# Patient Record
Sex: Male | Born: 1955
Health system: Southern US, Community
[De-identification: ages and names within clinical notes are randomized; demographics above are authoritative.]

## PROBLEM LIST (undated history)

## (undated) DIAGNOSIS — E7800 Pure hypercholesterolemia, unspecified: Secondary | ICD-10-CM

## (undated) DIAGNOSIS — I10 Essential (primary) hypertension: Secondary | ICD-10-CM

## (undated) DIAGNOSIS — Z86010 Personal history of colonic polyps: Secondary | ICD-10-CM

## (undated) DIAGNOSIS — B1920 Unspecified viral hepatitis C without hepatic coma: Secondary | ICD-10-CM

## (undated) DIAGNOSIS — K219 Gastro-esophageal reflux disease without esophagitis: Secondary | ICD-10-CM

## (undated) DIAGNOSIS — I2510 Atherosclerotic heart disease of native coronary artery without angina pectoris: Secondary | ICD-10-CM

## (undated) DIAGNOSIS — I499 Cardiac arrhythmia, unspecified: Secondary | ICD-10-CM

## (undated) DIAGNOSIS — H269 Unspecified cataract: Secondary | ICD-10-CM

## (undated) DIAGNOSIS — M1990 Unspecified osteoarthritis, unspecified site: Secondary | ICD-10-CM

## (undated) DIAGNOSIS — I219 Acute myocardial infarction, unspecified: Secondary | ICD-10-CM

## (undated) HISTORY — DX: Unspecified osteoarthritis, unspecified site: M19.90

## (undated) HISTORY — DX: Gastro-esophageal reflux disease without esophagitis: K21.9

## (undated) HISTORY — PX: CARDIAC CATHETERIZATION: SHX172

## (undated) HISTORY — DX: Unspecified cataract: H26.9

## (undated) HISTORY — PX: CORONARY ARTERY BYPASS GRAFT: SHX141

## (undated) HISTORY — PX: CARDIAC SURGERY: SHX584

## (undated) HISTORY — PX: COLONOSCOPY: SHX174

## (undated) HISTORY — DX: Personal history of colonic polyps: Z86.010

---

## 1998-10-08 HISTORY — PX: CORONARY ARTERY BYPASS GRAFT: SHX141

## 2009-04-18 ENCOUNTER — Emergency Department (HOSPITAL_COMMUNITY): Admission: EM | Admit: 2009-04-18 | Discharge: 2009-04-18 | Payer: Self-pay | Admitting: Family Medicine

## 2009-12-18 ENCOUNTER — Emergency Department (HOSPITAL_COMMUNITY): Admission: EM | Admit: 2009-12-18 | Discharge: 2009-12-18 | Payer: Self-pay | Admitting: Emergency Medicine

## 2011-01-01 LAB — POCT I-STAT, CHEM 8
BUN: 20 mg/dL (ref 6–23)
Calcium, Ion: 1.06 mmol/L — ABNORMAL LOW (ref 1.12–1.32)
Creatinine, Ser: 1.1 mg/dL (ref 0.4–1.5)
Glucose, Bld: 88 mg/dL (ref 70–99)
HCT: 36 % — ABNORMAL LOW (ref 39.0–52.0)
Hemoglobin: 12.2 g/dL — ABNORMAL LOW (ref 13.0–17.0)
Sodium: 141 mEq/L (ref 135–145)
TCO2: 26 mmol/L (ref 0–100)

## 2011-01-01 LAB — CBC: HCT: 35.8 % — ABNORMAL LOW (ref 39.0–52.0)

## 2011-01-01 LAB — POCT CARDIAC MARKERS
CKMB, poc: <1 ng/mL — ABNORMAL LOW (ref 1.0–8.0)
Troponin i, poc: <0.05 ng/mL (ref 0.00–0.09)
Troponin i, poc: <0.05 ng/mL (ref 0.00–0.09)

## 2011-01-14 LAB — BODY FLUID CULTURE: Culture: NO GROWTH

## 2011-01-14 LAB — GRAM STAIN

## 2011-01-14 LAB — SYNOVIAL CELL COUNT + DIFF, W/ CRYSTALS: Monocyte-Macrophage-Synovial Fluid: 17 % — ABNORMAL LOW (ref 50–90)

## 2011-01-14 LAB — URIC ACID: Uric Acid, Serum: 8 mg/dL — ABNORMAL HIGH (ref 4.0–7.8)

## 2013-03-12 ENCOUNTER — Other Ambulatory Visit (HOSPITAL_COMMUNITY): Payer: Self-pay | Admitting: Family Medicine

## 2013-03-12 DIAGNOSIS — N508 Other specified disorders of male genital organs: Secondary | ICD-10-CM

## 2013-03-12 DIAGNOSIS — N5089 Other specified disorders of the male genital organs: Secondary | ICD-10-CM

## 2013-03-13 ENCOUNTER — Ambulatory Visit (HOSPITAL_COMMUNITY)
Admission: RE | Admit: 2013-03-13 | Discharge: 2013-03-13 | Disposition: A | Discharge status: Home / Self Care | Payer: 59 | Source: Ambulatory Visit | Attending: Family Medicine | Admitting: Family Medicine

## 2013-03-13 DIAGNOSIS — N433 Hydrocele, unspecified: Secondary | ICD-10-CM | POA: Insufficient documentation

## 2013-03-13 DIAGNOSIS — N508 Other specified disorders of male genital organs: Secondary | ICD-10-CM | POA: Insufficient documentation

## 2013-03-13 DIAGNOSIS — N5089 Other specified disorders of the male genital organs: Secondary | ICD-10-CM

## 2013-10-29 ENCOUNTER — Encounter: Payer: Self-pay | Admitting: Cardiology

## 2013-12-09 ENCOUNTER — Other Ambulatory Visit: Payer: Self-pay | Admitting: Cardiology

## 2013-12-17 ENCOUNTER — Ambulatory Visit: Payer: 59 | Admitting: Cardiology

## 2014-04-19 ENCOUNTER — Other Ambulatory Visit: Payer: Self-pay | Admitting: Cardiology

## 2014-05-21 ENCOUNTER — Encounter: Payer: Self-pay | Admitting: Radiology

## 2014-05-21 ENCOUNTER — Other Ambulatory Visit: Payer: Self-pay | Admitting: Internal Medicine

## 2014-05-21 ENCOUNTER — Encounter (HOSPITAL_COMMUNITY): Payer: Self-pay | Admitting: Emergency Medicine

## 2014-05-21 ENCOUNTER — Emergency Department (HOSPITAL_COMMUNITY)
Admission: EM | Admit: 2014-05-21 | Discharge: 2014-05-21 | Disposition: A | Discharge status: Home / Self Care | Payer: 59 | Attending: Emergency Medicine | Admitting: Emergency Medicine

## 2014-05-21 ENCOUNTER — Encounter (INDEPENDENT_AMBULATORY_CARE_PROVIDER_SITE_OTHER): Payer: 59

## 2014-05-21 ENCOUNTER — Emergency Department (HOSPITAL_COMMUNITY): Payer: 59

## 2014-05-21 DIAGNOSIS — R001 Bradycardia, unspecified: Secondary | ICD-10-CM

## 2014-05-21 DIAGNOSIS — I493 Ventricular premature depolarization: Secondary | ICD-10-CM

## 2014-05-21 DIAGNOSIS — R42 Dizziness and giddiness: Secondary | ICD-10-CM | POA: Diagnosis present

## 2014-05-21 DIAGNOSIS — Z7982 Long term (current) use of aspirin: Secondary | ICD-10-CM | POA: Insufficient documentation

## 2014-05-21 DIAGNOSIS — R55 Syncope and collapse: Secondary | ICD-10-CM | POA: Insufficient documentation

## 2014-05-21 DIAGNOSIS — E785 Hyperlipidemia, unspecified: Secondary | ICD-10-CM | POA: Diagnosis present

## 2014-05-21 DIAGNOSIS — E7800 Pure hypercholesterolemia, unspecified: Secondary | ICD-10-CM | POA: Diagnosis not present

## 2014-05-21 DIAGNOSIS — I252 Old myocardial infarction: Secondary | ICD-10-CM | POA: Diagnosis not present

## 2014-05-21 DIAGNOSIS — Z79899 Other long term (current) drug therapy: Secondary | ICD-10-CM | POA: Diagnosis not present

## 2014-05-21 DIAGNOSIS — I10 Essential (primary) hypertension: Secondary | ICD-10-CM | POA: Diagnosis not present

## 2014-05-21 DIAGNOSIS — I4949 Other premature depolarization: Secondary | ICD-10-CM | POA: Diagnosis not present

## 2014-05-21 DIAGNOSIS — I498 Other specified cardiac arrhythmias: Secondary | ICD-10-CM | POA: Insufficient documentation

## 2014-05-21 DIAGNOSIS — N2889 Other specified disorders of kidney and ureter: Secondary | ICD-10-CM | POA: Diagnosis present

## 2014-05-21 DIAGNOSIS — I2510 Atherosclerotic heart disease of native coronary artery without angina pectoris: Secondary | ICD-10-CM | POA: Diagnosis present

## 2014-05-21 DIAGNOSIS — N182 Chronic kidney disease, stage 2 (mild): Secondary | ICD-10-CM | POA: Diagnosis present

## 2014-05-21 HISTORY — DX: Acute myocardial infarction, unspecified: I21.9

## 2014-05-21 HISTORY — DX: Essential (primary) hypertension: I10

## 2014-05-21 HISTORY — DX: Pure hypercholesterolemia, unspecified: E78.00

## 2014-05-21 LAB — CBC WITH DIFFERENTIAL/PLATELET
BASOS PCT: 0 % (ref 0–1)
Basophils Absolute: 0 10*3/uL (ref 0.0–0.1)
EOS ABS: 0.2 10*3/uL (ref 0.0–0.7)
EOS PCT: 3 % (ref 0–5)
HEMATOCRIT: 39.2 % (ref 39.0–52.0)
HEMOGLOBIN: 13.7 g/dL (ref 13.0–17.0)
LYMPHS PCT: 42 % (ref 12–46)
Lymphs Abs: 2.4 10*3/uL (ref 0.7–4.0)
MCH: 28.7 pg (ref 26.0–34.0)
MCHC: 34.9 g/dL (ref 30.0–36.0)
MCV: 82.2 fL (ref 78.0–100.0)
Monocytes Absolute: 0.8 10*3/uL (ref 0.1–1.0)
Monocytes Relative: 14 % — ABNORMAL HIGH (ref 3–12)
NEUTROS ABS: 2.3 10*3/uL (ref 1.7–7.7)
NEUTROS PCT: 41 % — AB (ref 43–77)
PLATELETS: 174 10*3/uL (ref 150–400)
RBC: 4.77 MIL/uL (ref 4.22–5.81)
RDW: 13.2 % (ref 11.5–15.5)
WBC: 5.6 10*3/uL (ref 4.0–10.5)

## 2014-05-21 LAB — COMPREHENSIVE METABOLIC PANEL
ALT: 32 U/L (ref 0–53)
ANION GAP: 11 (ref 5–15)
AST: 43 U/L — ABNORMAL HIGH (ref 0–37)
Albumin: 4.2 g/dL (ref 3.5–5.2)
Alkaline Phosphatase: 64 U/L (ref 39–117)
BILIRUBIN TOTAL: 0.5 mg/dL (ref 0.3–1.2)
BUN: 21 mg/dL (ref 6–23)
CHLORIDE: 100 meq/L (ref 96–112)
CO2: 26 mEq/L (ref 19–32)
CREATININE: 1.21 mg/dL (ref 0.50–1.35)
Calcium: 9.7 mg/dL (ref 8.4–10.5)
GFR, EST AFRICAN AMERICAN: 75 mL/min — AB (ref >90)
GFR, EST NON AFRICAN AMERICAN: 65 mL/min — AB (ref >90)
GLUCOSE: 100 mg/dL — AB (ref 70–99)
Potassium: 4.1 mEq/L (ref 3.7–5.3)
Sodium: 137 mEq/L (ref 137–147)
TOTAL PROTEIN: 8.3 g/dL (ref 6.0–8.3)

## 2014-05-21 LAB — RAPID URINE DRUG SCREEN, HOSP PERFORMED
AMPHETAMINES: NOT DETECTED
BARBITURATES: NOT DETECTED
Benzodiazepines: NOT DETECTED
COCAINE: NOT DETECTED
OPIATES: NOT DETECTED
Tetrahydrocannabinol: POSITIVE — AB

## 2014-05-21 LAB — I-STAT TROPONIN, ED: TROPONIN I, POC: 0.01 ng/mL (ref 0.00–0.08)

## 2014-05-21 NOTE — ED Notes (Signed)
Patient placed on defibrilator pads and monitor. Will continue to monitor rhythm and rate.

## 2014-05-21 NOTE — Progress Notes (Signed)
Patient ID: Nicholas [name], male   DOB: [birth year], 58 y.o.   MRN: [id number] lifewatch 30 day monitor applied. EOS 06-20-14

## 2014-05-21 NOTE — ED Provider Notes (Signed)
CSN: [id number]     Arrival date & time 05/21/14  0830 History   First MD Initiated Contact with Patient 05/21/14 [phone]     Chief Complaint  Patient presents with  . Dizziness     (Consider location/radiation/quality/duration/timing/severity/associated sxs/prior Treatment) The history is provided by the patient.  Nicholas [name] is a 58 y.o. male hx of MI, HTN, HL here with dizziness. He bends down often this morning as part of his job. He noticed that when he bends down, he feels light headed and dizzy and almost passed out. Had some left sided chest pressure for the last 2 days, no pain today. No shortness of breath. No vertigo symptoms. Smoked some marijuana today. Denies cocaine use. Had MI x 2 with CABG.    Past Medical History  Diagnosis Date  . Myocardial infarction   . Hypertension   . Hypercholesteremia    Past Surgical History  Procedure Laterality Date  . Cardiac surgery      triple bypass   No family history on file. History  Substance Use Topics  . Smoking status: Never Smoker   . Smokeless tobacco: Not on file  . Alcohol Use: Yes     Comment: social    Review of Systems  Cardiovascular: Positive for chest pain.  Neurological: Positive for dizziness.  All other systems reviewed and are negative.     Allergies  Review of patient's allergies indicates no known allergies.  Home Medications   Prior to Admission medications   Medication Sig Start Date End Date Taking? Authorizing Provider  alfuzosin (UROXATRAL) 10 MG 24 hr tablet Take 10 mg by mouth every evening.   Yes Historical Provider, MD  aspirin EC 81 MG tablet Take 81 mg by mouth daily with breakfast.   Yes Historical Provider, MD  enalapril (VASOTEC) 10 MG tablet Take 5 mg by mouth daily.   Yes Historical Provider, MD  hydrochlorothiazide (HYDRODIURIL) 25 MG tablet Take 12.5 mg by mouth daily.   Yes Historical Provider, MD  simvastatin (ZOCOR) 40 MG tablet Take 40 mg by mouth every evening.   Yes  Historical Provider, MD   BP 115/78  Pulse 36  Temp(Src) 97.5 F (36.4 C)  Resp 16  SpO2 99% Physical Exam  Nursing note and vitals reviewed. Constitutional: He is oriented to person, place, and time. He appears well-developed and well-nourished.  HENT:  Head: Normocephalic.  Mouth/Throat: Oropharynx is clear and moist.  Eyes: Conjunctivae and EOM are normal. Pupils are equal, round, and reactive to light.  Neck: Normal range of motion. Neck supple.  Cardiovascular: Regular rhythm and normal heart sounds.   Bradycardic   Pulmonary/Chest: Effort normal and breath sounds normal. No respiratory distress. He has no wheezes. He has no rales.  Abdominal: Soft. Bowel sounds are normal. He exhibits no distension. There is no tenderness. There is no rebound and no guarding.  Musculoskeletal: Normal range of motion. He exhibits no edema and no tenderness.  Neurological: He is alert and oriented to person, place, and time. No cranial nerve deficit. Coordination normal.  Skin: Skin is warm and dry.  Psychiatric: He has a normal mood and affect. His behavior is normal. Judgment and thought content normal.    ED Course  Procedures (including critical care time) Labs Review Labs Reviewed  CBC WITH DIFFERENTIAL - Abnormal; Notable for the following:    Neutrophils Relative % 41 (*)    Monocytes Relative 14 (*)    All other components within normal limits  COMPREHENSIVE METABOLIC PANEL - Abnormal; Notable for the following:    Glucose, Bld 100 (*)    AST 43 (*)    GFR calc non Af Amer 65 (*)    GFR calc Af Amer 75 (*)    All other components within normal limits  URINE RAPID DRUG SCREEN (HOSP PERFORMED) - Abnormal; Notable for the following:    Tetrahydrocannabinol POSITIVE (*)    All other components within normal limits  I-STAT TROPOININ, ED    Imaging Review Dg Chest 2 View  05/21/2014   CLINICAL DATA:  Dizziness.  EXAM: CHEST  2 VIEW  COMPARISON:  12/18/2009  FINDINGS: Heart size  and pulmonary vascularity are normal and the lungs are clear. No effusions. No osseous abnormality. CABG.  IMPRESSION: No active cardiopulmonary disease.   Electronically Signed   By: Rozetta Nunnery M.D.   On: 05/21/2014 09:55     EKG Interpretation   Date/Time:  Friday May 21 2014 08:51:54 EDT Ventricular Rate:  96 PR Interval:  168 QRS Duration: 121 QT Interval:  409 QTC Calculation: 517 R Axis:   48 Text Interpretation:  Sinus rhythm Paired ventricular premature complexes  Biatrial enlargement Nonspecific intraventricular conduction delay  Nonspecific T abnormalities, inferior leads Baseline wander in lead(s) V1  V3 PVCs new since previous  Confirmed by Ameya Vowell  MD, Sahily Biddle ([id number]) on  05/21/2014 8:54:38 AM      MDM   Final diagnoses:  None   Nicholas [name] is a 58 y.o. male here with dizziness. Noted to be bradycardic. I am concerned for ACS vs symptomatic bradycardic given hx of MI. Will likely need cardiology eval and admission. Not hypotensive so will hold off on meds for bradycardia.   10:42 AM Placed on pacer pads. Labs unremarkable. Trop neg x 1. UDS + marijuana. Consulted cardiology for eval for ACS vs symptomatic bradycardia.   2:58 PM Cardiology at bedside, will admit.    Wandra Arthurs, MD 05/21/14 [phone]

## 2014-05-21 NOTE — ED Notes (Signed)
Dr. Darl Householder notified that patient is waiting for cardiology consult.

## 2014-05-21 NOTE — ED Notes (Signed)
Patient waiting on cardiology to consult.

## 2014-05-21 NOTE — H&P (Signed)
Patient ID: Nicholas [name] MRN: [id number], DOB/AGE: 06-11-1956   Admit date: 05/21/2014   Primary Physician: Donnie Coffin, MD Primary Cardiologist: Dr Marlou Porch  HPI: Pleasant 58 y/o AA male, s/p CAG x 3 in [place] in 2003. He has been followed by Dr Marlou Porch since 2008. He sees him on a regular basis, last saw him about 6 months ago (although there are no records in Epic?). The pt says he had a stress test a year ago that was OK. He came to the ER today after he became dizzy when bending over. This was new for him. He also had some Lt lateral, localized chest pain. In the ER his HR is in the 50s and he has frequent PVCs, bigeminy, and couplets. He denies syncope. He tells me he has always had an "irregular HR".    Problem List: Past Medical History  Diagnosis Date  . Myocardial infarction   . Hypertension   . Hypercholesteremia     Past Surgical History  Procedure Laterality Date  . Cardiac surgery      triple bypass  . Coronary artery bypass graft      CABG x 3 Vessels     Allergies: No Known Allergies   Home Medications No current facility-administered medications for this encounter.   Current Outpatient Prescriptions  Medication Sig Dispense Refill  . alfuzosin (UROXATRAL) 10 MG 24 hr tablet Take 10 mg by mouth every evening.      Marland Kitchen aspirin EC 81 MG tablet Take 81 mg by mouth daily with breakfast.      . enalapril (VASOTEC) 10 MG tablet Take 5 mg by mouth daily.      . hydrochlorothiazide (HYDRODIURIL) 25 MG tablet Take 12.5 mg by mouth daily.      . simvastatin (ZOCOR) 40 MG tablet Take 40 mg by mouth every evening.         Family History  Problem Relation Age of Onset  . Diabetic kidney disease Mother   . Kidney disease Mother   . Throat cancer Mother   . Other Father     unknown     History   Social History  . Marital Status: Married    Spouse Name: N/A    Number of Children: N/A  . Years of Education: N/A   Occupational History  . Not on  file.   Social History Main Topics  . Smoking status: Never Smoker   . Smokeless tobacco: Never Used  . Alcohol Use: Yes     Comment: social  . Drug Use: Yes    Special: Marijuana  . Sexual Activity: Yes   Other Topics Concern  . Not on file   Social History Narrative  . No narrative on file     Review of Systems: General: negative for chills, fever, night sweats or weight changes.  Cardiovascular: negative for chest pain, dyspnea on exertion, edema, orthopnea, palpitations, paroxysmal nocturnal dyspnea or shortness of breath Dermatological: negative for rash Respiratory: negative for cough or wheezing Urologic: negative for hematuria Abdominal: negative for nausea, vomiting, diarrhea, bright red blood per rectum, melena, or hematemesis Neurologic: negative for visual changes, syncope, or dizziness All other systems reviewed and are otherwise negative except as noted above.  Physical Exam: Blood pressure 118/79, pulse 36, temperature 97.5 F (36.4 C), resp. rate 16, SpO2 100.00%.  General appearance: alert, cooperative and no distress Neck: no carotid bruit and no JVD Lungs: clear to auscultation bilaterally Heart: regular rate and rhythm,  S1, S2 normal, no murmur, click, rub or gallop Abdomen: soft, non-tender; bowel sounds normal; no masses,  no organomegaly Extremities: extremities normal, atraumatic, no cyanosis or edema Pulses: 2+ and symmetric Skin: Skin color, texture, turgor normal. No rashes or lesions Neurologic: Grossly normal    Labs:   Results for orders placed during the hospital encounter of 05/21/14 (from the past 24 hour(s))  CBC WITH DIFFERENTIAL     Status: Abnormal   Collection Time    05/21/14  8:49 AM      Result Value Ref Range   WBC 5.6  4.0 - 10.5 K/uL   RBC 4.77  4.22 - 5.81 MIL/uL   Hemoglobin 13.7  13.0 - 17.0 g/dL   HCT 39.2  39.0 - 52.0 %   MCV 82.2  78.0 - 100.0 fL   MCH 28.7  26.0 - 34.0 pg   MCHC 34.9  30.0 - 36.0 g/dL   RDW  13.2  11.5 - 15.5 %   Platelets 174  150 - 400 K/uL   Neutrophils Relative % 41 (*) 43 - 77 %   Neutro Abs 2.3  1.7 - 7.7 K/uL   Lymphocytes Relative 42  12 - 46 %   Lymphs Abs 2.4  0.7 - 4.0 K/uL   Monocytes Relative 14 (*) 3 - 12 %   Monocytes Absolute 0.8  0.1 - 1.0 K/uL   Eosinophils Relative 3  0 - 5 %   Eosinophils Absolute 0.2  0.0 - 0.7 K/uL   Basophils Relative 0  0 - 1 %   Basophils Absolute 0.0  0.0 - 0.1 K/uL  COMPREHENSIVE METABOLIC PANEL     Status: Abnormal   Collection Time    05/21/14  8:49 AM      Result Value Ref Range   Sodium 137  137 - 147 mEq/L   Potassium 4.1  3.7 - 5.3 mEq/L   Chloride 100  96 - 112 mEq/L   CO2 26  19 - 32 mEq/L   Glucose, Bld 100 (*) 70 - 99 mg/dL   BUN 21  6 - 23 mg/dL   Creatinine, Ser 1.21  0.50 - 1.35 mg/dL   Calcium 9.7  8.4 - 10.5 mg/dL   Total Protein 8.3  6.0 - 8.3 g/dL   Albumin 4.2  3.5 - 5.2 g/dL   AST 43 (*) 0 - 37 U/L   ALT 32  0 - 53 U/L   Alkaline Phosphatase 64  39 - 117 U/L   Total Bilirubin 0.5  0.3 - 1.2 mg/dL   GFR calc non Af Amer 65 (*) >90 mL/min   GFR calc Af Amer 75 (*) >90 mL/min   Anion gap 11  5 - 15  URINE RAPID DRUG SCREEN (HOSP PERFORMED)     Status: Abnormal   Collection Time    05/21/14  9:37 AM      Result Value Ref Range   Opiates NONE DETECTED  NONE DETECTED   Cocaine NONE DETECTED  NONE DETECTED   Benzodiazepines NONE DETECTED  NONE DETECTED   Amphetamines NONE DETECTED  NONE DETECTED   Tetrahydrocannabinol POSITIVE (*) NONE DETECTED   Barbiturates NONE DETECTED  NONE DETECTED  I-STAT TROPOININ, ED     Status: None   Collection Time    05/21/14 10:04 AM      Result Value Ref Range   Troponin i, poc 0.01  0.00 - 0.08 ng/mL   Comment 3  Radiology/Studies: Dg Chest 2 View  05/21/2014   CLINICAL DATA:  Dizziness.  EXAM: CHEST  2 VIEW  COMPARISON:  12/18/2009  FINDINGS: Heart size and pulmonary vascularity are normal and the lungs are clear. No effusions. No osseous abnormality.  CABG.  IMPRESSION: No active cardiopulmonary disease.   Electronically Signed   By: Rozetta Nunnery M.D.   On: 05/21/2014 09:55    EKG:NSR, SB, PVCs, Bigeminy, couplets  ASSESSMENT AND PLAN:  Active Problems:   Dizziness when bending over   Frequent PVCs   Sinus bradycardia   CAD- CABG x 3 in [place] '03   Chronic renal insufficiency, stage II (mild)   Dyslipidemia   PLAN: Troponin negative x 1. He feels fine now. He is not on a beta blocker. B/P was not orthostatic. ? Home with OP event. I'm not sure why there are no records in Epic from office visits or OP testing. MD to see.     Henri Medal, PA-C 05/21/2014, 2:42 PM

## 2014-05-21 NOTE — ED Notes (Addendum)
Pt from home c/o dizziness when he bends over. Pt reports hx of MI in 2003. Pt states that he has been SOB since MI, but denies CP. Pt also denies N/V/D/F. Pt is A&O and in NAD. Pt has manual HR of 36, is asymptomatic and reports that he "smoked a joint this morning." Pt also adds that when he is dehydrated, he feels dizzy

## 2014-05-21 NOTE — Discharge Instructions (Signed)
Bradycardia Bradycardia is a term for a heart rate (pulse) that, in adults, is slower than 60 beats per minute. A normal rate is 60 to 100 beats per minute. A heart rate below 60 beats per minute may be normal for some adults with healthy hearts. If the rate is too slow, the heart may have trouble pumping the volume of blood the body needs. If the heart rate gets too low, blood flow to the brain may be decreased and may make you feel lightheaded, dizzy, or faint. The heart has a natural pacemaker in the top of the heart called the SA node (sinoatrial or sinus node). This pacemaker sends out regular electrical signals to the muscle of the heart, telling the heart muscle when to beat (contract). The electrical signal travels from the upper parts of the heart (atria) through the AV node (atrioventricular node), to the lower chambers of the heart (ventricles). The ventricles squeeze, pumping the blood from your heart to your lungs and to the rest of your body. CAUSES   Problem with the heart's electrical system.  Problem with the heart's natural pacemaker.  Heart disease, damage, or infection.  Medications.  Problems with minerals and salts (electrolytes). SYMPTOMS   Fainting (syncope).  Fatigue and weakness.  Shortness of breath (dyspnea).  Chest pain (angina).  Drowsiness.  Confusion. DIAGNOSIS   An electrocardiogram (ECG) can help your caregiver determine the type of slow heart rate you have.  If the cause is not seen on an ECG, you may need to wear a heart monitor that records your heart rhythm for several hours or days.  Blood tests. TREATMENT   Electrolyte supplements.  Medications.  Withholding medication which is causing a slow heart rate.  Pacemaker placement. SEEK IMMEDIATE MEDICAL CARE IF:   You feel lightheaded or faint.  You develop an irregular heart rate.  You feel chest pain or have trouble breathing. MAKE SURE YOU:   Understand these  instructions.  Will watch your condition.  Will get help right away if you are not doing well or get worse. Document Released: 06/16/2002 Document Revised: 12/17/2011 Document Reviewed: 12/30/2013 [facility] Patient Information 2015 [place], [place]. This information is not intended to replace advice given to you by your health care provider. Make sure you discuss any questions you have with your health care provider.   [place] [id number] [place] [address] Phone: [phone] Fax: [phone]  May 21, 2014  Patient: Nicholas [name]  Date of Birth: [birth year]  Date of Visit: 05/21/2014    To Whom It May Concern:  Abb Gobert was seen and treated in our emergency department on 05/21/2014. [name]  may return to work on 05/24/14.  Sincerely,

## 2014-05-21 NOTE — ED Provider Notes (Signed)
Dr Debara Pickett evaluated the patient. Pt will be followed up as an outpatient.  Safe for discharge.  Dorie Rank, MD 05/21/14 [phone]

## 2014-05-21 NOTE — H&P (Signed)
Pt. Seen and examined. Agree with the NP/PA-C note as written. Pleasant 58 yo male with history of CABG x 3 in [place] in 2003. He has followed with Dr. Marlou Porch, but has not seen him since he moved to the [address] office. Today he noted some dizziness with change in position and fatigue at work. Apparently he used marijuana this morning. EKG on arrival shows bradycardia with HR in the upper 40's-60's with some PVC's.  He denies chest pain. Initial troponin is negative. He is not on any AVN blocking agents. It is not know what his baseline HR is.   His symptoms could be due to bradycardia or chronotropic incompetence. Unclear if this is related to ischemia, but he is not having chest pain and has a negative troponin. I would recommend a 2 week event monitor.  I have called the [facility] street office and arranged that to be placed today. I have scheduled a follow-up appointment with the next available provider Kerin Ransom) on 9/2 at 8 am.  He would likely need an outpatient stress test to rule-out ischemia - an exercise myoview would also help to evaluate for chronotropic incompetence.  Pixie Casino, MD, [facility] Attending Cardiologist [place]

## 2014-05-21 NOTE — ED Notes (Signed)
Dr. Darl Householder notified that patient is waiting on cardiology.

## 2014-05-21 NOTE — ED Notes (Signed)
Pt. Is not able to use the restroom at this but is aware that we need a urine specimen.

## 2014-05-21 NOTE — ED Notes (Signed)
Provided pt with Sprite - Per Dr Darl Householder pt can eat/drink

## 2014-05-24 ENCOUNTER — Other Ambulatory Visit: Payer: Self-pay | Admitting: Cardiology

## 2014-05-26 ENCOUNTER — Telehealth: Payer: Self-pay | Admitting: Cardiology

## 2014-05-26 NOTE — Telephone Encounter (Signed)
Walk In Pt Form " FMLA" Dropped Off gave to Pam 8.20.15/km

## 2014-05-27 ENCOUNTER — Telehealth: Payer: Self-pay | Admitting: Cardiology

## 2014-05-27 DIAGNOSIS — I498 Other specified cardiac arrhythmias: Secondary | ICD-10-CM

## 2014-05-27 DIAGNOSIS — I25810 Atherosclerosis of coronary artery bypass graft(s) without angina pectoris: Secondary | ICD-10-CM

## 2014-05-27 NOTE — Telephone Encounter (Signed)
F/u    Calling about previous message.

## 2014-05-27 NOTE — Telephone Encounter (Signed)
New problem   Pt was seen last week and pt not feeling well today and want to come in to see doctor today b/c of an almost fainting spell at work last night.

## 2014-05-27 NOTE — Telephone Encounter (Signed)
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  Pt is wearing an event monitor and tracing from this am demonstrated NSR with 1 PVC.  Will review with Dr Marlou Porch tomorrow.  Per wife - pt is to be seen sooner than his scheduled appt (06/10/2014) if there is a cancellation.

## 2014-05-28 NOTE — Telephone Encounter (Signed)
Left message for pt to call back - per Dr Marlou Porch he should have a stress myoview.

## 2014-05-31 NOTE — Telephone Encounter (Signed)
Left message with pt's wife to have him call back to discuss need for stress testing, instructions and to schedule.

## 2014-05-31 NOTE — Telephone Encounter (Signed)
Follow up ° ° ° ° ° ° ° ° ° °Pt returning nurses call °

## 2014-05-31 NOTE — Telephone Encounter (Signed)
Reviewed need for and instructions for myoview stress test as ordered by Dr Marlou Porch.  Pt states understanding.  Aware I will have the scheduler to call him with an appointment date and time.

## 2014-06-01 ENCOUNTER — Encounter: Payer: Self-pay | Admitting: *Deleted

## 2014-06-04 ENCOUNTER — Telehealth: Payer: Self-pay | Admitting: *Deleted

## 2014-06-04 NOTE — Telephone Encounter (Signed)
Noted! Thank you

## 2014-06-04 NOTE — Telephone Encounter (Signed)
Several message were left on 8/26,8/27 and 06/04/2014 for  Nicholas [name] to call and schedule his test.

## 2014-06-08 DIAGNOSIS — I499 Cardiac arrhythmia, unspecified: Secondary | ICD-10-CM

## 2014-06-08 HISTORY — DX: Cardiac arrhythmia, unspecified: I49.9

## 2014-06-09 ENCOUNTER — Telehealth: Payer: Self-pay | Admitting: Interventional Cardiology

## 2014-06-09 ENCOUNTER — Ambulatory Visit: Payer: 59 | Admitting: Cardiology

## 2014-06-09 NOTE — Telephone Encounter (Signed)
Dr.Skains pt routed to his nurse Jeannene Patella F.,RN

## 2014-06-09 NOTE — Telephone Encounter (Signed)
New Message  Pt wife called back to discuss FMLA forms. Please call

## 2014-06-10 ENCOUNTER — Encounter: Payer: Self-pay | Admitting: Physician Assistant

## 2014-06-10 ENCOUNTER — Ambulatory Visit: Payer: 59 | Admitting: Physician Assistant

## 2014-06-10 ENCOUNTER — Ambulatory Visit (INDEPENDENT_AMBULATORY_CARE_PROVIDER_SITE_OTHER): Payer: 59 | Admitting: Physician Assistant

## 2014-06-10 ENCOUNTER — Encounter (HOSPITAL_COMMUNITY): Payer: Self-pay | Admitting: General Practice

## 2014-06-10 ENCOUNTER — Observation Stay (HOSPITAL_COMMUNITY)
Admission: AD | Admit: 2014-06-10 | Discharge: 2014-06-12 | Disposition: A | Discharge status: Home / Self Care | Payer: 59 | Source: Ambulatory Visit | Attending: Cardiology | Admitting: Cardiology

## 2014-06-10 VITALS — BP 110/68 | HR 66 | Ht 71.0 in | Wt 177.0 lb

## 2014-06-10 DIAGNOSIS — R55 Syncope and collapse: Secondary | ICD-10-CM

## 2014-06-10 DIAGNOSIS — F1210 Cannabis abuse, uncomplicated: Secondary | ICD-10-CM | POA: Diagnosis not present

## 2014-06-10 DIAGNOSIS — N2889 Other specified disorders of kidney and ureter: Secondary | ICD-10-CM | POA: Diagnosis present

## 2014-06-10 DIAGNOSIS — E7800 Pure hypercholesterolemia, unspecified: Secondary | ICD-10-CM | POA: Diagnosis not present

## 2014-06-10 DIAGNOSIS — Z87891 Personal history of nicotine dependence: Secondary | ICD-10-CM | POA: Insufficient documentation

## 2014-06-10 DIAGNOSIS — E785 Hyperlipidemia, unspecified: Secondary | ICD-10-CM | POA: Diagnosis present

## 2014-06-10 DIAGNOSIS — I252 Old myocardial infarction: Secondary | ICD-10-CM | POA: Insufficient documentation

## 2014-06-10 DIAGNOSIS — N182 Chronic kidney disease, stage 2 (mild): Secondary | ICD-10-CM | POA: Diagnosis not present

## 2014-06-10 DIAGNOSIS — R0989 Other specified symptoms and signs involving the circulatory and respiratory systems: Secondary | ICD-10-CM

## 2014-06-10 DIAGNOSIS — R001 Bradycardia, unspecified: Secondary | ICD-10-CM | POA: Diagnosis present

## 2014-06-10 DIAGNOSIS — R42 Dizziness and giddiness: Secondary | ICD-10-CM | POA: Diagnosis present

## 2014-06-10 DIAGNOSIS — Z7982 Long term (current) use of aspirin: Secondary | ICD-10-CM | POA: Diagnosis not present

## 2014-06-10 DIAGNOSIS — I2510 Atherosclerotic heart disease of native coronary artery without angina pectoris: Secondary | ICD-10-CM | POA: Diagnosis not present

## 2014-06-10 DIAGNOSIS — Z79899 Other long term (current) drug therapy: Secondary | ICD-10-CM | POA: Insufficient documentation

## 2014-06-10 DIAGNOSIS — I129 Hypertensive chronic kidney disease with stage 1 through stage 4 chronic kidney disease, or unspecified chronic kidney disease: Secondary | ICD-10-CM | POA: Insufficient documentation

## 2014-06-10 DIAGNOSIS — I498 Other specified cardiac arrhythmias: Secondary | ICD-10-CM | POA: Diagnosis not present

## 2014-06-10 DIAGNOSIS — Z951 Presence of aortocoronary bypass graft: Secondary | ICD-10-CM | POA: Insufficient documentation

## 2014-06-10 DIAGNOSIS — R0609 Other forms of dyspnea: Secondary | ICD-10-CM

## 2014-06-10 DIAGNOSIS — I4949 Other premature depolarization: Secondary | ICD-10-CM | POA: Diagnosis not present

## 2014-06-10 DIAGNOSIS — I493 Ventricular premature depolarization: Secondary | ICD-10-CM | POA: Diagnosis present

## 2014-06-10 HISTORY — DX: Cardiac arrhythmia, unspecified: I49.9

## 2014-06-10 HISTORY — DX: Atherosclerotic heart disease of native coronary artery without angina pectoris: I25.10

## 2014-06-10 LAB — BASIC METABOLIC PANEL
Anion gap: 13 (ref 5–15)
BUN: 17 mg/dL (ref 6–23)
CALCIUM: 9.2 mg/dL (ref 8.4–10.5)
CO2: 23 mEq/L (ref 19–32)
CREATININE: 1.09 mg/dL (ref 0.50–1.35)
Chloride: 103 mEq/L (ref 96–112)
GFR, EST AFRICAN AMERICAN: 85 mL/min — AB (ref >90)
GFR, EST NON AFRICAN AMERICAN: 73 mL/min — AB (ref >90)
Glucose, Bld: 103 mg/dL — ABNORMAL HIGH (ref 70–99)
Potassium: 4.5 mEq/L (ref 3.7–5.3)
Sodium: 139 mEq/L (ref 137–147)

## 2014-06-10 LAB — CBC
HCT: 39.7 % (ref 39.0–52.0)
HEMOGLOBIN: 14 g/dL (ref 13.0–17.0)
MCH: 28.9 pg (ref 26.0–34.0)
MCHC: 35.3 g/dL (ref 30.0–36.0)
MCV: 81.9 fL (ref 78.0–100.0)
Platelets: 149 10*3/uL — ABNORMAL LOW (ref 150–400)
RBC: 4.85 MIL/uL (ref 4.22–5.81)
RDW: 13.1 % (ref 11.5–15.5)
WBC: 6.2 10*3/uL (ref 4.0–10.5)

## 2014-06-10 LAB — APTT: aPTT: 32 seconds (ref 24–37)

## 2014-06-10 LAB — PRO B NATRIURETIC PEPTIDE: PRO B NATRI PEPTIDE: 175.8 pg/mL — AB (ref 0–125)

## 2014-06-10 LAB — TROPONIN I

## 2014-06-10 LAB — MAGNESIUM: Magnesium: 2.2 mg/dL (ref 1.5–2.5)

## 2014-06-10 LAB — PROTIME-INR
INR: 1.03 (ref 0.00–1.49)
PROTHROMBIN TIME: 13.5 s (ref 11.6–15.2)

## 2014-06-10 LAB — TSH: TSH: 1.32 u[IU]/mL (ref 0.350–4.500)

## 2014-06-10 LAB — HEMOGLOBIN A1C
Hgb A1c MFr Bld: 6.4 % — ABNORMAL HIGH (ref <5.7)
Mean Plasma Glucose: 137 mg/dL — ABNORMAL HIGH (ref <117)

## 2014-06-10 MED ORDER — ASPIRIN EC 81 MG PO TBEC: 81.0000 mg | DELAYED_RELEASE_TABLET | Freq: Every day | ORAL | Status: DC

## 2014-06-10 MED ORDER — SODIUM CHLORIDE 0.9 % IV SOLN: 250.0000 mL | INTRAVENOUS | Status: DC | PRN

## 2014-06-10 MED ORDER — ALFUZOSIN HCL ER 10 MG PO TB24
10.0000 mg | ORAL_TABLET | Freq: Every evening | ORAL | Status: DC
  Administered 2014-06-10 – 2014-06-11 (×2): 10 mg via ORAL
  Filled 2014-06-10 (×3): qty 1

## 2014-06-10 MED ORDER — HEPARIN SODIUM (PORCINE) 5000 UNIT/ML IJ SOLN
5000.0000 [IU] | Freq: Three times a day (TID) | INTRAMUSCULAR | Status: DC
  Administered 2014-06-10 – 2014-06-12 (×5): 5000 [IU] via SUBCUTANEOUS
  Filled 2014-06-10 (×8): qty 1

## 2014-06-10 MED ORDER — SODIUM CHLORIDE 0.9 % IJ SOLN: 3.0000 mL | INTRAMUSCULAR | Status: DC | PRN

## 2014-06-10 MED ORDER — ONDANSETRON HCL 4 MG/2ML IJ SOLN: 4.0000 mg | Freq: Four times a day (QID) | INTRAMUSCULAR | Status: DC | PRN

## 2014-06-10 MED ORDER — ASPIRIN 81 MG PO CHEW: 324.0000 mg | CHEWABLE_TABLET | ORAL | Status: AC

## 2014-06-10 MED ORDER — HYDROCHLOROTHIAZIDE 25 MG PO TABS
12.5000 mg | ORAL_TABLET | Freq: Every day | ORAL | Status: DC
  Filled 2014-06-10: qty 0.5

## 2014-06-10 MED ORDER — NITROGLYCERIN 0.4 MG SL SUBL: 0.4000 mg | SUBLINGUAL_TABLET | SUBLINGUAL | Status: DC | PRN

## 2014-06-10 MED ORDER — ACETAMINOPHEN 325 MG PO TABS: 650.0000 mg | ORAL_TABLET | ORAL | Status: DC | PRN

## 2014-06-10 MED ORDER — ENALAPRIL MALEATE 5 MG PO TABS
5.0000 mg | ORAL_TABLET | Freq: Every day | ORAL | Status: DC
  Filled 2014-06-10: qty 1

## 2014-06-10 MED ORDER — SODIUM CHLORIDE 0.9 % IJ SOLN
3.0000 mL | Freq: Two times a day (BID) | INTRAMUSCULAR | Status: DC
  Administered 2014-06-11 (×2): 3 mL via INTRAVENOUS

## 2014-06-10 MED ORDER — ASPIRIN 300 MG RE SUPP: 300.0000 mg | RECTAL | Status: AC

## 2014-06-10 MED ORDER — ASPIRIN EC 81 MG PO TBEC
81.0000 mg | DELAYED_RELEASE_TABLET | Freq: Every day | ORAL | Status: DC
  Administered 2014-06-11 – 2014-06-12 (×2): 81 mg via ORAL
  Filled 2014-06-10 (×2): qty 1

## 2014-06-10 MED ORDER — SIMVASTATIN 40 MG PO TABS
40.0000 mg | ORAL_TABLET | Freq: Every evening | ORAL | Status: DC
  Administered 2014-06-10 – 2014-06-11 (×2): 40 mg via ORAL
  Filled 2014-06-10 (×3): qty 1

## 2014-06-10 NOTE — Progress Notes (Signed)
Cardiology Office Note    Date:  06/10/2014   ID:  Nicholas [name], DOB Jan 27, 1956, MRN [id number]  PCP:  Nicholas Coffin, MD  Cardiologist:  Dr. Marlou [name]    History of Present Illness: Nicholas [name] is a 58 y.o. male with a past medical history of HTN, HLD, CAD s/p CABG in 2003 who presents to the clinic for evaluation of pre-syncopal episodes with recent ED visit and review of a 2-week event monitor.   He is followed by Dr. Marlou [name] and was last seen in 06/2013 and there was documentation the he complained of some dizziness when he stood up. He had been doing well since that time but did have intermittent dizziness. He then presented to the [facility] ED on 05/21/14 with worsening dizziness and pre-syncope. Telemetry in the ED revealed sinus brady with frequent ectopy with PVCS and bigeminy. Orthostatics and all other lab work, including troponin, returned normal. He was not on any AVN blocking agents. He was seen by cardiology who thought that his symptoms could be related to bradycardia or chronotropic incompetence and recommended discharge with a 2 week event monitor and an outpatient stress test to rule-out ischemia. An exercise myoview was preferred to help to evaluate for chronotropic incompetence.  Today he complains of continued episodes of pre-syncope occuring daily, especially when he leans forward and when he gets emotionally worked up. Since his discharge from the ED he called in the office to see if he could be seen sooner due to continued episodes of pre-sycnope. He had to leave work last Thursday and has had to sit out of work for an hour or so on a couple occasions due to these symptoms. He also notes DOE lately. He cannot mow the lawn anymore due to being "tired out" and dyspnic. His last stress test was in 2013, which returned non-ischemic. He has had no chest pain. His chest pain prior to his CABG was indigestion like and he has not had anything reminiscent of this. Review of his 2 week event  monitor reveals sinus brady with frequent PVCs, bigeminy, couplets and several episodes of NSVT. He has not scheduled his nuclear stress test yet.   He denies LE edema, orthopnea or PND.  Studies:  - Echo (04/2009): EF 55-60% w/ basal to mid inferoseptal wall akinesis/ dyskinesis/ anyerism with otherwise normal contraction. Mild MR, trace AR. G1DD with elevated LA pressure. (EF improved from 40-45% at the time of CABG in 2003)  - Nuclear (10/31/11): Abnormal perfusion study with fixed old inferior basal infarction. EF 46%. No sig ischemia.     Recent Labs/Images: 05/21/2014: ALT 32; Creatinine 1.21; Hemoglobin 13.7; Potassium 4.1  Dg Chest 2 View  05/21/2014   CLINICAL DATA:  Dizziness.  EXAM: CHEST  2 VIEW  COMPARISON:  12/18/2009  FINDINGS: Heart size and pulmonary vascularity are normal and the lungs are clear. No effusions. No osseous abnormality. CABG.  IMPRESSION: No active cardiopulmonary disease.   Electronically Signed   By: Rozetta Nunnery M.D.   On: 05/21/2014 09:55     Wt Readings from Last 3 Encounters:  06/10/14 177 lb (80.287 kg)     Past Medical History  Diagnosis Date  . Myocardial infarction   . Hypertension   . Hypercholesteremia     Current Outpatient Prescriptions  Medication Sig Dispense Refill  . alfuzosin (UROXATRAL) 10 MG 24 hr tablet Take 10 mg by mouth every evening.      Marland Kitchen aspirin EC 81 MG tablet Take 81  mg by mouth daily with breakfast.      . enalapril (VASOTEC) 10 MG tablet Take 5 mg by mouth daily.      . hydrochlorothiazide (HYDRODIURIL) 25 MG tablet Take 12.5 mg by mouth daily.      . simvastatin (ZOCOR) 40 MG tablet Take 40 mg by mouth every evening.       No current facility-administered medications for this visit.     Allergies:   Review of patient's allergies indicates no known allergies.   Social History:  The patient  reports that he quit smoking about 12 years ago. He has never used smokeless tobacco. He reports that he drinks alcohol. He  reports that he uses illicit drugs (Marijuana).   Family History:  The patient's family history includes Cancer in his mother; Diabetes in his mother; Diabetic kidney disease in his mother; Hypertension in his father and mother; Kidney disease in his mother; Other in his father; Throat cancer in his mother. There is no history of Heart attack.   ROS:  Please see the history of present illness.  All other systems reviewed and negative.   PHYSICAL EXAM: VS:  BP 110/68  Pulse 66  Ht 5\' 11"  (1.803 m)  Wt 177 lb (80.287 kg)  BMI 24.70 kg/m2 Well nourished, well developed, in no acute distress HEENT: normal Neck: no JVD Cardiac:  normal S1, S2; RRR; no murmur Lungs:  clear to auscultation bilaterally, no wheezing, rhonchi or rales Abd: soft, nontender, no hepatomegaly Ext: no edema Skin: warm and dry Neuro:  CNs 2-12 intact, no focal abnormalities noted  EKG:  Sinus brady with PVC  ASSESSMENT AND PLAN:  Nicholas [name] is a 58 y.o. male with a past medical history of HTN, HLD, CAD s/p CABG in 2003 who presents to the clinic for evaluation of pre-syncopal episodes with recent ED visit and review of a 2-week event monitor.    Pre-syncope- still with continued episodes of pre-syncope and dizziness.  Review of his 2 week event monitor reveals sinus brady with frequent PVCs, bigeminy, couplets and several episodes of NSVT. -- Episodes could be due to bradycardia or chronotropic incompetence. Will admit to telemetry with exercise myoview in the AM and possible EP eval.   CAD- s/p CABG in 2003. No chest pain, but increased DOE and fatiugability. Will admit for observation and exercise nuclear stress test in the AM.  -- Continue ASA, statin. No BB due to bradycardia and hx of hypotension   HLD- continue statin  HTN- continue HCTZ and enalapril   Disposition: pending nuclear study   Signed, Gareth Morgan, MHS 06/10/2014 10:13 AM    [facility] [place], [place], [place]  [address] Phone: [phone]; Fax: [phone]

## 2014-06-10 NOTE — H&P (Signed)
Nicholas [name] 06/10/2014

## 2014-06-10 NOTE — H&P (Signed)
Cardiology Office Note    Date:  06/10/2014   ID:  Nicholas [name], DOB 1956/07/05, MRN [id number]  PCP:  Donnie Coffin, MD  Cardiologist:  Dr. Marlou Porch    History of Present Illness: Nicholas [name] is a 58 y.o. male with a past medical history of HTN, HLD, CAD s/p CABG in 2003 who presents to the clinic for evaluation of pre-syncopal episodes with recent ED visit and review of a 2-week event monitor.   He is followed by Dr. Marlou Porch and was last seen in 06/2013 and there was documentation the he complained of some dizziness when he stood up. He had been doing well since that time but did have intermittent dizziness. He then presented to the [facility] ED on 05/21/14 with worsening dizziness and pre-syncope. Telemetry in the ED revealed sinus brady with frequent ectopy with PVCS and bigeminy. Orthostatics and all other lab work, including troponin, returned normal. He was not on any AVN blocking agents. He was seen by cardiology who thought that his symptoms could be related to bradycardia or chronotropic incompetence and recommended discharge with a 2 week event monitor and an outpatient stress test to rule-out ischemia. An exercise myoview was preferred to help to evaluate for chronotropic incompetence.  Today he complains of continued episodes of pre-syncope occuring daily, especially when he leans forward and when he gets emotionally worked up. Since his discharge from the ED he called in the office to see if he could be seen sooner due to continued episodes of pre-sycnope. He had to leave work last Thursday and has had to sit out of work for an hour or so on a couple occasions due to these symptoms. He also notes DOE lately. He cannot mow the lawn anymore due to being "tired out" and dyspnic. His last stress test was in 2013, which returned non-ischemic. He has had no chest pain. His chest pain prior to his CABG was indigestion like and he has not had anything reminiscent of this. Review of his 2 week event  monitor reveals sinus brady with frequent PVCs, bigeminy, couplets and several episodes of NSVT. He has not scheduled his nuclear stress test yet.   He denies LE edema, orthopnea or PND.  Studies:  - Echo (04/2009): EF 55-60% w/ basal to mid inferoseptal wall akinesis/ dyskinesis/ anyerism with otherwise normal contraction. Mild MR, trace AR. G1DD with elevated LA pressure. (EF improved from 40-45% at the time of CABG in 2003)  - Nuclear (10/31/11): Abnormal perfusion study with fixed old inferior basal infarction. EF 46%. No sig ischemia.     Recent Labs/Images: 05/21/2014: ALT 32; Creatinine 1.21; Hemoglobin 13.7; Potassium 4.1  Dg Chest 2 View  05/21/2014   CLINICAL DATA:  Dizziness.  EXAM: CHEST  2 VIEW  COMPARISON:  12/18/2009  FINDINGS: Heart size and pulmonary vascularity are normal and the lungs are clear. No effusions. No osseous abnormality. CABG.  IMPRESSION: No active cardiopulmonary disease.   Electronically Signed   By: Rozetta Nunnery M.D.   On: 05/21/2014 09:55     Wt Readings from Last 3 Encounters:  06/10/14 177 lb (80.287 kg)     Past Medical History  Diagnosis Date  . Myocardial infarction   . Hypertension   . Hypercholesteremia     Current Outpatient Prescriptions  Medication Sig Dispense Refill  . alfuzosin (UROXATRAL) 10 MG 24 hr tablet Take 10 mg by mouth every evening.      Marland Kitchen aspirin EC 81 MG tablet Take 81  mg by mouth daily with breakfast.      . enalapril (VASOTEC) 10 MG tablet Take 5 mg by mouth daily.      . hydrochlorothiazide (HYDRODIURIL) 25 MG tablet Take 12.5 mg by mouth daily.      . simvastatin (ZOCOR) 40 MG tablet Take 40 mg by mouth every evening.       No current facility-administered medications for this visit.     Allergies:   Review of patient's allergies indicates no known allergies.   Social History:  The patient  reports that he quit smoking about 12 years ago. He has never used smokeless tobacco. He reports that he drinks alcohol. He  reports that he uses illicit drugs (Marijuana).   Family History:  The patient's family history includes Cancer in his mother; Diabetes in his mother; Diabetic kidney disease in his mother; Hypertension in his father and mother; Kidney disease in his mother; Other in his father; Throat cancer in his mother. There is no history of Heart attack.   ROS:  Please see the history of present illness.  All other systems reviewed and negative.   PHYSICAL EXAM: VS:  BP 110/68  Pulse 66  Ht 5\' 11"  (1.803 m)  Wt 177 lb (80.287 kg)  BMI 24.70 kg/m2 Well nourished, well developed, in no acute distress HEENT: normal Neck: no JVD Cardiac:  normal S1, S2; RRR; no murmur Lungs:  clear to auscultation bilaterally, no wheezing, rhonchi or rales Abd: soft, nontender, no hepatomegaly Ext: no edema Skin: warm and dry Neuro:  CNs 2-12 intact, no focal abnormalities noted  EKG:  Sinus brady with PVC  ASSESSMENT AND PLAN:  Nicholas [name] is a 58 y.o. male with a past medical history of HTN, HLD, CAD s/p CABG in 2003 who presents to the clinic for evaluation of pre-syncopal episodes with recent ED visit and review of a 2-week event monitor.    Pre-syncope- still with continued episodes of pre-syncope and dizziness.  Review of his 2 week event monitor reveals sinus brady with frequent PVCs, bigeminy, couplets and several episodes of NSVT. -- Episodes could be due to bradycardia or chronotropic incompetence. Will admit to telemetry with exercise myoview in the AM and possible EP eval.   CAD- s/p CABG in 2003. No chest pain, but increased DOE and fatiugability. Will admit for observation and exercise nuclear stress test in the AM.  -- Continue ASA, statin. No BB due to bradycardia and hx of hypotension   HLD- continue statin  HTN- continue HCTZ and enalapril   Disposition: pending nuclear study   Signed, Gareth Morgan, MHS 06/10/2014 10:13 AM    [facility] [place], [place], [place]  [address] Phone: [phone]; Fax: [phone]

## 2014-06-10 NOTE — Patient Instructions (Signed)
PT ADMITTED TO [place] [facility]

## 2014-06-11 ENCOUNTER — Observation Stay (HOSPITAL_COMMUNITY): Payer: 59

## 2014-06-11 DIAGNOSIS — R55 Syncope and collapse: Secondary | ICD-10-CM

## 2014-06-11 DIAGNOSIS — I059 Rheumatic mitral valve disease, unspecified: Secondary | ICD-10-CM

## 2014-06-11 DIAGNOSIS — I2510 Atherosclerotic heart disease of native coronary artery without angina pectoris: Secondary | ICD-10-CM

## 2014-06-11 LAB — CBC
HCT: 36.2 % — ABNORMAL LOW (ref 39.0–52.0)
Hemoglobin: 12.5 g/dL — ABNORMAL LOW (ref 13.0–17.0)
MCH: 28.9 pg (ref 26.0–34.0)
MCHC: 34.5 g/dL (ref 30.0–36.0)
MCV: 83.6 fL (ref 78.0–100.0)
PLATELETS: 152 10*3/uL (ref 150–400)
RBC: 4.33 MIL/uL (ref 4.22–5.81)
RDW: 13.1 % (ref 11.5–15.5)
WBC: 5.7 10*3/uL (ref 4.0–10.5)

## 2014-06-11 LAB — COMPREHENSIVE METABOLIC PANEL
ALT: 26 U/L (ref 0–53)
AST: 28 U/L (ref 0–37)
Albumin: 3.1 g/dL — ABNORMAL LOW (ref 3.5–5.2)
Alkaline Phosphatase: 45 U/L (ref 39–117)
Anion gap: 7 (ref 5–15)
BILIRUBIN TOTAL: 0.3 mg/dL (ref 0.3–1.2)
BUN: 21 mg/dL (ref 6–23)
CHLORIDE: 105 meq/L (ref 96–112)
CO2: 28 meq/L (ref 19–32)
CREATININE: 1.38 mg/dL — AB (ref 0.50–1.35)
Calcium: 8.9 mg/dL (ref 8.4–10.5)
GFR, EST AFRICAN AMERICAN: 64 mL/min — AB (ref >90)
GFR, EST NON AFRICAN AMERICAN: 55 mL/min — AB (ref >90)
GLUCOSE: 96 mg/dL (ref 70–99)
Potassium: 5.2 mEq/L (ref 3.7–5.3)
Sodium: 140 mEq/L (ref 137–147)
Total Protein: 6.4 g/dL (ref 6.0–8.3)

## 2014-06-11 LAB — PROTIME-INR
INR: 1.05 (ref 0.00–1.49)
Prothrombin Time: 13.7 seconds (ref 11.6–15.2)

## 2014-06-11 LAB — LIPID PANEL
Cholesterol: 116 mg/dL (ref 0–200)
HDL: 57 mg/dL (ref >39)
LDL Cholesterol: 47 mg/dL (ref 0–99)
Total CHOL/HDL Ratio: 2 RATIO
Triglycerides: 60 mg/dL (ref <150)
VLDL: 12 mg/dL (ref 0–40)

## 2014-06-11 LAB — TROPONIN I: Troponin I: <0.3 ng/mL (ref <0.30)

## 2014-06-11 MED ORDER — TECHNETIUM TC 99M SESTAMIBI GENERIC - CARDIOLITE
30.0000 | Freq: Once | INTRAVENOUS | Status: AC | PRN
  Administered 2014-06-11: 30 via INTRAVENOUS

## 2014-06-11 MED ORDER — TECHNETIUM TC 99M SESTAMIBI GENERIC - CARDIOLITE
10.0000 | Freq: Once | INTRAVENOUS | Status: AC | PRN
  Administered 2014-06-11: 10 via INTRAVENOUS

## 2014-06-11 NOTE — Progress Notes (Signed)
  Echocardiogram 2D Echocardiogram has been performed.  Mauricio Po 06/11/2014, 5:15 PM

## 2014-06-11 NOTE — Progress Notes (Signed)
    Subjective:  Denies CP or dyspnea   Objective:  Filed Vitals:   06/11/14 1016 06/11/14 1018 06/11/14 1019 06/11/14 1022  BP: 163/92 121/64 142/78   Pulse: 129 160 122 88  Temp:      TempSrc:      Resp:      Height:      Weight:      SpO2:        Intake/Output from previous day: No intake or output data in the 24 hours ending 06/11/14 1208  Physical Exam: Physical exam: Well-developed well-nourished in no acute distress.  Skin is warm and dry.  HEENT is normal.  Neck is supple.  Chest is clear to auscultation with normal expansion.  Cardiovascular exam is regular rate and rhythm.  Abdominal exam nontender or distended. No masses palpated. Extremities show no edema. neuro grossly intact    Lab Results: Basic Metabolic Panel:  Recent Labs  06/10/14 1300 06/11/14 0100  NA 139 140  K 4.5 5.2  CL 103 105  CO2 23 28  GLUCOSE 103* 96  BUN 17 21  CREATININE 1.09 1.38*  CALCIUM 9.2 8.9  MG 2.2  --    CBC:  Recent Labs  06/10/14 1300 06/11/14 0100  WBC 6.2 5.7  HGB 14.0 12.5*  HCT 39.7 36.2*  MCV 81.9 83.6  PLT 149* 152   Cardiac Enzymes:  Recent Labs  06/10/14 1228 06/10/14 1925 06/11/14 0100  TROPONINI <0.30 <0.30 <0.30     Assessment/Plan:  1 dizziness-patient symptoms sound to be orthostatic mediated. He only has symptoms when he stands suddenly or goes from the bending position to the standing position. Apparently his blood pressure has been running low at home. Discontinue enalapril and HCTZ. Check orthostatics. Check echocardiogram. Increase fluid intake. 2 dyspnea on exertion-await results of nuclear study. 3 coronary artery disease-continue aspirin and statin. 4 ectopy-patient noted to have frequent PVCs on telemetry. He was occasionally bradycardic. We can consider adding low-dose metoprolol if his heart rate will tolerate based on followup monitor results. If patient feels better we can discharge tomorrow and followup with Dr.  Marlou Porch.  Nicholas [name] 06/11/2014, 12:08 PM

## 2014-06-11 NOTE — Progress Notes (Signed)
Patient Name: Nicholas [name] Date of Encounter: 06/11/2014     Active Problems:   Pre-syncope    SUBJECTIVE  Denies any CP or SOB. States he has been having frequent presyncopal episodes of unclear etiology  CURRENT MEDS . alfuzosin  10 mg Oral QPM  . aspirin  324 mg Oral NOW   Or  . aspirin  300 mg Rectal NOW  . aspirin EC  81 mg Oral Daily  . enalapril  5 mg Oral Daily  . heparin  5,000 Units Subcutaneous 3 times per day  . hydrochlorothiazide  12.5 mg Oral Daily  . simvastatin  40 mg Oral QPM  . sodium chloride  3 mL Intravenous Q12H    OBJECTIVE  Filed Vitals:   06/10/14 1228 06/10/14 2002 06/11/14 0516  BP: 124/67 119/62 104/55  Pulse: 35 58 68  Temp: 98.2 F (36.8 C) 98.9 F (37.2 C) 98.4 F (36.9 C)  TempSrc: Oral Oral Oral  Resp: 16 17 18   Height: 5\' 11"  (1.803 m)    Weight: 177 lb (80.287 kg)  167 lb 8.8 oz (76 kg)  SpO2: 100% 99% 100%   No intake or output data in the 24 hours ending 06/11/14 1001 Filed Weights   06/10/14 1228 06/11/14 0516  Weight: 177 lb (80.287 kg) 167 lb 8.8 oz (76 kg)    PHYSICAL EXAM  General: Pleasant, NAD. Neuro: Alert and oriented X 3. Moves all extremities spontaneously. Psych: Normal affect. HEENT:  Normal  Neck: Supple without bruits or JVD. Lungs:  Resp regular and unlabored, CTA. Heart: RRR no s3, s4, or murmurs. Abdomen: Soft, non-tender, non-distended, BS + x 4.  Extremities: No clubbing, cyanosis or edema. DP/PT/Radials 2+ and equal bilaterally.   Accessory Clinical Findings  CBC  Recent Labs  06/10/14 1300 06/11/14 0100  WBC 6.2 5.7  HGB 14.0 12.5*  HCT 39.7 36.2*  MCV 81.9 83.6  PLT 149* 329   Basic Metabolic Panel  Recent Labs  06/10/14 1300 06/11/14 0100  NA 139 140  K 4.5 5.2  CL 103 105  CO2 23 28  GLUCOSE 103* 96  BUN 17 21  CREATININE 1.09 1.38*  CALCIUM 9.2 8.9  MG 2.2  --    Liver Function Tests  Recent Labs  06/11/14 0100  AST 28  ALT 26  ALKPHOS 45  BILITOT 0.3    PROT 6.4  ALBUMIN 3.1*   Cardiac Enzymes  Recent Labs  06/10/14 1228 06/10/14 1925 06/11/14 0100  TROPONINI <0.30 <0.30 <0.30   Hemoglobin A1C  Recent Labs  06/10/14 1300  HGBA1C 6.4*   Fasting Lipid Panel  Recent Labs  06/11/14 0100  CHOL 116  HDL 57  LDLCALC 47  TRIG 60  CHOLHDL 2.0   Thyroid Function Tests  Recent Labs  06/10/14 1300  TSH 1.320    TELE NSR with HR 60s, frequent PVC and bigeminy. No significant ventricular ectopy    ECG  NSR with PVCs, TWI in inferolateral leads  Echo Echo (04/2009): EF 55-60% w/ basal to mid inferoseptal wall akinesis/ dyskinesis/ anyerism with otherwise normal contraction. Mild MR, trace AR. G1DD with elevated LA pressure. (EF improved from 40-45% at the time of CABG in 2003)   Radiology/Studies  Dg Chest 2 View  05/21/2014   CLINICAL DATA:  Dizziness.  EXAM: CHEST  2 VIEW  COMPARISON:  12/18/2009  FINDINGS: Heart size and pulmonary vascularity are normal and the lungs are clear. No effusions. No osseous abnormality. CABG.  IMPRESSION: No active cardiopulmonary disease.   Electronically Signed   By: Rozetta Nunnery M.D.   On: 05/21/2014 09:55    ASSESSMENT AND PLAN  1. Presyncope/dizziness  - pending treadmill nuc result, was able to exercise for 10:30am into 4th stage of treadmill stress. HR slow to come up.   2. CAD- s/p CABG in 2003 3. HLD- continue statin  4. HTN- continue HCTZ and enalapril   Ruben Im Pager: [phone] See previous progress notes Kirk Ruths

## 2014-06-11 NOTE — Progress Notes (Signed)
Treadmill stress test completed without complication. HR slow to come up, had to go into 4th stage of test. Total exercise time 10:86min. No CP or presyncope. Pending result by [facility].  Hilbert Corrigan PA Pager: [phone]

## 2014-06-11 NOTE — Progress Notes (Signed)
UR Completed.  Henchy Mccauley Jane [phone] 06/11/2014  

## 2014-06-12 DIAGNOSIS — R55 Syncope and collapse: Secondary | ICD-10-CM | POA: Diagnosis not present

## 2014-06-12 MED ORDER — NITROGLYCERIN 0.4 MG SL SUBL: 0.4000 mg | SUBLINGUAL_TABLET | SUBLINGUAL | Status: DC | PRN

## 2014-06-12 NOTE — Progress Notes (Signed)
Nursing note Paper prescription given to patient will discharge home as ordered. Elyas Villamor, [name] RN

## 2014-06-12 NOTE — Discharge Summary (Signed)
Discharge Summary   Patient ID: Nicholas [name] MRN: [id number], DOB/AGE: 10-24-1955 58 y.o. Admit date: 06/10/2014 D/C date:     06/12/2014  Primary Cardiologist: Dr. Marlou Porch  Principal Problem:   Pre-syncope Active Problems:   CAD- CABG x 3 in [place] '03   Dizziness when bending over   Frequent PVCs   Sinus bradycardia   Chronic renal insufficiency, stage II (mild)   Dyslipidemia   Admission Dates: 06/10/14- 06/12/14 Discharge Diagnosis: pre-syncope s/p low risk nuclear stress test and ECHO w/ normal LV function  HPI: Nicholas [name] is a 58 y.o. male with a history of HTN, HLD, CAD s/p CABG in 2003 who was sent from the clinic to [facility] for admission on 06/10/14 for evaluation of recurrent pre-syncope.   He is followed by Dr. Marlou Porch and was last seen in 06/2013 and there was documentation the he complained of some dizziness when he stood up. He had been doing well since that time but did have intermittent dizziness. He then presented to the [facility] ED on 05/21/14 with worsening dizziness and pre-syncope. Telemetry in the ED revealed sinus brady with frequent ectopy with PVCS and bigeminy. Orthostatics and all other lab work, including troponin, returned normal. He was not on any AVN blocking agents. He was seen by cardiology who thought that his symptoms could be related to bradycardia or chronotropic incompetence and recommended discharge with a 2 week event monitor and an outpatient stress test to rule-out ischemia. An exercise myoview was preferred to help to evaluate for chronotropic incompetence.  In the office he complained of continued episodes of pre-syncope occuring daily, especially when he leans forward and when he gets emotionally worked up. Since his discharge from the ED he called in the office to see if he could be seen sooner due to continued episodes of pre-sycnope. He had to leave work last Thursday and has had to sit out of work for an hour or so on a couple occasions due to these  symptoms. He also notes DOE lately. He cannot mow the lawn anymore due to being "tired out" and dyspnic. His last stress test was in 2013, which returned non-ischemic. He has had no chest pain. His chest pain prior to his CABG was indigestion like and he has not had anything reminiscent of this. Review of his 2 week event monitor reveals sinus brady with frequent PVCs, bigeminy, couplets and several episodes of NSVT. He had not scheduled his nuclear stress test yet.    Hospital Course: With his ongoing symptoms it was elected to admit him from the office for further diagnostic studies.  Dizziness-patient symptoms sound to be orthostatic mediated. He only has symptoms when he stands suddenly or goes from the bending position to the standing position. Apparently his blood pressure has been running low at home. Enalapril and HCTZ DCed. Symptoms improved this AM. Encouraged to increase fluid intake.   CAD- s/p CABG in 2003. -- No chest pain, but increased DOE and fatiugability. -- Nuclear study shows infarct but no ischemia; echo shows preserved LV function; medical therapy.  -- Continue ASA, statin. No BB due to bradycardia and hx of hypotension   Ectopy- patient noted to have frequent PVCs on telemetry. He was occasionally bradycardic. We can consider adding low-dose metoprolol as outpatient if his heart rate will tolerate based on followup monitor results.   HTN-Patient instructed to follow blood pressure at home. Lower dose medications can be added as an outpatient if his blood  pressure increases.  The patient has had an uncomplicated hospital course and is recovering well. He has been seen by Dr. Stanford Breed today and deemed ready for discharge home. All follow-up appointments have been scheduled. Discharge medications are listed below.  Enalapril and HCTZ DCed.    Discharge Vitals: Blood pressure 120/89, pulse 71, temperature 98.6 F (37 C), temperature source Oral, resp. rate 18, height 5\' 11"   (1.803 m), weight 167 lb 8.8 oz (76 kg), SpO2 100.00%.  Labs: Lab Results  Component Value Date   WBC 5.7 06/11/2014   HGB 12.5* 06/11/2014   HCT 36.2* 06/11/2014   MCV 83.6 06/11/2014   PLT 152 06/11/2014     Recent Labs Lab 06/11/14 0100  NA 140  K 5.2  CL 105  CO2 28  BUN 21  CREATININE 1.38*  CALCIUM 8.9  PROT 6.4  BILITOT 0.3  ALKPHOS 45  ALT 26  AST 28  GLUCOSE 96    Recent Labs  06/10/14 1228 06/10/14 1925 06/11/14 0100  TROPONINI <0.30 <0.30 <0.30   Lab Results  Component Value Date   CHOL 116 06/11/2014   HDL 57 06/11/2014   LDLCALC 47 06/11/2014   TRIG 60 06/11/2014     Diagnostic Studies/Procedures   Dg Chest 2 View  05/21/2014   CLINICAL DATA:  Dizziness.  EXAM: CHEST  2 VIEW  COMPARISON:  12/18/2009  FINDINGS: Heart size and pulmonary vascularity are normal and the lungs are clear. No effusions. No osseous abnormality. CABG.  IMPRESSION: No active cardiopulmonary disease.   Electronically Signed   By: Rozetta Nunnery M.D.   On: 05/21/2014 09:55    Nm Myocar Multi W/spect W/wall Motion / Ef  06/11/2014   CLINICAL DATA:  Syncope. Prior CABG. Hyperlipidemia. coronary artery disease. Sinus bradycardia.  EXAM: MYOCARDIAL IMAGING WITH SPECT (REST AND EXERCISE)  GATED LEFT VENTRICULAR WALL MOTION STUDY  LEFT VENTRICULAR EJECTION FRACTION  TECHNIQUE: Standard myocardial SPECT imaging was performed after resting intravenous injection of 10 mCi Tc-72m sestamibi. Subsequently, exercise tolerance test was performed by the patient under the supervision of the Cardiology staff. At peak-stress, 30 mCi Tc-81m sestamibi was injected intravenously and standard myocardial SPECT imaging was performed. Quantitative gated imaging was also performed to evaluate left ventricular wall motion, and estimate left ventricular ejection fraction.  COMPARISON:  None.  FINDINGS: Perfusion: Large moderately severe perfusion defect observed at in the inferior wall extending from the mid heart to the base.  Some but not all of this might be attributable to diaphragmatic attenuation. Apical thinning.  Wall Motion: Mild lateral and inferobasilar hypokinesis and poor wall thickening.  Left Ventricular Ejection Fraction: 10% %  End diastolic volume 258  ml  End systolic volume 84 ml  IMPRESSION: 1. Inferior wall scar, large and moderately severe. No inducible ischemia.  2. Mild lateral and inferobasilar hypokinesis and poor wall thickening.  3. Left ventricular ejection fraction 46%%  4. High-risk stress test findings*. (Based on and mild to moderately dilated left ventricle along with fixed inferior wall defect)  *2012 Appropriate Use Criteria for Coronary Revascularization Focused Update: J Am Coll Cardiol. 5277;82(4):235-361. http://content.airportbarriers.com.aspx?articleid=1201161   Electronically Signed   By: Sherryl Barters M.D.   On: 06/11/2014 12:48    2D ECHO: 06/11/2014 LV EF: 50% - 55% Study Conclusions - Left ventricle: The cavity size was normal. Wall thickness was normal. Systolic function was normal. The estimated ejection fraction was in the range of 50% to 55%. There is akinesis of the basalinferior myocardium. -  Mitral valve: There was mild regurgitation. - Left atrium: The atrium was mildly dilated. Impressions: - Frequent PVC&'s noted.     Discharge Medications     Medication List    STOP taking these medications       enalapril 10 MG tablet  Commonly known as:  VASOTEC     hydrochlorothiazide 25 MG tablet  Commonly known as:  HYDRODIURIL      TAKE these medications       alfuzosin 10 MG 24 hr tablet  Commonly known as:  UROXATRAL  Take 10 mg by mouth every evening.     aspirin EC 81 MG tablet  Take 81 mg by mouth daily with breakfast.     nitroGLYCERIN 0.4 MG SL tablet  Commonly known as:  NITROSTAT  Place 1 tablet (0.4 mg total) under the tongue every 5 (five) minutes x 3 doses as needed for chest pain.     simvastatin 40 MG tablet  Commonly known as:   ZOCOR  Take 40 mg by mouth every evening.        Disposition   The patient will be discharged in stable condition to home.  Follow-up Information   Follow up with [facility] On 07/06/2014. Lurena Joiner Kilroy 10:00am at [facility] office)    Specialty:  Cardiology   Contact information:   [address], [place] [address] [place] [address] [phone]        Duration of Discharge Encounter: Greater than 30 minutes including physician and PA time.  Mable Fill R PA-C 06/12/2014, 3:24 PM

## 2014-06-12 NOTE — Progress Notes (Signed)
PA on call paged through answering service Awaiting call back. Will monitor patient. Nicholas [name], [name] RN

## 2014-06-12 NOTE — Progress Notes (Signed)
    Subjective:  Denies CP or dyspnea   Objective:  Filed Vitals:   06/11/14 1019 06/11/14 1022 06/11/14 2131 06/12/14 0510  BP: 142/78  113/68 120/89  Pulse: 122 88 73 71  Temp:   98.3 F (36.8 C) 98.6 F (37 C)  TempSrc:   Oral Oral  Resp:   18 18  Height:      Weight:      SpO2:   99% 100%    Intake/Output from previous day:  Intake/Output Summary (Last 24 hours) at 06/12/14 0732 Last data filed at 06/11/14 2133  Gross per 24 hour  Intake    600 ml  Output      0 ml  Net    600 ml    Physical Exam: Physical exam: Well-developed well-nourished in no acute distress.  Skin is warm and dry.  HEENT is normal.  Neck is supple.  Chest is clear to auscultation with normal expansion.  Cardiovascular exam is regular rate and rhythm.  Abdominal exam nontender or distended. No masses palpated. Extremities show no edema. neuro grossly intact    Lab Results: Basic Metabolic Panel:  Recent Labs  06/10/14 1300 06/11/14 0100  NA 139 140  K 4.5 5.2  CL 103 105  CO2 23 28  GLUCOSE 103* 96  BUN 17 21  CREATININE 1.09 1.38*  CALCIUM 9.2 8.9  MG 2.2  --    CBC:  Recent Labs  06/10/14 1300 06/11/14 0100  WBC 6.2 5.7  HGB 14.0 12.5*  HCT 39.7 36.2*  MCV 81.9 83.6  PLT 149* 152   Cardiac Enzymes:  Recent Labs  06/10/14 1228 06/10/14 1925 06/11/14 0100  TROPONINI <0.30 <0.30 <0.30     Assessment/Plan:  1 dizziness-patient symptoms sound to be orthostatic mediated. He only has symptoms when he stands suddenly or goes from the bending position to the standing position. Apparently his blood pressure has been running low at home. Enalapril and HCTZ DCed. Symptoms improved this AM. Encouraged to increase fluid intake. 2 dyspnea on exertion-Nuclear study shows infarct but no ischemia; echo shows preserved LV function; medical therapy. 3 coronary artery disease-continue aspirin and statin. 4 ectopy-patient noted to have frequent PVCs on telemetry. He was  occasionally bradycardic. We can consider adding low-dose metoprolol as outpatient if his heart rate will tolerate based on followup monitor results. 5 HTN-Patient instructed to follow blood pressure at home. Lower dose medications can be added as an outpatient if his blood pressure increases. Discharge home today. Followup Dr. Marlou Porch 2 weeks. > 30 min PA and physician time  D2  Kirk Ruths 06/12/2014, 7:32 AM

## 2014-06-12 NOTE — Progress Notes (Signed)
Nursing note Patient give discharge instructions, medication list and AVS, all questions were answered. Pt prescriptions were sent to personal pharmacy. Personal pharmacy closed today, PA oncall paged awaiting call back to obtain new prescription. Will monitor patient. Nicholas [name], [name] RN

## 2014-06-13 NOTE — Discharge Summary (Signed)
See progress notes Doyle Tegethoff  

## 2014-06-18 ENCOUNTER — Telehealth: Payer: Self-pay | Admitting: Cardiology

## 2014-06-18 NOTE — Telephone Encounter (Signed)
Wife aware her FMLA ready For Pick up 9.11.15/km

## 2014-06-18 NOTE — Telephone Encounter (Signed)
FMLA forms completed and taken to [facility] in [place] to contact pt and scan into system.

## 2014-06-21 ENCOUNTER — Telehealth: Payer: Self-pay | Admitting: Cardiology

## 2014-06-21 NOTE — Telephone Encounter (Signed)
Pt's weight is 179. Yesterday it was 175. Please call back.

## 2014-06-21 NOTE — Telephone Encounter (Signed)
Spoke with pt's wife she states pt's weight was 175 lbs yesterday today is 179 lbs. Wife said that she can see some swelling in pt's hands and both ankles. Pt Hydrodiuril 12.5 mg and Vasotec were held since 06/10/14. Pt is aware that this message will be send to MD for recommendations.

## 2014-06-21 NOTE — Telephone Encounter (Signed)
°  Pt's wife called in concerned. She stated that all heart medication was stopped. And now he is retaining fluid. Please call and advise.

## 2014-06-21 NOTE — Telephone Encounter (Signed)
Dr. Meda Coffee DOD aware of pt's weight gain, MD recommends for pt to restart taking the HCTZ 25 mg once daily. Pt's wife aware she states that pt has been taking only 12.5 mg for over a year. Wife  thinks that pt needs to go back to 12.5 mg instead, because his BP is on the low side  112/70 this AM. Pt will start taking the HCTZ 12.5 mg once daily. Pt will call back if needed.

## 2014-06-21 NOTE — Telephone Encounter (Signed)
Pt's wife calling regarding pt gained weight. Wife states pt takes Hydrodiuril 12.5 mg daily ans Vasotec 5 mg daily was held after D/C from the hospital de to low BP and heart rate; for 2 weeks until he is seen in this office. Pt has an appointment with Scott weaver on 9/30 /15.   Pt has not taken these medications for one week. Pt's BP is 112/70 HR 58 beats/minute. Wife states that pt notice he is retaining fluids when he tried to get his  wedding band of his finger this AM; no other symptoms. Wife will tried to get pt's weight and call the office back.

## 2014-06-23 ENCOUNTER — Other Ambulatory Visit: Payer: Self-pay | Admitting: Cardiology

## 2014-07-06 ENCOUNTER — Encounter: Payer: 59 | Admitting: Cardiology

## 2014-07-07 ENCOUNTER — Encounter: Payer: 59 | Admitting: Physician Assistant

## 2014-07-20 ENCOUNTER — Encounter: Payer: Self-pay | Admitting: Cardiology

## 2014-07-27 ENCOUNTER — Encounter: Payer: Self-pay | Admitting: Internal Medicine

## 2014-08-10 ENCOUNTER — Encounter: Payer: Self-pay | Admitting: Cardiology

## 2014-08-10 ENCOUNTER — Ambulatory Visit (INDEPENDENT_AMBULATORY_CARE_PROVIDER_SITE_OTHER): Payer: 59 | Admitting: Cardiology

## 2014-08-10 VITALS — BP 100/82 | HR 84 | Ht 71.0 in | Wt 176.4 lb

## 2014-08-10 DIAGNOSIS — R55 Syncope and collapse: Secondary | ICD-10-CM

## 2014-08-10 DIAGNOSIS — I2510 Atherosclerotic heart disease of native coronary artery without angina pectoris: Secondary | ICD-10-CM

## 2014-08-10 DIAGNOSIS — I2583 Coronary atherosclerosis due to lipid rich plaque: Principal | ICD-10-CM

## 2014-08-10 DIAGNOSIS — I493 Ventricular premature depolarization: Secondary | ICD-10-CM

## 2014-08-10 NOTE — Patient Instructions (Addendum)
Please take Hydrochlorothiazide as needed for swelling. continue all other medications as listed.  Follow up in 4 months with Dr Marlou Porch.

## 2014-08-10 NOTE — Progress Notes (Signed)
[place]. [address], [address], [place]  [address] Phone: [phone] Fax:  [phone]  Date:  08/10/2014   ID:  Nicholas [name], DOB 17-Mar-1956, MRN [id number]  PCP:  Donnie Coffin, MD   History of Present Illness: Nicholas [name] is a 58 y.o. male with coronary artery disease status post bypass surgery in 2003, hypertension, hyperlipidemia, presyncope with recent hospitalization on 06/12/14, low risk exercise treadmill test. His presyncope occurred daily especially when he leaned forward or get emotionally worked up. Cannot move the lawn because of being tired out.he was noted to have frequent ectopy/PVCs on telemetry and occasional bradycardia. Consideration of low-dose metoprolol as outpatient if heart rate can tolerate.  Nuclear stress test on 06/11/14 demonstrated inferior scar, large with no inducible ischemia with ejection fraction of 46%.  Echocardiogram on 06/11/14 showed EF of 50-55%.there was akinesis of the basal inferior myocardium.  Recent edema was noted, hydrochlorothiazide was recently started again at 12.5 mg. His blood pressures however remained quite low in the 90s at times, low 100s. I decided to use HCTZ only on as-needed basis because of this. He is no longer on enalapril, metoprolol.   Wt Readings from Last 3 Encounters:  08/10/14 176 lb 6.4 oz (80.015 kg)  06/11/14 167 lb 8.8 oz (76 kg)  06/10/14 177 lb (80.287 kg)     Past Medical History  Diagnosis Date  . Myocardial infarction   . Hypertension   . Hypercholesteremia   . Coronary artery disease   . Dysrhythmia 06/2014    sinus brady with PVC'S    Past Surgical History  Procedure Laterality Date  . Cardiac surgery      triple bypass  . Coronary artery bypass graft      CABG x 3 Vessels    Current Outpatient Prescriptions  Medication Sig Dispense Refill  . alfuzosin (UROXATRAL) 10 MG 24 hr tablet Take 10 mg by mouth every evening.    Marland Kitchen aspirin EC 81 MG tablet Take 81 mg by mouth daily with  breakfast.    . etodolac (LODINE) 400 MG tablet Take 400 mg by mouth as needed.  0  . hydrochlorothiazide (HYDRODIURIL) 25 MG tablet Take 25 mg by mouth as needed. For swelling  0  . nitroGLYCERIN (NITROSTAT) 0.4 MG SL tablet Place 1 tablet (0.4 mg total) under the tongue every 5 (five) minutes x 3 doses as needed for chest pain. 25 tablet 12  . simvastatin (ZOCOR) 40 MG tablet Take 40 mg by mouth every evening.     No current facility-administered medications for this visit.    Allergies:   No Known Allergies  Social History:  The patient  reports that he quit smoking about 12 years ago. He has never used smokeless tobacco. He reports that he drinks alcohol. He reports that he uses illicit drugs (Marijuana).   Family History  Problem Relation Age of Onset  . Diabetic kidney disease Mother   . Kidney disease Mother   . Throat cancer Mother   . Other Father     unknown  . Cancer Mother   . Diabetes Mother   . Hypertension Mother   . Hypertension Father   . Heart attack Neg Hx     ROS:  Please see the history of present illness.   No further syncope/presyncope, no dizziness, no orthopnea. Edema has improved. No CP.   All other systems reviewed and negative.   PHYSICAL EXAM: VS:  BP 100/82 mmHg  Pulse 84  Ht 5\' 11"  (1.803 m)  Wt 176 lb 6.4 oz (80.015 kg)  BMI 24.61 kg/m2 Well nourished, well developed, in no acute distress HEENT: normal, [place]/AT, EOMI Neck: no JVD, normal carotid upstroke, no bruit Cardiac:  normal S1, S2; RRR; no murmur Lungs:  clear to auscultation bilaterally, no wheezing, rhonchi or rales Abd: soft, nontender, no hepatomegaly, no bruits Ext: no edema, 2+ distal pulses Skin: warm and dry GU: deferred Neuro: no focal abnormalities noted, AAO x 3  EKG:  None today     ASSESSMENT AND PLAN:  1. Coronary artery disease status post bypass-doing well. Stable, no angina. Inferior scar noted on nuclear stress test. 2. Presyncope-could be precipitated by  relative hypotension. Both his enalapril and HCTZ were held during hospitalization. His HCTZ was restarted at 12.5 mg after noteworthy hand edema was noted. I decided to use the HCTZ on a when necessary basis for edema since his blood pressure remains fairly low. 3. Bradycardia-could've been relative bradycardia at times secondary to PVCs. I will avoid metoprolol. He has tried this in the past and has not tolerated it. 4. PVCs-no PVCs noted today.  PVCs noted on telemetry in the hospital. 5. 4 month follow up.  Signed, Candee Furbish, MD [facility]  08/10/2014 4:39 PM

## 2014-08-26 ENCOUNTER — Ambulatory Visit (AMBULATORY_SURGERY_CENTER): Payer: Self-pay | Admitting: *Deleted

## 2014-08-26 VITALS — Ht 71.0 in | Wt 175.4 lb

## 2014-08-26 DIAGNOSIS — Z1211 Encounter for screening for malignant neoplasm of colon: Secondary | ICD-10-CM

## 2014-08-26 NOTE — Progress Notes (Signed)
Denies allergies to eggs or soy products. Denies complications with sedation or anesthesia. Denies O2 use. Denies use of diet or weight loss medications.  Emmi instructions given for colonoscopy.  

## 2014-09-15 ENCOUNTER — Encounter: Payer: Self-pay | Admitting: Internal Medicine

## 2014-09-15 ENCOUNTER — Ambulatory Visit (AMBULATORY_SURGERY_CENTER): Payer: 59 | Admitting: Internal Medicine

## 2014-09-15 VITALS — BP 130/73 | HR 47 | Temp 97.7°F | Resp 18 | Ht 71.0 in | Wt 175.0 lb

## 2014-09-15 DIAGNOSIS — D124 Benign neoplasm of descending colon: Secondary | ICD-10-CM

## 2014-09-15 DIAGNOSIS — Z1211 Encounter for screening for malignant neoplasm of colon: Secondary | ICD-10-CM

## 2014-09-15 MED ORDER — SODIUM CHLORIDE 0.9 % IV SOLN: 500.0000 mL | INTRAVENOUS | Status: DC

## 2014-09-15 NOTE — Patient Instructions (Addendum)
I found and removed 3 small polyps that look benign.  I will let you know pathology results and when to have another routine colonoscopy by mail.  I appreciate the opportunity to care for you. Gatha Mayer, MD, [facility]  Discharge instructions given. Handout on polyps. Resume previous medications. YOU HAD AN ENDOSCOPIC PROCEDURE TODAY AT [facility]: Refer to the procedure report that was given to you for any specific questions about what was found during the examination.  If the procedure report does not answer your questions, please call your gastroenterologist to clarify.  If you requested that your care partner not be given the details of your procedure findings, then the procedure report has been included in a sealed envelope for you to review at your convenience later.  YOU SHOULD EXPECT: Some feelings of bloating in the abdomen. Passage of more gas than usual.  Walking can help get rid of the air that was put into your GI tract during the procedure and reduce the bloating. If you had a lower endoscopy (such as a colonoscopy or flexible sigmoidoscopy) you may notice spotting of blood in your stool or on the toilet paper. If you underwent a bowel prep for your procedure, then you may not have a normal bowel movement for a few days.  DIET: Your first meal following the procedure should be a light meal and then it is ok to progress to your normal diet.  A half-sandwich or bowl of soup is an example of a good first meal.  Heavy or fried foods are harder to digest and may make you feel nauseous or bloated.  Likewise meals heavy in dairy and vegetables can cause extra gas to form and this can also increase the bloating.  Drink plenty of fluids but you should avoid alcoholic beverages for 24 hours.  ACTIVITY: Your care partner should take you home directly after the procedure.  You should plan to take it easy, moving slowly for the rest of the day.  You can resume normal  activity the day after the procedure however you should NOT DRIVE or use heavy machinery for 24 hours (because of the sedation medicines used during the test).    SYMPTOMS TO REPORT IMMEDIATELY: A gastroenterologist can be reached at any hour.  During normal business hours, 8:30 AM to 5:00 PM Monday through Friday, call [phone].  After hours and on weekends, please call the GI answering service at [phone] who will take a message and have the physician on call contact you.   Following lower endoscopy (colonoscopy or flexible sigmoidoscopy):  Excessive amounts of blood in the stool  Significant tenderness or worsening of abdominal pains  Swelling of the abdomen that is new, acute  Fever of 100F or higher  FOLLOW UP: If any biopsies were taken you will be contacted by phone or by letter within the next 1-3 weeks.  Call your gastroenterologist if you have not heard about the biopsies in 3 weeks.  Our staff will call the home number listed on your records the next business day following your procedure to check on you and address any questions or concerns that you may have at that time regarding the information given to you following your procedure. This is a courtesy call and so if there is no answer at the home number and we have not heard from you through the emergency physician on call, we will assume that you have returned to your regular daily activities  without incident.  SIGNATURES/CONFIDENTIALITY: You and/or your care partner have signed paperwork which will be entered into your electronic medical record.  These signatures attest to the fact that that the information above on your After Visit Summary has been reviewed and is understood.  Full responsibility of the confidentiality of this discharge information lies with you and/or your care-partner.

## 2014-09-15 NOTE — Op Note (Signed)
[place]  [address]. [place], [address]   COLONOSCOPY PROCEDURE REPORT  PATIENT: Nicholas [name], Nicholas [name]  MR#: [id number] BIRTHDATE: 1956/07/24 , 58  yrs. old GENDER: male ENDOSCOPIST: Gatha Mayer, MD, [facility] PROCEDURE DATE:  09/15/2014 PROCEDURE:   Colonoscopy with snare polypectomy First Screening Colonoscopy - Avg.  risk and is 50 yrs.  old or older Yes.  Prior Negative Screening - Now for repeat screening. N/A  History of Adenoma - Now for follow-up colonoscopy & has been > or = to 3 yrs.  N/A  Polyps Removed Today? Yes. ASA CLASS:   Class III INDICATIONS:first colonoscopy and average risk for colorectal cancer. MEDICATIONS: Propofol 500 mg IV and Monitored anesthesia care  DESCRIPTION OF PROCEDURE:   After the risks benefits and alternatives of the procedure were thoroughly explained, informed consent was obtained.  The digital rectal exam revealed no abnormalities of the rectum, revealed no prostatic nodules, and revealed the prostate was not enlarged.   The LB [id number] endoscope was introduced through the anus and advanced to the cecum, which was identified by both the appendix and ileocecal valve. No adverse events experienced.   The quality of the prep was excellent, using MiraLax  The instrument was then slowly withdrawn as the colon was fully examined.   COLON FINDINGS: Three sessile polyps ranging from 3 to 54mm in size were found in the descending colon.  Polypectomies were performed with a cold snare.  The resection was complete, the polyp tissue was completely retrieved and sent to histology.   The examination was otherwise normal.  Retroflexed views revealed no abnormalities. The time to cecum=4 minutes 13 seconds.  Withdrawal time=14 minutes 17 seconds.  The scope was withdrawn and the procedure completed. COMPLICATIONS: There were no immediate complications.  ENDOSCOPIC IMPRESSION: 1.   Three sessile polyps ranging from 3 to 38mm in  size were found in the descending colon; polypectomies were performed with a cold snare 2.   The examination was otherwise normal  RECOMMENDATIONS: Timing of repeat colonoscopy will be determined by pathology findings.  eSigned:  Gatha Mayer, MD, [facility] 09/15/2014 11:41 AM   cc: The Patient and Donnie Coffin, MD

## 2014-09-15 NOTE — Progress Notes (Signed)
A/ox3, pleased with MAC, report to RN 

## 2014-09-15 NOTE — Progress Notes (Signed)
Called to room to assist during endoscopic procedure.  Patient ID and intended procedure confirmed with present staff. Received instructions for my participation in the procedure from the performing physician.  

## 2014-09-16 ENCOUNTER — Telehealth: Payer: Self-pay | Admitting: *Deleted

## 2014-09-16 NOTE — Telephone Encounter (Signed)
  Follow up Call-  Call back number 09/15/2014  Post procedure Call Back phone  # [phone]  Permission to leave phone message Yes     Patient questions:  Do you have a fever, pain , or abdominal swelling? No. Pain Score  0 *  Have you tolerated food without any problems? Yes.    Have you been able to return to your normal activities? Yes.    Do you have any questions about your discharge instructions: Diet   No. Medications  No. Follow up visit  No.  Do you have questions or concerns about your Care? No.  Actions: * If pain score is 4 or above: No action needed, pain <4.

## 2014-09-21 ENCOUNTER — Encounter: Payer: Self-pay | Admitting: Internal Medicine

## 2014-09-21 DIAGNOSIS — Z86010 Personal history of colonic polyps: Secondary | ICD-10-CM

## 2014-09-21 DIAGNOSIS — Z860101 Personal history of adenomatous and serrated colon polyps: Secondary | ICD-10-CM

## 2014-09-21 HISTORY — DX: Personal history of colonic polyps: Z86.010

## 2014-09-21 HISTORY — DX: Personal history of adenomatous and serrated colon polyps: Z86.0101

## 2014-09-21 NOTE — Progress Notes (Signed)
Quick Note:  3 small adenomas - repeat colon 2018/9 ______

## 2014-10-07 ENCOUNTER — Other Ambulatory Visit: Payer: Self-pay | Admitting: Cardiology

## 2014-10-11 ENCOUNTER — Other Ambulatory Visit: Payer: Self-pay | Admitting: Cardiology

## 2014-12-09 ENCOUNTER — Encounter: Payer: Self-pay | Admitting: Cardiology

## 2014-12-09 ENCOUNTER — Ambulatory Visit (INDEPENDENT_AMBULATORY_CARE_PROVIDER_SITE_OTHER): Payer: 59 | Admitting: Cardiology

## 2014-12-09 VITALS — BP 112/80 | HR 60 | Ht 71.0 in | Wt 174.0 lb

## 2014-12-09 DIAGNOSIS — I2583 Coronary atherosclerosis due to lipid rich plaque: Principal | ICD-10-CM

## 2014-12-09 DIAGNOSIS — I2510 Atherosclerotic heart disease of native coronary artery without angina pectoris: Secondary | ICD-10-CM

## 2014-12-09 NOTE — Progress Notes (Signed)
[place]. [address], [address] [place], [place]  [address] Phone: [phone] Fax:  [phone]  Date:  12/09/2014   ID:  Nicholas [name], DOB 08-08-1956, MRN [id number]  PCP:  Donnie Coffin, MD   History of Present Illness: Nicholas [name] is a 59 y.o. male with coronary artery disease status post bypass surgery in 2003, hypertension, hyperlipidemia, presyncope with recent hospitalization on 06/12/14, low risk exercise treadmill test.  Last hospitalization was due to presyncope, his blood pressure was quite low. His presyncope occurred daily especially when he leaned forward or get emotionally worked up. Could not mow the lawn because of being tired out. He was noted to have frequent ectopy/PVCs on telemetry and occasional bradycardia.  Nuclear stress test on 06/11/14 demonstrated inferior scar, large with no inducible ischemia with ejection fraction of 46%.  Echocardiogram on 06/11/14 showed EF of 50-55%.there was akinesis of the basal inferior myocardium.  Edema was noted in hands previously, hydrochlorothiazide was recently started again at 12.5 mg. His blood pressures however remained quite low in the 90s at times, low 100s. I decided to use HCTZ only on as-needed basis because of this however he now takes daily and is doing well. He is no longer on enalapril, metoprolol.   Wt Readings from Last 3 Encounters:  12/09/14 174 lb (78.926 kg)  09/15/14 175 lb (79.379 kg)  08/26/14 175 lb 6.4 oz (79.561 kg)     Past Medical History  Diagnosis Date  . Myocardial infarction   . Hypertension   . Hypercholesteremia   . Coronary artery disease   . Dysrhythmia 06/2014    sinus brady with PVC'S  . Hx of adenomatous colonic polyps 09/21/2014    Past Surgical History  Procedure Laterality Date  . Cardiac surgery      triple bypass  . Coronary artery bypass graft      CABG x 3 Vessels    Current Outpatient Prescriptions  Medication Sig Dispense Refill  . alfuzosin (UROXATRAL) 10 MG  24 hr tablet Take 10 mg by mouth every evening.    Marland Kitchen aspirin EC 81 MG tablet Take 81 mg by mouth daily with breakfast.    . hydrochlorothiazide (HYDRODIURIL) 25 MG tablet TAKE 1/2 TABLET BY MOUTH ONCE A DAY 45 tablet 3  . nitroGLYCERIN (NITROSTAT) 0.4 MG SL tablet Place 1 tablet (0.4 mg total) under the tongue every 5 (five) minutes x 3 doses as needed for chest pain. 25 tablet 12  . simvastatin (ZOCOR) 40 MG tablet Take 40 mg by mouth every evening.     No current facility-administered medications for this visit.    Allergies:   No Known Allergies  Social History:  The patient  reports that he quit smoking about 12 years ago. He has never used smokeless tobacco. He reports that he drinks alcohol. He reports that he uses illicit drugs (Marijuana).   Family History  Problem Relation Age of Onset  . Diabetic kidney disease Mother   . Kidney disease Mother   . Throat cancer Mother   . Cancer Mother   . Diabetes Mother   . Hypertension Mother   . Other Father     unknown  . Hypertension Father   . Heart attack Neg Hx   . Colon cancer Neg Hx   . Esophageal cancer Neg Hx   . Stomach cancer Neg Hx   . Rectal cancer Neg Hx     ROS:  Please see the history of  present illness.   No further syncope/presyncope, no dizziness, no orthopnea. Edema has improved. No CP.   All other systems reviewed and negative.   PHYSICAL EXAM: VS:  BP 112/80 mmHg  Pulse 60  Ht 5\' 11"  (1.803 m)  Wt 174 lb (78.926 kg)  BMI 24.28 kg/m2 Well nourished, well developed, in no acute distress HEENT: normal, [place]/AT, EOMI Neck: no JVD, normal carotid upstroke, no bruit Cardiac:  normal S1, S2; RRR; no murmur Lungs:  clear to auscultation bilaterally, no wheezing, rhonchi or rales Abd: soft, nontender, no hepatomegaly, no bruits Ext: no edema, 2+ distal pulses Skin: warm and dry GU: deferred Neuro: no focal abnormalities noted, AAO x 3  EKG:  None today     ASSESSMENT AND PLAN:  1. Coronary artery disease  status post bypass-doing well. Stable, no angina. Inferior scar noted on nuclear stress test. Reassuring. 2. Presyncope-no further issues, could have been precipitated by relative hypotension. Both his enalapril and HCTZ were held during hospitalization. His HCTZ was restarted at 12.5 mg after noteworthy hand edema was noted. Doing well. 3. Bradycardia-could've been relative bradycardia at times secondary to PVCs. I will avoid metoprolol. He has tried this in the past and has not tolerated it. On exam, no PVC's. 4. PVCs-no PVCs noted today.  PVCs noted on telemetry in the hospital. Doing well. 5. 12 month follow up.  Signed, Candee Furbish, MD [facility]  12/09/2014 4:04 PM

## 2014-12-09 NOTE — Patient Instructions (Signed)
The current medical regimen is effective;  continue present plan and medications.  Follow up in 1 year with Dr. Skains.  You will receive a letter in the mail 2 months before you are due.  Please call us when you receive this letter to schedule your follow up appointment.  Thank you for choosing [facility]!!     

## 2015-04-04 ENCOUNTER — Other Ambulatory Visit: Payer: 59

## 2015-04-04 DIAGNOSIS — B182 Chronic viral hepatitis C: Secondary | ICD-10-CM

## 2015-04-04 LAB — CBC WITH DIFFERENTIAL/PLATELET
Basophils Absolute: 0.1 10*3/uL (ref 0.0–0.1)
Basophils Relative: 1 % (ref 0–1)
EOS ABS: 0.1 10*3/uL (ref 0.0–0.7)
EOS PCT: 2 % (ref 0–5)
HCT: 38.1 % — ABNORMAL LOW (ref 39.0–52.0)
HEMOGLOBIN: 13 g/dL (ref 13.0–17.0)
LYMPHS ABS: 3 10*3/uL (ref 0.7–4.0)
LYMPHS PCT: 49 % — AB (ref 12–46)
MCH: 28 pg (ref 26.0–34.0)
MCHC: 34.1 g/dL (ref 30.0–36.0)
MCV: 82.1 fL (ref 78.0–100.0)
MPV: 11 fL (ref 8.6–12.4)
Monocytes Absolute: 0.4 10*3/uL (ref 0.1–1.0)
Monocytes Relative: 7 % (ref 3–12)
Neutro Abs: 2.5 10*3/uL (ref 1.7–7.7)
Neutrophils Relative %: 41 % — ABNORMAL LOW (ref 43–77)
Platelets: 193 10*3/uL (ref 150–400)
RBC: 4.64 MIL/uL (ref 4.22–5.81)
RDW: 13.5 % (ref 11.5–15.5)
WBC: 6.1 10*3/uL (ref 4.0–10.5)

## 2015-04-04 LAB — IRON: Iron: 90 ug/dL (ref 42–165)

## 2015-04-05 LAB — HEPATITIS A ANTIBODY, TOTAL: HEP A TOTAL AB: NONREACTIVE

## 2015-04-05 LAB — PROTIME-INR
INR: 1.11 (ref <1.50)
Prothrombin Time: 14.3 seconds (ref 11.6–15.2)

## 2015-04-05 LAB — HEPATITIS B CORE ANTIBODY, TOTAL: Hep B Core Total Ab: REACTIVE — AB

## 2015-04-05 LAB — HEPATITIS B SURFACE ANTIGEN: HEP B S AG: NEGATIVE

## 2015-04-05 LAB — HIV ANTIBODY (ROUTINE TESTING W REFLEX): HIV 1&2 Ab, 4th Generation: NONREACTIVE

## 2015-04-05 LAB — ANA: ANA: NEGATIVE

## 2015-04-05 LAB — HEPATITIS B SURFACE ANTIBODY,QUALITATIVE: Hep B S Ab: NEGATIVE

## 2015-04-13 ENCOUNTER — Emergency Department (HOSPITAL_COMMUNITY)
Admission: EM | Admit: 2015-04-13 | Discharge: 2015-04-13 | Disposition: A | Discharge status: Home / Self Care | Payer: 59 | Attending: Emergency Medicine | Admitting: Emergency Medicine

## 2015-04-13 ENCOUNTER — Encounter (HOSPITAL_COMMUNITY): Payer: Self-pay | Admitting: Emergency Medicine

## 2015-04-13 ENCOUNTER — Encounter: Payer: Self-pay | Admitting: Internal Medicine

## 2015-04-13 ENCOUNTER — Ambulatory Visit (INDEPENDENT_AMBULATORY_CARE_PROVIDER_SITE_OTHER): Payer: 59 | Admitting: Internal Medicine

## 2015-04-13 VITALS — BP 136/90 | HR 62 | Temp 98.8°F | Ht 71.0 in | Wt 170.0 lb

## 2015-04-13 DIAGNOSIS — B182 Chronic viral hepatitis C: Secondary | ICD-10-CM | POA: Diagnosis not present

## 2015-04-13 DIAGNOSIS — Z7982 Long term (current) use of aspirin: Secondary | ICD-10-CM | POA: Insufficient documentation

## 2015-04-13 DIAGNOSIS — K029 Dental caries, unspecified: Secondary | ICD-10-CM | POA: Insufficient documentation

## 2015-04-13 DIAGNOSIS — Z86018 Personal history of other benign neoplasm: Secondary | ICD-10-CM | POA: Diagnosis not present

## 2015-04-13 DIAGNOSIS — K002 Abnormalities of size and form of teeth: Secondary | ICD-10-CM | POA: Diagnosis not present

## 2015-04-13 DIAGNOSIS — I10 Essential (primary) hypertension: Secondary | ICD-10-CM | POA: Diagnosis not present

## 2015-04-13 DIAGNOSIS — Z8619 Personal history of other infectious and parasitic diseases: Secondary | ICD-10-CM | POA: Insufficient documentation

## 2015-04-13 DIAGNOSIS — K0381 Cracked tooth: Secondary | ICD-10-CM | POA: Diagnosis not present

## 2015-04-13 DIAGNOSIS — Z87891 Personal history of nicotine dependence: Secondary | ICD-10-CM | POA: Insufficient documentation

## 2015-04-13 DIAGNOSIS — I252 Old myocardial infarction: Secondary | ICD-10-CM | POA: Insufficient documentation

## 2015-04-13 DIAGNOSIS — K088 Other specified disorders of teeth and supporting structures: Secondary | ICD-10-CM | POA: Insufficient documentation

## 2015-04-13 DIAGNOSIS — I2510 Atherosclerotic heart disease of native coronary artery without angina pectoris: Secondary | ICD-10-CM | POA: Diagnosis not present

## 2015-04-13 DIAGNOSIS — K0889 Other specified disorders of teeth and supporting structures: Secondary | ICD-10-CM

## 2015-04-13 DIAGNOSIS — E780 Pure hypercholesterolemia: Secondary | ICD-10-CM | POA: Diagnosis not present

## 2015-04-13 HISTORY — DX: Unspecified viral hepatitis C without hepatic coma: B19.20

## 2015-04-13 MED ORDER — OXYCODONE HCL 5 MG PO TABS: 5.0000 mg | ORAL_TABLET | ORAL | Status: DC | PRN

## 2015-04-13 MED ORDER — IBUPROFEN 600 MG PO TABS: 600.0000 mg | ORAL_TABLET | Freq: Four times a day (QID) | ORAL | Status: DC | PRN

## 2015-04-13 MED ORDER — AMOXICILLIN 500 MG PO CAPS: 500.0000 mg | ORAL_CAPSULE | Freq: Three times a day (TID) | ORAL | Status: DC

## 2015-04-13 MED ORDER — OXYCODONE HCL 5 MG PO TABS
5.0000 mg | ORAL_TABLET | Freq: Once | ORAL | Status: AC
  Administered 2015-04-13: 5 mg via ORAL
  Filled 2015-04-13: qty 1

## 2015-04-13 MED ORDER — AMOXICILLIN 500 MG PO CAPS
500.0000 mg | ORAL_CAPSULE | Freq: Once | ORAL | Status: AC
  Administered 2015-04-13: 500 mg via ORAL
  Filled 2015-04-13: qty 1

## 2015-04-13 MED ORDER — LEDIPASVIR-SOFOSBUVIR 90-400 MG PO TABS: 1.0000 | ORAL_TABLET | Freq: Every day | ORAL | Status: DC

## 2015-04-13 NOTE — Patient Instructions (Signed)
Date 04/13/2015  Dear Mr. Cansler, As discussed in the [facility], your hepatitis C therapy will include the following medications:          Harvoni 90mg /400mg  tablet:           Take 1 tablet by mouth once daily   Please note that ALL MEDICATIONS WILL START ON THE SAME DATE for a total of 12 weeks. ---------------------------------------------------------------- Your HCV Treatment Start Date: TBA   Your HCV genotype:  1a    Liver Fibrosis: TBD    ---------------------------------------------------------------- YOUR PHARMACY CONTACT:   [facility] [place] Phone: [phone] Hours: Monday to Friday 7:30 am to 6:00 pm   Please always contact your pharmacy at least 3-4 business days before you run out of medications to ensure your next month's medication is ready or 1 week prior to running out if you receive it by mail.  Remember, each prescription is for 28 days. ---------------------------------------------------------------- GENERAL NOTES REGARDING YOUR HEPATITIS C MEDICATION:  SOFOSBUVIR/LEDIPASVIR (HARVONI): - Harvoni tablet is taken daily with OR without food. - The tablets are orange. - The tablets should be stored at room temperature.  - Acid reducing agents such as H2 blockers (ie. Pepcid (famotidine), Zantac (ranitidine), Tagamet (cimetidine), Axid (nizatidine) and proton pump inhibitors (ie. Prilosec (omeprazole), Protonix (pantoprazole), Nexium (esomeprazole), or Aciphex (rabeprazole)) can decrease effectiveness of Harvoni. Do not take until you have discussed with a health care provider.    -Antacids that contain magnesium and/or aluminum hydroxide (ie. Milk of Magensia, Rolaids, Gaviscon, Maalox, Mylanta, an dArthritis Pain Formula)can reduce absorption of Harvoni, so take them at least 4 hours before or after Harvoni.  -Calcium carbonate (calcium supplements or antacids such as Tums, Caltrate,  Os-Cal)needs to be taken at least 4 hours hours before or after Harvoni.  -St. John's wort or any products that contain St. John's wort like some herbal supplements  Please inform the office prior to starting any of these medications.  - The common side effects with Harvoni:      1. Fatigue      2. Headache      3. Nausea      4. Diarrhea      5. Insomnia   Support Path is a suite of resources designed to help patients start with HARVONI and move toward treatment completion [place] helps patients access therapy and get off to an efficient start  Benefits investigation and prior authorization support Co-pay and other financial assistance A specialty pharmacy finder CO-PAY COUPON The [place] co-pay coupon may help eligible patients lower their out-of-pocket costs. With a co-pay coupon, most eligible patients may pay no more than $5 per co-pay (restrictions apply) www.harvoni.com call 825-441-2027 Not valid for patients enrolled in government healthcare prescription drug programs, such as Medicare Part D and Medicaid. Patients in the coverage gap known as the "donut hole" also are not eligible The HARVONI co-pay coupon program will cover the out-of-pocket costs for HARVONI prescriptions up to a maximum of 25% of the catalog price of a 12-week regimen of HARVONI  Please note that this only lists the most common side effects and is NOT a comprehensive list of the potential side effects of these medications. For more information, please review the drug information sheets that come with your medication package from the pharmacy.  ---------------------------------------------------------------- GENERAL HELPFUL HINTS ON HCV THERAPY: 1. No alcohol. 2. Protect against sun-sensitivity/sunburns (wear sunglasses, hat, long sleeves, pants  and sunscreen). 3. Stay well-hydrated/well-moisturized. 4. Notify the [facility] of any changes in your other over-the-counter/herbal or  prescription medications. 5. If you miss a dose of your medication, take the missed dose as soon as you remember. Return to your regular time/dose schedule the next day.  6.  Do not stop taking your medications without first talking with your healthcare provider. 7.  You may take Tylenol (acetaminophen), as long as the dose is less than 2000 mg (OR no more than 4 tablets of the Tylenol Extra Strengths '500mg'$  tablet) in 24 hours. 8.  You will need to obtain routine labs and/or office visits at [facility] at weeks 4 and 12 as well as 12 and 24 weeks after completion of treatment.   Scharlene Gloss, [place] for [place] [address], [place]  [address] [phone]

## 2015-04-13 NOTE — ED Provider Notes (Signed)
CSN: [id number]     Arrival date & time 04/13/15  2211 History  This chart was scribed for non-physician practitioner Antonietta Breach, PA-C working with Pamella Pert, MD by Meriel Pica, ED Scribe. This patient was seen in room WTR7/WTR7 and the patient's care was started at 10:45 PM.   Chief Complaint  Patient presents with  . Dental Pain   Patient is a 59 y.o. male presenting with tooth pain. The history is provided by the patient. No language interpreter was used.  Dental Pain Location:  Lower Severity:  Moderate Onset quality:  Gradual Duration:  3 days Timing:  Intermittent Progression:  Worsening Context: abscess   Relieved by:  Nothing Ineffective treatments:  NSAIDs Associated symptoms: no drooling and no fever    HPI Comments: Nicholas [name] is a 59 y.o. male, who presents to the Emergency Department complaining of a gradually worsening, intermittent, moderate area of pain and swelling in his lower oral region onset 3 days ago. Pt reports the painful area has been limiting how much he can open his mouth. He has been taking ibuprofen with mild to no relief. Pt has called to make an appt with his dentist but reports he needs to settle outstanding bills before he can get an appt. He denies having any drainage from the area or fevers.   Past Medical History  Diagnosis Date  . Myocardial infarction   . Hypertension   . Hypercholesteremia   . Coronary artery disease   . Dysrhythmia 06/2014    sinus brady with PVC'S  . Hx of adenomatous colonic polyps 09/21/2014  . Hepatitis C    Past Surgical History  Procedure Laterality Date  . Cardiac surgery      triple bypass  . Coronary artery bypass graft      CABG x 3 Vessels   Family History  Problem Relation Age of Onset  . Diabetic kidney disease Mother   . Kidney disease Mother   . Throat cancer Mother   . Cancer Mother   . Diabetes Mother   . Hypertension Mother   . Other Father     unknown  . Hypertension Father    . Heart attack Neg Hx   . Colon cancer Neg Hx   . Esophageal cancer Neg Hx   . Stomach cancer Neg Hx   . Rectal cancer Neg Hx    History  Substance Use Topics  . Smoking status: Former Smoker    Quit date: 06/10/2002  . Smokeless tobacco: Never Used  . Alcohol Use: Yes     Comment: social    Review of Systems  Constitutional: Negative for fever.  HENT: Positive for dental problem. Negative for drooling.   All other systems reviewed and are negative.   Allergies  Review of patient's allergies indicates no known allergies.  Home Medications   Prior to Admission medications   Medication Sig Start Date End Date Taking? Authorizing Provider  alfuzosin (UROXATRAL) 10 MG 24 hr tablet Take 10 mg by mouth every evening.    Historical Provider, MD  amoxicillin (AMOXIL) 500 MG capsule Take 1 capsule (500 mg total) by mouth 3 (three) times daily. 04/13/15   Antonietta Breach, PA-C  aspirin EC 81 MG tablet Take 81 mg by mouth daily with breakfast.    Historical Provider, MD  hydrochlorothiazide (HYDRODIURIL) 25 MG tablet TAKE 1/2 TABLET BY MOUTH ONCE A DAY 10/12/14   Jerline Pain, MD  ibuprofen (ADVIL,MOTRIN) 600 MG tablet Take 1 tablet (  600 mg total) by mouth every 6 (six) hours as needed. 04/13/15   Antonietta Breach, PA-C  Ledipasvir-Sofosbuvir (HARVONI) 90-400 MG TABS Take 1 tablet by mouth daily. 04/13/15   Thayer Headings, MD  nitroGLYCERIN (NITROSTAT) 0.4 MG SL tablet Place 1 tablet (0.4 mg total) under the tongue every 5 (five) minutes x 3 doses as needed for chest pain. 06/12/14   Eileen Stanford, PA-C  oxyCODONE (ROXICODONE) 5 MG immediate release tablet Take 1 tablet (5 mg total) by mouth every 4 (four) hours as needed for severe pain. 04/13/15   Antonietta Breach, PA-C  simvastatin (ZOCOR) 40 MG tablet Take 40 mg by mouth every evening.    Historical Provider, MD   BP 110/85 mmHg  Pulse 73  Temp(Src) 98.8 F (37.1 C) (Oral)  Resp 18  Ht 5\' 11"  (1.803 m)  Wt 170 lb (77.111 kg)  BMI 23.72 kg/m2   SpO2 98%  Physical Exam  Constitutional: He is oriented to person, place, and time. He appears well-developed and well-nourished. No distress.  Nontoxic/nonseptic appearing  HENT:  Head: Normocephalic and atraumatic.  Mouth/Throat: Uvula is midline and oropharynx is clear and moist. No oral lesions. Abnormal dentition. Dental caries present. No uvula swelling.    Multiple dental caries and absent dentition. Cracked right lower canine with gingival swelling and erythema noted around the central and lateral right lower incisors as well as the right lower canine. Swelling extends inferiorly. No gingival fluctuance.  Eyes: Conjunctivae and EOM are normal. No scleral icterus.  Neck: Normal range of motion.  No nuchal rigidity or meningismus. No stridor.  Cardiovascular: Normal rate, regular rhythm and intact distal pulses.   Pulmonary/Chest: Effort normal. No respiratory distress.  Respirations even and unlabored  Musculoskeletal: Normal range of motion.  Neurological: He is alert and oriented to person, place, and time. He exhibits normal muscle tone. Coordination normal.  Skin: Skin is warm and dry. No rash noted. He is not diaphoretic. No erythema. No pallor.  Psychiatric: He has a normal mood and affect. His behavior is normal.  Nursing note and vitals reviewed.   ED Course  Procedures  DIAGNOSTIC STUDIES: Oxygen Saturation is 98% on RA, normal by my interpretation.    COORDINATION OF CARE: 10:49 PM Discussed treatment plan which includes to order and prescribe amoxicillin and order pain medication with pt. Pt acknowledges and agrees to plan.   Labs Review Labs Reviewed - No data to display  Imaging Review No results found.   EKG Interpretation None      MDM   Final diagnoses:  Dentalgia    Patient with toothache. Suspect early abscess; no gingival fluctuance. Exam unconcerning for Ludwig's angina or spread of infection. Will treat with Amoxicillin and pain medicine.  Urged patient to follow-up with dentist. Return precautions and resource guide provided. Patient discharged in good condition with no unaddressed concerns.  I personally performed the services described in this documentation, which was scribed in my presence. The recorded information has been reviewed and is accurate.   Filed Vitals:   04/13/15 2238  BP: 110/85  Pulse: 73  Temp: 98.8 F (37.1 C)  TempSrc: Oral  Resp: 18  Height: 5\' 11"  (1.803 m)  Weight: 170 lb (77.111 kg)  SpO2: 98%      Antonietta Breach, PA-C 04/13/15 [place], MD 04/13/15 [phone]

## 2015-04-13 NOTE — Progress Notes (Signed)
+Nicholas [name] is a 59 y.o. male who presents for initial evaluation and management of a positive Hepatitis C antibody test.  Patient tested positive this year during life insurance exam. Hepatitis C risk factors present are: none. Patient denies history of blood transfusion, intranasal drug use, IV drug abuse, multiple sexual partners, renal dialysis, sexual contact with person with liver disease, tattoos. Patient has had other studies performed. Results: hepatitis C RNA by PCR, result: positive. Patient has not had prior treatment for Hepatitis C. Patient does not have a past history of liver disease. Patient does not have a family history of liver disease.   HPI: Tested during life insurance exam.  No daily drinking.  No drug use.   Patient does not have documented immunity to Hepatitis A. Patient does have documented immunity to Hepatitis B.     Review of Systems A comprehensive review of systems was negative.   Past Medical History  Diagnosis Date  . Myocardial infarction   . Hypertension   . Hypercholesteremia   . Coronary artery disease   . Dysrhythmia 06/2014    sinus brady with PVC'S  . Hx of adenomatous colonic polyps 09/21/2014    Prior to Admission medications   Medication Sig Start Date End Date Taking? Authorizing Provider  alfuzosin (UROXATRAL) 10 MG 24 hr tablet Take 10 mg by mouth every evening.   Yes Historical Provider, MD  aspirin EC 81 MG tablet Take 81 mg by mouth daily with breakfast.   Yes Historical Provider, MD  hydrochlorothiazide (HYDRODIURIL) 25 MG tablet TAKE 1/2 TABLET BY MOUTH ONCE A DAY 10/12/14  Yes Jerline Pain, MD  nitroGLYCERIN (NITROSTAT) 0.4 MG SL tablet Place 1 tablet (0.4 mg total) under the tongue every 5 (five) minutes x 3 doses as needed for chest pain. 06/12/14  Yes Eileen Stanford, PA-C  simvastatin (ZOCOR) 40 MG tablet Take 40 mg by mouth every evening.   Yes Historical Provider, MD  Ledipasvir-Sofosbuvir (HARVONI) 90-400 MG TABS Take 1  tablet by mouth daily. 04/13/15   Thayer Headings, MD    No Known Allergies  History  Substance Use Topics  . Smoking status: Former Smoker    Quit date: 06/10/2002  . Smokeless tobacco: Never Used  . Alcohol Use: Yes     Comment: social    Family History  Problem Relation Age of Onset  . Diabetic kidney disease Mother   . Kidney disease Mother   . Throat cancer Mother   . Cancer Mother   . Diabetes Mother   . Hypertension Mother   . Other Father     unknown  . Hypertension Father   . Heart attack Neg Hx   . Colon cancer Neg Hx   . Esophageal cancer Neg Hx   . Stomach cancer Neg Hx   . Rectal cancer Neg Hx       Objective:   Filed Vitals:   04/13/15 1522  BP: 136/90  Pulse: 62  Temp: 98.8 F (37.1 C)   in no apparent distress and alert HEENT: anicteric Cor RRR and No murmurs clear Bowel sounds are normal, liver is not enlarged, spleen is not enlarged peripheral pulses normal, no pedal edema, no clubbing or cyanosis negative for - jaundice, spider hemangioma, telangiectasia, palmar erythema, ecchymosis and atrophy  Laboratory Genotype: No results found for: HCVGENOTYPE HCV viral load: No results found for: HCVQUANT Lab Results  Component Value Date   WBC 6.1 04/04/2015   HGB 13.0 04/04/2015  HCT 38.1* 04/04/2015   MCV 82.1 04/04/2015   PLT 193 04/04/2015    Lab Results  Component Value Date   CREATININE 1.38* 06/11/2014   BUN 21 06/11/2014   NA 140 06/11/2014   K 5.2 06/11/2014   CL 105 06/11/2014   CO2 28 06/11/2014    Lab Results  Component Value Date   ALT 26 06/11/2014   AST 28 06/11/2014   ALKPHOS 45 06/11/2014   BILITOT 0.3 06/11/2014   INR 1.11 04/04/2015      Assessment: Chronic Hepatitis C genotype 1a  Plan: 1) Patient counseled extensively on limiting acetaminophen to no more than 2 grams daily, avoidance of alcohol. 2) Transmission discussed with patient including sexual transmission, sharing razors and toothbrush.   3) Will  need referral to gastroenterology if concern for cirrhosis 4) Will need referral for substance abuse counseling: No. 5) Will prescribe Harvoni for 12 weeks once work up complete 6) Hepatitis A vaccine Yes.  will do next visit 7) Hepatitis B vaccine No. 8) Pneumovax vaccine if concern for cirrhosis 9) will follow up after elastography 10) will dose reduce simvistatin to 1/2 current dose but continue due to CAD and history of CABG.

## 2015-04-13 NOTE — ED Notes (Signed)
Pt c/o r side dental pain x1 day.  Rates pain 10/10.

## 2015-04-13 NOTE — Discharge Instructions (Signed)
Dental Abscess A dental abscess is a collection of infected fluid (pus) from a bacterial infection in the inner part of the tooth (pulp). It usually occurs at the end of the tooth's root.  CAUSES   Severe tooth decay.  Trauma to the tooth that allows bacteria to enter into the pulp, such as a broken or chipped tooth. SYMPTOMS   Severe pain in and around the infected tooth.  Swelling and redness around the abscessed tooth or in the mouth or face.  Tenderness.  Pus drainage.  Bad breath.  Bitter taste in the mouth.  Difficulty swallowing.  Difficulty opening the mouth.  Nausea.  Vomiting.  Chills.  Swollen neck glands. DIAGNOSIS   A medical and dental history will be taken.  An examination will be performed by tapping on the abscessed tooth.  X-rays may be taken of the tooth to identify the abscess. TREATMENT The goal of treatment is to eliminate the infection. You may be prescribed antibiotic medicine to stop the infection from spreading. A root canal may be performed to save the tooth. If the tooth cannot be saved, it may be pulled (extracted) and the abscess may be drained.  HOME CARE INSTRUCTIONS  Only take over-the-counter or prescription medicines for pain, fever, or discomfort as directed by your caregiver.  Rinse your mouth (gargle) often with salt water ( tsp salt in 8 oz [250 ml] of warm water) to relieve pain or swelling.  Do not drive after taking pain medicine (narcotics).  Do not apply heat to the outside of your face.  Return to your dentist for further treatment as directed. SEEK MEDICAL CARE IF:  Your pain is not helped by medicine.  Your pain is getting worse instead of better. SEEK IMMEDIATE MEDICAL CARE IF:  You have a fever.  You have a fever and your symptoms suddenly get worse.  You have chills or a very bad headache.  You have problems breathing or swallowing.  You have trouble opening your mouth.  You have swelling in the  neck or around the eye. Document Released: 09/24/2005 Document Revised: 06/18/2012 Document Reviewed: 01/02/2011 [facility] Patient Information 2015 [place], [place]. This information is not intended to replace advice given to you by your health care provider. Make sure you discuss any questions you have with your health care provider.   Emergency Department Resource Guide 1) Find a Doctor and Pay Out of Pocket Although you won't have to find out who is covered by your insurance plan, it is a good idea to ask around and get recommendations. You will then need to call the office and see if the doctor you have chosen will accept you as a new patient and what types of options they offer for patients who are self-pay. Some doctors offer discounts or will set up payment plans for their patients who do not have insurance, but you will need to ask so you aren't surprised when you get to your appointment.  2) Contact Your Local Health Department Not all health departments have doctors that can see patients for sick visits, but many do, so it is worth a call to see if yours does. If you don't know where your local health department is, you can check in your phone book. The CDC also has a tool to help you locate your state's health department, and many state websites also have listings of all of their local health departments.  3) Find a [facility] If your illness is not likely to  be very severe or complicated, you may want to try a walk in clinic. These are popping up all over the country in pharmacies, drugstores, and shopping centers. They're usually staffed by nurse practitioners or physician assistants that have been trained to treat common illnesses and complaints. They're usually fairly quick and inexpensive. However, if you have serious medical issues or chronic medical problems, these are probably not your best option.  No Primary Care Doctor: - Call Health Connect at  [phone] - they can help you  locate a primary care doctor that  accepts your insurance, provides certain services, etc. - Physician Referral Service- [phone]  Chronic Pain Problems: Organization         Address  Phone   Notes  [facility]  [phone] Patients need to be referred by their primary care doctor.   Medication Assistance: Organization         Address  Phone   Notes  [facility] Medication [facility] [address] [place], [place] [address] [phone] --Must be a resident of [facility] -- Must have NO insurance coverage whatsoever (no Medicaid/ Medicare, etc.) -- The pt. MUST have a primary care doctor that directs their care regularly and follows them in the community   [facility]  [phone]   [employer]  [phone]    Agencies that provide inexpensive medical care: Organization         Address  Phone   Notes  [place]  [phone]   [facility]    [phone]   [facility] [place], [place] [address] [phone]   [place] [address] [place] [phone]   Planned Parenthood    [phone]   [facility]    [phone]   [place] and [place] [address], [place] Phone:  [phone], Fax:  [phone] Hours of Operation:  9 am - 6 pm, M-F.  Also accepts Medicaid/Medicare and self-pay.  [facility] [place] for [place] [address], [address], [place] Phone: [phone], Fax: [phone]. Hours of Operation:  8:30 am - 5:30 pm, M-F.  Also accepts Medicaid and self-pay.  [facility] [place] [address] [place] Phone: [phone]   [place], [place], [place] [phone], Ext. [phone] Mondays & Thursdays: 7-9 AM.  First 15 patients are seen on a first come, first serve basis.    [place]  Providers:  Organization         Address  Phone   Notes  [facility] [address], [place] [phone] Also accepts self-pay patients.  [facility] [address], [place]  [phone]   [place], [address], [place] [phone]   [facility] [address], [place] [phone]   Lucianne Lei [address], [place]   [phone] Only accepts [place] Access [place] patients after they have their name applied to their card.   Self-Pay (no insurance) in [facility]:  Organization         Address  Phone   Notes  Sickle Cell Patients, [facility] [place] [phone]   [facility] [place] [phone]   [facility] [place]  [place], [address], [place] [phone]   [facility]/Dr. Osei-Bonsu  [address] [place] or [address], [place] [phone] Phone number for both [place] and [place] locations is the same.  [facility] [address], [place] [phone]   [facility] [address] [place] or [address] [phone]   [facility] [address] [place] [phone], phone; [phone], fax Sees patients 1st and 3rd Saturday of every month.  Must not qualify for public or private insurance (i.e. Medicaid, Medicare, [place] Health Choice, Veterans' Benefits)  Household income should be no more than 200% of the poverty level The clinic cannot treat you if you are pregnant or think you are pregnant  Sexually transmitted diseases are not treated at the clinic.    Dental Care: Organization         Address  Phone  Notes  [facility] [place] [phone] Accepts children up to age 19 who are enrolled in [place] or [place]; pregnant women with a Medicaid card; and children who have applied for Medicaid or [place] Health Choice, but were declined, whose parents can pay a reduced fee at time of service.  [facility] of [facility]  [address], [place] [phone] Accepts children up to age 73 who are enrolled in [place] or [place]; pregnant women with a Medicaid card; and children who have applied for Medicaid or [place] Health Choice, but were declined, whose parents can pay a reduced fee at time of service.  [place] [facility] PRO[facility]  [place] [phone] Patients are seen by appointment only. Walk-ins are not accepted. [place] will see patients 19 years of age and older. Monday - Tuesday (8am-5pm) Most Wednesdays (8:30-5pm) $30 per visit, cash only  [facility] Adult Dental Access PROGRAM  [address], [facility] [phone] Patients are seen by appointment only. Walk-ins are not accepted. [place] will see patients 71 years of age and older. One Wednesday Evening (Monthly: Volunteer Based).  $30 per visit, cash only  [place]  [phone] for adults; Children under age 3, call Graduate Pediatric Dentistry at [phone]. Children aged 9-14, please call [phone] to request a pediatric application.  Dental services are provided in all areas of dental care including fillings, crowns and bridges, complete and partial dentures, implants, gum treatment, root canals, and extractions. Preventive care is also provided. Treatment is provided to both adults and children. Patients are selected via a lottery and there is often a waiting list.   [facility] [address] [place]  [phone] [email or web address]   [facility]  [address] [place], [place] [phone], Ext. [phone] Second and Fourth Thursday of each month, opens at 6:30 AM; Clinic ends at 9 AM.  Patients are seen on a first-come first-served basis, and a limited number are seen during each clinic.   [facility]  [address] Hillard Danker [place], [place] [phone]   Eligibility Requirements You must have lived in [place], [place], or [place] counties for at least the last three months.  You cannot be eligible for state or federal sponsored [employer], including [employer], [place], or [employer].   You generally cannot be eligible for healthcare insurance through your employer.    How to apply: Eligibility screenings are held every Tuesday and Wednesday afternoon from 1:00 pm until 4:00 pm. You do not need an appointment for the interview!  [facility] [address] [place], [place]   [place]  [place]  [place]  [phone]

## 2015-04-25 ENCOUNTER — Encounter (HOSPITAL_COMMUNITY): Payer: Self-pay | Admitting: Pharmacy Technician

## 2015-04-26 ENCOUNTER — Other Ambulatory Visit: Payer: Self-pay | Admitting: Cardiology

## 2015-05-04 ENCOUNTER — Ambulatory Visit (HOSPITAL_COMMUNITY)
Admission: RE | Admit: 2015-05-04 | Discharge: 2015-05-04 | Disposition: A | Discharge status: Home / Self Care | Payer: 59 | Source: Ambulatory Visit | Attending: Internal Medicine | Admitting: Internal Medicine

## 2015-05-04 DIAGNOSIS — B182 Chronic viral hepatitis C: Secondary | ICD-10-CM | POA: Diagnosis present

## 2015-05-09 ENCOUNTER — Other Ambulatory Visit: Payer: 59

## 2015-05-09 ENCOUNTER — Other Ambulatory Visit: Payer: Self-pay | Admitting: Internal Medicine

## 2015-05-09 DIAGNOSIS — B1920 Unspecified viral hepatitis C without hepatic coma: Secondary | ICD-10-CM

## 2015-05-09 DIAGNOSIS — Z5181 Encounter for therapeutic drug level monitoring: Secondary | ICD-10-CM

## 2015-05-09 LAB — CBC WITH DIFFERENTIAL/PLATELET
Basophils Absolute: 0 10*3/uL (ref 0.0–0.1)
Basophils Relative: 0 % (ref 0–1)
EOS ABS: 0.1 10*3/uL (ref 0.0–0.7)
EOS PCT: 2 % (ref 0–5)
HCT: 40.5 % (ref 39.0–52.0)
HEMOGLOBIN: 13.4 g/dL (ref 13.0–17.0)
LYMPHS ABS: 3 10*3/uL (ref 0.7–4.0)
Lymphocytes Relative: 45 % (ref 12–46)
MCH: 27.6 pg (ref 26.0–34.0)
MCHC: 33.1 g/dL (ref 30.0–36.0)
MCV: 83.3 fL (ref 78.0–100.0)
MONO ABS: 0.7 10*3/uL (ref 0.1–1.0)
MPV: 10.8 fL (ref 8.6–12.4)
Monocytes Relative: 10 % (ref 3–12)
NEUTROS PCT: 43 % (ref 43–77)
Neutro Abs: 2.9 10*3/uL (ref 1.7–7.7)
PLATELETS: 194 10*3/uL (ref 150–400)
RBC: 4.86 MIL/uL (ref 4.22–5.81)
RDW: 13.5 % (ref 11.5–15.5)
WBC: 6.7 10*3/uL (ref 4.0–10.5)

## 2015-05-09 LAB — COMPREHENSIVE METABOLIC PANEL
ALK PHOS: 51 U/L (ref 40–115)
ALT: 9 U/L (ref 9–46)
AST: 19 U/L (ref 10–35)
Albumin: 4.1 g/dL (ref 3.6–5.1)
BILIRUBIN TOTAL: 0.3 mg/dL (ref 0.2–1.2)
BUN: 19 mg/dL (ref 7–25)
CALCIUM: 9.5 mg/dL (ref 8.6–10.3)
CO2: 26 mmol/L (ref 20–31)
Chloride: 104 mmol/L (ref 98–110)
Creat: 1.25 mg/dL (ref 0.70–1.33)
Glucose, Bld: 88 mg/dL (ref 65–99)
POTASSIUM: 4.3 mmol/L (ref 3.5–5.3)
Sodium: 137 mmol/L (ref 135–146)
Total Protein: 7.4 g/dL (ref 6.1–8.1)

## 2015-05-11 LAB — HEPATITIS C RNA QUANTITATIVE: HCV Quantitative: NOT DETECTED IU/mL (ref <15)

## 2015-05-12 ENCOUNTER — Ambulatory Visit (INDEPENDENT_AMBULATORY_CARE_PROVIDER_SITE_OTHER): Payer: 59 | Admitting: Internal Medicine

## 2015-05-12 ENCOUNTER — Encounter: Payer: Self-pay | Admitting: Internal Medicine

## 2015-05-12 VITALS — BP 136/86 | HR 55 | Temp 98.1°F | Wt 170.0 lb

## 2015-05-12 DIAGNOSIS — Z23 Encounter for immunization: Secondary | ICD-10-CM

## 2015-05-12 DIAGNOSIS — B182 Chronic viral hepatitis C: Secondary | ICD-10-CM | POA: Diagnosis not present

## 2015-05-13 ENCOUNTER — Other Ambulatory Visit: Payer: Self-pay | Admitting: Licensed Clinical Social Worker

## 2015-05-13 DIAGNOSIS — Z5181 Encounter for therapeutic drug level monitoring: Secondary | ICD-10-CM

## 2015-05-13 LAB — CK: Total CK: 225 U/L (ref 7–232)

## 2015-05-13 NOTE — Progress Notes (Signed)
   Subjective:    Patient ID: Nicholas [name], male    DOB: [birth year], 59 y.o.   MRN: [id number]  HPI Here for follow up of HCV.  Genotype 1a, F0/1 on elastography, early viral load now undetectable.  No muscle aches, mild headaches, no fatigue. Feels well and pleased with results.     Review of Systems  Constitutional: Negative for fatigue.  Gastrointestinal: Negative for nausea and diarrhea.  Skin: Negative for rash.  Neurological: Positive for headaches. Negative for dizziness and light-headedness.       Objective:   Physical Exam  Constitutional: He appears well-developed and well-nourished. No distress.  HENT:  Mouth/Throat: No oropharyngeal exudate.  Eyes: No scleral icterus.  Cardiovascular: Normal rate, regular rhythm and normal heart sounds.   No murmur heard. Pulmonary/Chest: Effort normal and breath sounds normal. No respiratory distress. He has no wheezes.  Lymphadenopathy:    He has no cervical adenopathy.  Skin: No rash noted.          Assessment & Plan:

## 2015-05-13 NOTE — Assessment & Plan Note (Signed)
No issues on statin.  will check CK.

## 2015-05-13 NOTE — Assessment & Plan Note (Signed)
Doing well with Harovni.  Continue 12 weeks. Labs discussed with him.

## 2015-06-09 ENCOUNTER — Emergency Department (INDEPENDENT_AMBULATORY_CARE_PROVIDER_SITE_OTHER)
Admission: EM | Admit: 2015-06-09 | Discharge: 2015-06-09 | Disposition: A | Discharge status: Home / Self Care | Payer: 59 | Source: Home / Self Care | Attending: Emergency Medicine | Admitting: Emergency Medicine

## 2015-06-09 ENCOUNTER — Encounter (HOSPITAL_COMMUNITY): Payer: Self-pay | Admitting: Emergency Medicine

## 2015-06-09 DIAGNOSIS — K0889 Other specified disorders of teeth and supporting structures: Secondary | ICD-10-CM

## 2015-06-09 DIAGNOSIS — K089 Disorder of teeth and supporting structures, unspecified: Secondary | ICD-10-CM

## 2015-06-09 DIAGNOSIS — K088 Other specified disorders of teeth and supporting structures: Secondary | ICD-10-CM | POA: Diagnosis not present

## 2015-06-09 MED ORDER — HYDROCODONE-ACETAMINOPHEN 5-325 MG PO TABS: 1.0000 | ORAL_TABLET | ORAL | Status: DC | PRN

## 2015-06-09 MED ORDER — AMOXICILLIN 500 MG PO CAPS: 1000.0000 mg | ORAL_CAPSULE | Freq: Two times a day (BID) | ORAL | Status: DC

## 2015-06-09 NOTE — ED Notes (Signed)
Pt has been suffering from right lower dental pain and swelling since yesterday.  Pt needs abx before he can make an appointment with a dentist.

## 2015-06-09 NOTE — ED Provider Notes (Signed)
CSN: [id number]     Arrival date & time 06/09/15  86 History   First MD Initiated Contact with Patient 06/09/15 1821     Chief Complaint  Patient presents with  . Dental Pain   (Consider location/radiation/quality/duration/timing/severity/associated sxs/prior Treatment) HPI Comments: 59 year old male complaining of toothache for 2 days. He said he has had no previous problem with this tooth however when confronted with the visit he had to the emergency department last months for this an tooth ache he then confessed that he did go to emergency department but did not call a dentist as he had stated he would. He has very poor dentition. Poor dental repair, gingival erythema with some swelling around the lower right anterior teeth, canine and bicuspid. These teeth are fractured, do needed and cavernous. No abscess formation is seen. No trismus. Full ROM of jaw.No bleeding or purulence.   Past Medical History  Diagnosis Date  . Myocardial infarction   . Hypertension   . Hypercholesteremia   . Coronary artery disease   . Dysrhythmia 06/2014    sinus brady with PVC'S  . Hx of adenomatous colonic polyps 09/21/2014  . Hepatitis C    Past Surgical History  Procedure Laterality Date  . Cardiac surgery      triple bypass  . Coronary artery bypass graft      CABG x 3 Vessels   Family History  Problem Relation Age of Onset  . Diabetic kidney disease Mother   . Kidney disease Mother   . Throat cancer Mother   . Cancer Mother   . Diabetes Mother   . Hypertension Mother   . Other Father     unknown  . Hypertension Father   . Heart attack Neg Hx   . Colon cancer Neg Hx   . Esophageal cancer Neg Hx   . Stomach cancer Neg Hx   . Rectal cancer Neg Hx    Social History  Substance Use Topics  . Smoking status: Former Smoker    Quit date: 06/10/2002  . Smokeless tobacco: Never Used  . Alcohol Use: No    Review of Systems  Constitutional: Negative.   HENT: Positive for dental problem.  Negative for drooling.   Respiratory: Negative.   All other systems reviewed and are negative.   Allergies  Review of patient's allergies indicates no known allergies.  Home Medications   Prior to Admission medications   Medication Sig Start Date End Date Taking? Authorizing Provider  alfuzosin (UROXATRAL) 10 MG 24 hr tablet Take 10 mg by mouth every evening.   Yes Historical Provider, MD  aspirin EC 81 MG tablet Take 81 mg by mouth daily with breakfast.   Yes Historical Provider, MD  hydrochlorothiazide (HYDRODIURIL) 25 MG tablet TAKE 1/2 TABLET BY MOUTH ONCE A DAY 10/12/14  Yes Jerline Pain, MD  ibuprofen (ADVIL,MOTRIN) 600 MG tablet Take 1 tablet (600 mg total) by mouth every 6 (six) hours as needed. 04/13/15  Yes Kelly Humes, PA-C  Ledipasvir-Sofosbuvir (HARVONI) 90-400 MG TABS Take 1 tablet by mouth daily. 04/13/15  Yes Thayer Headings, MD  simvastatin (ZOCOR) 40 MG tablet Take 40 mg by mouth every evening.   Yes Historical Provider, MD  amoxicillin (AMOXIL) 500 MG capsule Take 2 capsules (1,000 mg total) by mouth 2 (two) times daily. 06/09/15   Janne Napoleon, NP  HYDROcodone-acetaminophen (NORCO/VICODIN) 5-325 MG per tablet Take 1 tablet by mouth every 4 (four) hours as needed. 06/09/15   Janne Napoleon, NP  nitroGLYCERIN (NITROSTAT) 0.4 MG SL tablet Place 1 tablet (0.4 mg total) under the tongue every 5 (five) minutes x 3 doses as needed for chest pain. 06/12/14   Eileen Stanford, PA-C   Meds Ordered and Administered this Visit  Medications - No data to display  BP 117/84 mmHg  Pulse 60  Temp(Src) 98.6 F (37 C) (Oral)  Resp 24  SpO2 96% No data found.   Physical Exam  Constitutional: He is oriented to person, place, and time. He appears well-developed and well-nourished. No distress.  HENT:  Mouth/Throat: Oropharynx is clear and moist.  Poor dentition. The right lower incisor canine and bicuspid tooth are fractured and cavernous. There is mild associated gingival erythema and swelling.  No fluctuance or signs of abscess.  Neck: Normal range of motion. Neck supple.  Cardiovascular: Normal rate.   Pulmonary/Chest: Effort normal.  Lymphadenopathy:    He has no cervical adenopathy.  Neurological: He is alert and oriented to person, place, and time.  Skin: Skin is warm and dry.  Psychiatric: He has a normal mood and affect.  Nursing note and vitals reviewed.   ED Course  Procedures (including critical care time)  Labs Review Labs Reviewed - No data to display  Imaging Review No results found.   Visual Acuity Review  Right Eye Distance:   Left Eye Distance:   Bilateral Distance:    Right Eye Near:   Left Eye Near:    Bilateral Near:         MDM   1. Toothache   2. Poor dentition    norco 5 mg #15 Amoxicillin x 10 d Must see dentist ASAP    Janne Napoleon, NP 06/09/15 1836

## 2015-06-09 NOTE — Discharge Instructions (Signed)
Dental Pain °A tooth ache may be caused by cavities (tooth decay). Cavities expose the nerve of the tooth to air and hot or cold temperatures. It may come from an infection or abscess (also called a boil or furuncle) around your tooth. It is also often caused by dental caries (tooth decay). This causes the pain you are having. °DIAGNOSIS  °Your caregiver can diagnose this problem by exam. °TREATMENT  °· If caused by an infection, it may be treated with medications which kill germs (antibiotics) and pain medications as prescribed by your caregiver. Take medications as directed. °· Only take over-the-counter or prescription medicines for pain, discomfort, or fever as directed by your caregiver. °· Whether the tooth ache today is caused by infection or dental disease, you should see your dentist as soon as possible for further care. °SEEK MEDICAL CARE IF: °The exam and treatment you received today has been provided on an emergency basis only. This is not a substitute for complete medical or dental care. If your problem worsens or new problems (symptoms) appear, and you are unable to meet with your dentist, call or return to this location. °SEEK IMMEDIATE MEDICAL CARE IF:  °· You have a fever. °· You develop redness and swelling of your face, jaw, or neck. °· You are unable to open your mouth. °· You have severe pain uncontrolled by pain medicine. °MAKE SURE YOU:  °· Understand these instructions. °· Will watch your condition. °· Will get help right away if you are not doing well or get worse. °Document Released: 09/24/2005 Document Revised: 12/17/2011 Document Reviewed: 05/12/2008 °ExitCare® Patient Information ©2015 [employer]. This information is not intended to replace advice given to you by your health care provider. Make sure you discuss any questions you have with your health care provider. ° °Dental Care and Dentist Visits °Dental care supports good overall health. Regular dental visits can also help you  avoid dental pain, bleeding, infection, and other more serious health problems in the future. It is important to keep the mouth healthy because diseases in the teeth, gums, and other oral tissues can spread to other areas of the body. Some problems, such as diabetes, heart disease, and pre-term labor have been associated with poor oral health.  °See your dentist every 6 months. If you experience emergency problems such as a toothache or broken tooth, go to the dentist right away. If you see your dentist regularly, you may catch problems early. It is easier to be treated for problems in the early stages.  °WHAT TO EXPECT AT A DENTIST VISIT  °Your dentist will look for many common oral health problems and recommend proper treatment. At your regular dental visit, you can expect: °· Gentle cleaning of the teeth and gums. This includes scraping and polishing. This helps to remove the sticky substance around the teeth and gums (plaque). Plaque forms in the mouth shortly after eating. Over time, plaque hardens on the teeth as tartar. If tartar is not removed regularly, it can cause problems. Cleaning also helps remove stains. °· Periodic X-rays. These pictures of the teeth and supporting bone will help your dentist assess the health of your teeth. °· Periodic fluoride treatments. Fluoride is a natural mineral shown to help strengthen teeth. Fluoride treatment involves applying a fluoride gel or varnish to the teeth. It is most commonly done in children. °· Examination of the mouth, tongue, jaws, teeth, and gums to look for any oral health problems, such as: °¨ Cavities (dental caries). This is   decay on the tooth caused by plaque, sugar, and acid in the mouth. It is best to catch a cavity when it is small. °¨ Inflammation of the gums caused by plaque buildup (gingivitis). °¨ Problems with the mouth or malformed or misaligned teeth. °¨ Oral cancer or other diseases of the soft tissues or jaws.  °KEEP YOUR TEETH AND GUMS  HEALTHY °For healthy teeth and gums, follow these general guidelines as well as your dentist's specific advice: °· Have your teeth professionally cleaned at the dentist every 6 months. °· Brush twice daily with a fluoride toothpaste. °· Floss your teeth daily.  °· Ask your dentist if you need fluoride supplements, treatments, or fluoride toothpaste. °· Eat a healthy diet. Reduce foods and drinks with added sugar. °· Avoid smoking. °TREATMENT FOR ORAL HEALTH PROBLEMS °If you have oral health problems, treatment varies depending on the conditions present in your teeth and gums. °· Your caregiver will most likely recommend good oral hygiene at each visit. °· For cavities, gingivitis, or other oral health disease, your caregiver will perform a procedure to treat the problem. This is typically done at a separate appointment. Sometimes your caregiver will refer you to another dental specialist for specific tooth problems or for surgery. °SEEK IMMEDIATE DENTAL CARE IF: °· You have pain, bleeding, or soreness in the gum, tooth, jaw, or mouth area. °· A permanent tooth becomes loose or separated from the gum socket. °· You experience a blow or injury to the mouth or jaw area. °Document Released: 06/06/2011 Document Revised: 12/17/2011 Document Reviewed: 06/06/2011 °ExitCare® Patient Information ©2015 [employer]. This information is not intended to replace advice given to you by your health care provider. Make sure you discuss any questions you have with your health care provider. ° °

## 2015-07-12 ENCOUNTER — Other Ambulatory Visit: Payer: 59

## 2015-07-12 DIAGNOSIS — B182 Chronic viral hepatitis C: Secondary | ICD-10-CM

## 2015-07-14 LAB — HEPATITIS C RNA QUANTITATIVE: HCV Quantitative: NOT DETECTED IU/mL (ref <15)

## 2015-07-21 ENCOUNTER — Encounter: Payer: Self-pay | Admitting: Internal Medicine

## 2015-07-21 ENCOUNTER — Ambulatory Visit (INDEPENDENT_AMBULATORY_CARE_PROVIDER_SITE_OTHER): Payer: 59 | Admitting: Internal Medicine

## 2015-07-21 VITALS — BP 117/75 | HR 56 | Temp 98.1°F | Ht 71.5 in | Wt 166.0 lb

## 2015-07-21 DIAGNOSIS — Z23 Encounter for immunization: Secondary | ICD-10-CM | POA: Diagnosis not present

## 2015-07-21 DIAGNOSIS — B182 Chronic viral hepatitis C: Secondary | ICD-10-CM

## 2015-07-21 NOTE — Progress Notes (Signed)
   Subjective:    Patient ID: Nicholas [name], male    DOB: May 27, 1956, 59 y.o.   MRN: [id number]  HPI Here for follow up of HCV.  Genotype 1a, F0/1 on elastography, early viral load undetectable and again at end of treatment.  Did have fatigue with Harvoni and better now off of it.    Review of Systems  Constitutional: Negative for fatigue.  Gastrointestinal: Negative for nausea and diarrhea.  Skin: Negative for rash.  Neurological: Negative for dizziness, light-headedness and headaches.       Objective:   Physical Exam  Constitutional: He appears well-developed and well-nourished. No distress.  HENT:  Mouth/Throat: No oropharyngeal exudate.  Eyes: No scleral icterus.  Cardiovascular: Normal rate, regular rhythm and normal heart sounds.   No murmur heard. Pulmonary/Chest: Effort normal and breath sounds normal. No respiratory distress. He has no wheezes.  Lymphadenopathy:    He has no cervical adenopathy.  Skin: No rash noted.          Assessment & Plan:

## 2015-07-21 NOTE — Assessment & Plan Note (Signed)
Did great with treatment  Will recheck in 4-5 months for SVR.

## 2015-11-01 ENCOUNTER — Other Ambulatory Visit: Payer: Self-pay | Admitting: Cardiology

## 2015-11-01 MED FILL — HYDROCHLOROTHIAZIDE 25 MG T: 25 | 30 days supply | Qty: 45 | Fill #0

## 2015-11-15 MED FILL — ALFUZOSIN HCL ER 10 MG TAB: 10 | 30 days supply | Qty: 30 | Fill #0

## 2015-12-05 MED FILL — SIMVASTATIN 40 MG TABLET: 40 | 90 days supply | Qty: 90 | Fill #2

## 2015-12-14 MED FILL — ALFUZOSIN HCL ER 10 MG TAB: 10 | 90 days supply | Qty: 90 | Fill #1

## 2015-12-19 ENCOUNTER — Encounter: Payer: Self-pay | Admitting: Internal Medicine

## 2015-12-19 ENCOUNTER — Ambulatory Visit (INDEPENDENT_AMBULATORY_CARE_PROVIDER_SITE_OTHER): Payer: 59 | Admitting: Internal Medicine

## 2015-12-19 VITALS — BP 121/80 | HR 62 | Temp 98.0°F | Ht 71.0 in | Wt 175.0 lb

## 2015-12-19 DIAGNOSIS — B182 Chronic viral hepatitis C: Secondary | ICD-10-CM | POA: Diagnosis not present

## 2015-12-19 DIAGNOSIS — K7400 Hepatic fibrosis, unspecified: Secondary | ICD-10-CM

## 2015-12-19 NOTE — Assessment & Plan Note (Signed)
Minimal fibrosis, no follow up indicated.

## 2015-12-19 NOTE — Progress Notes (Signed)
   Subjective:    Patient ID: Nicholas [name], male    DOB: 09-10-1956, 60 y.o.   MRN: [id number]  HPI Here for follow up of HCV.   Genotype 1a, F0/1 on elastography, early viral load undetectable and again at end of treatment.  Did have fatigue with Harvoni.  Here for SVR12 (over 4 months after finishing)   Review of Systems  Constitutional: Negative for fatigue.  Gastrointestinal: Negative for nausea and diarrhea.  Skin: Negative for rash.  Neurological: Negative for dizziness, light-headedness and headaches.       Objective:   Physical Exam  Constitutional: He appears well-developed and well-nourished. No distress.  HENT:  Mouth/Throat: No oropharyngeal exudate.  Eyes: No scleral icterus.  Lymphadenopathy:    He has no cervical adenopathy.  Skin: No rash noted.          Assessment & Plan:

## 2015-12-19 NOTE — Assessment & Plan Note (Signed)
SVR12 today and if negative, is considered cured.  Follow up PRN.  Still needs hepatitis A #2 but not available at this time.  Will defer if possible to his PCP to do the second one.

## 2015-12-20 DIAGNOSIS — B182 Chronic viral hepatitis C: Secondary | ICD-10-CM | POA: Diagnosis not present

## 2015-12-21 LAB — HEPATITIS C RNA QUANTITATIVE: HCV QUANT: NOT DETECTED [IU]/mL (ref <15)

## 2015-12-22 ENCOUNTER — Telehealth: Payer: Self-pay | Admitting: *Deleted

## 2015-12-22 NOTE — Telephone Encounter (Signed)
-----   Message from Thayer Headings, MD sent at 12/21/2015 11:53 AM EDT ----- Please let him know his final HCV viral load has remained undetectable and he is now considered cured.  No follow up needed. thanks

## 2015-12-22 NOTE — Telephone Encounter (Signed)
Left the patient a message.  Per Dr. Linus Salmons pt HEP C VL is undetectable and he is cured, no follow-up necessary.

## 2016-02-07 ENCOUNTER — Other Ambulatory Visit: Payer: Self-pay

## 2016-02-07 ENCOUNTER — Other Ambulatory Visit: Payer: Self-pay | Admitting: Cardiology

## 2016-02-07 MED ORDER — HYDROCHLOROTHIAZIDE 25 MG PO TABS: ORAL_TABLET | ORAL | Status: DC

## 2016-02-07 MED FILL — HYDROCHLOROTHIAZIDE 25 MG T: 25 | 30 days supply | Qty: 15 | Fill #0

## 2016-03-02 DIAGNOSIS — I10 Essential (primary) hypertension: Secondary | ICD-10-CM | POA: Diagnosis not present

## 2016-03-02 DIAGNOSIS — E7800 Pure hypercholesterolemia, unspecified: Secondary | ICD-10-CM | POA: Diagnosis not present

## 2016-03-02 DIAGNOSIS — N400 Enlarged prostate without lower urinary tract symptoms: Secondary | ICD-10-CM | POA: Diagnosis not present

## 2016-03-02 DIAGNOSIS — M25569 Pain in unspecified knee: Secondary | ICD-10-CM | POA: Diagnosis not present

## 2016-03-02 DIAGNOSIS — Z125 Encounter for screening for malignant neoplasm of prostate: Secondary | ICD-10-CM | POA: Diagnosis not present

## 2016-03-12 ENCOUNTER — Other Ambulatory Visit: Payer: Self-pay | Admitting: Cardiology

## 2016-03-13 MED FILL — SIMVASTATIN 40 MG TABLET: 40 | 30 days supply | Qty: 30 | Fill #0

## 2016-03-13 MED FILL — HYDROCHLOROTHIAZIDE 25 MG T: 25 | 30 days supply | Qty: 15 | Fill #0

## 2016-03-14 MED FILL — ALFUZOSIN HCL ER 10 MG TAB: 10 | 90 days supply | Qty: 90 | Fill #2

## 2016-04-09 ENCOUNTER — Other Ambulatory Visit: Payer: Self-pay | Admitting: Cardiology

## 2016-04-09 MED FILL — HYDROCHLOROTHIAZIDE 25 MG T: 25 | 30 days supply | Qty: 15 | Fill #0

## 2016-04-09 MED FILL — SIMVASTATIN 40 MG TABLET: 40 | 30 days supply | Qty: 30 | Fill #0

## 2016-04-18 ENCOUNTER — Encounter: Payer: Self-pay | Admitting: Cardiology

## 2016-05-02 ENCOUNTER — Encounter: Payer: Self-pay | Admitting: *Deleted

## 2016-05-14 ENCOUNTER — Other Ambulatory Visit: Payer: Self-pay | Admitting: *Deleted

## 2016-05-14 ENCOUNTER — Other Ambulatory Visit: Payer: Self-pay | Admitting: Cardiology

## 2016-05-14 MED ORDER — SIMVASTATIN 40 MG PO TABS: 40.0000 mg | ORAL_TABLET | Freq: Every evening | ORAL | 0 refills | Status: DC

## 2016-05-14 MED ORDER — HYDROCHLOROTHIAZIDE 25 MG PO TABS: 12.5000 mg | ORAL_TABLET | Freq: Every day | ORAL | 0 refills | Status: DC

## 2016-05-14 MED FILL — SIMVASTATIN 40 MG TABLET: 40 | 15 days supply | Qty: 15 | Fill #0

## 2016-05-14 MED FILL — HYDROCHLOROTHIAZIDE 25 MG T: 25 | 30 days supply | Qty: 15 | Fill #0

## 2016-05-16 ENCOUNTER — Ambulatory Visit: Payer: 59 | Admitting: Cardiology

## 2016-05-21 NOTE — Progress Notes (Signed)
[facility], [place]  [address] Phone: [phone] Fax:  [phone]  Date:  05/22/2016   ID:  Nicholas [name], DOB [birth year], MRN [id number]  PCP:  Donnie Coffin, MD   History of Present Illness: Nicholas [name] is a 60 y.o. male with coronary artery disease status post bypass surgery in 2003, hypertension, hyperlipidemia, presyncope with recent hospitalization on 06/12/14, low risk exercise treadmill test.  Last hospitalization was due to presyncope, his blood pressure was quite low. His presyncope occurred daily especially when he leaned forward or get emotionally worked up. Could not mow the lawn because of being tired out. He was noted to have frequent ectopy/PVCs on telemetry and occasional bradycardia.  Nuclear stress test on 06/11/14 demonstrated inferior scar, large with no inducible ischemia with ejection fraction of 46%.  Echocardiogram on 06/11/14 showed EF of 50-55%.there was akinesis of the basal inferior myocardium.  Edema was noted in hands previously, hydrochlorothiazide was recently started again at 12.5 mg. His blood pressures however remained quite low in the 90s at times, low 100s. I decided to use HCTZ only on as-needed basis because of this however he now takes daily and is doing well. He is no longer on enalapril, metoprolol.   Wt Readings from Last 3 Encounters:  05/22/16 169 lb (76.7 kg)  12/19/15 175 lb (79.4 kg)  07/21/15 166 lb (75.3 kg)     Past Medical History:  Diagnosis Date  . Coronary artery disease   . Dysrhythmia 06/2014   sinus brady with PVC'S  . Hepatitis C   . Hx of adenomatous colonic polyps 09/21/2014  . Hypercholesteremia   . Hypertension   . Myocardial infarction [facility])     Past Surgical History:  Procedure Laterality Date  . CARDIAC SURGERY     triple bypass  . CORONARY ARTERY BYPASS GRAFT     CABG x 3 Vessels    Current Outpatient Prescriptions  Medication Sig Dispense Refill  . alfuzosin  (UROXATRAL) 10 MG 24 hr tablet Take 10 mg by mouth every evening.    Marland Kitchen aspirin EC 81 MG tablet Take 81 mg by mouth daily with breakfast.    . hydrochlorothiazide (HYDRODIURIL) 25 MG tablet Take 0.5 tablets (12.5 mg total) by mouth daily. 45 tablet 3  . ibuprofen (ADVIL,MOTRIN) 600 MG tablet Take 1 tablet (600 mg total) by mouth every 6 (six) hours as needed. 30 tablet 0  . nitroGLYCERIN (NITROSTAT) 0.4 MG SL tablet Place 1 tablet (0.4 mg total) under the tongue every 5 (five) minutes x 3 doses as needed for chest pain. 25 tablet 12  . simvastatin (ZOCOR) 40 MG tablet Take 1 tablet (40 mg total) by mouth every evening. 90 tablet 3   No current facility-administered medications for this visit.     Allergies:   No Known Allergies  Social History:  The patient  reports that he quit smoking about 13 years ago. He has never used smokeless tobacco. He reports that he drinks alcohol. He reports that he uses drugs, including Marijuana.   Family History  Problem Relation Age of Onset  . Diabetic kidney disease Mother   . Kidney disease Mother   . Throat cancer Mother   . Cancer Mother   . Diabetes Mother   . Hypertension Mother   . Other Father     unknown  . Hypertension Father   . Heart attack Neg Hx   . Colon cancer Neg Hx   .  Esophageal cancer Neg Hx   . Stomach cancer Neg Hx   . Rectal cancer Neg Hx     ROS:  Please see the history of present illness.   No further syncope/presyncope, no dizziness, no orthopnea. Edema has improved. No CP.   All other systems reviewed and negative.   PHYSICAL EXAM: VS:  BP 116/74   Pulse 62   Ht 5\' 11"  (1.803 m)   Wt 169 lb (76.7 kg)   BMI 23.57 kg/m  Well nourished, well developed, in no acute distress  HEENT: normal, [place]/AT, EOMI Neck: no JVD, normal carotid upstroke, no bruit Cardiac:  normal S1, S2; RRR; no murmur  Lungs:  clear to auscultation bilaterally, no wheezing, rhonchi or rales  Abd: soft, nontender, no hepatomegaly, no bruits    Ext: no edema, 2+ distal pulses, Left knee scar-prior jar of tea broke Skin: warm and dry  GU: deferred Neuro: no focal abnormalities noted, AAO x 3  EKG:  EKG was ordered today-05/22/16-sinus bradycardia rate 58, T wave inversion inferior leads as well as lateral leads personally reviewed-no change.   ASSESSMENT AND PLAN:  1. Coronary artery disease status post bypass-doing well. Stable, no angina. Inferior scar noted on nuclear stress test. Reassuring. 2. Presyncope-no further issues, could have been precipitated by relative hypotension. Both his enalapril and HCTZ were held during hospitalization. His HCTZ was restarted at 12.5 mg after noteworthy hand edema was noted. Doing well. 3. Bradycardia-could've been relative bradycardia at times secondary to PVCs. I will avoid metoprolol. He has tried this in the past and has not tolerated it. On exam, no PVC's. 4. PVCs-no PVCs noted today.  PVCs noted on telemetry in the hospital. Doing well. 5. HCV - cured, viral load undetectable. If left knee is bothering him again, consult Dr. Alroy Dust. 6. 12 month follow up.  Signed, Candee Furbish, MD [facility]  05/22/2016 8:24 AM

## 2016-05-22 ENCOUNTER — Encounter (INDEPENDENT_AMBULATORY_CARE_PROVIDER_SITE_OTHER): Payer: Self-pay

## 2016-05-22 ENCOUNTER — Ambulatory Visit (INDEPENDENT_AMBULATORY_CARE_PROVIDER_SITE_OTHER): Payer: 59 | Admitting: Cardiology

## 2016-05-22 ENCOUNTER — Encounter: Payer: Self-pay | Admitting: Cardiology

## 2016-05-22 VITALS — BP 116/74 | HR 62 | Ht 71.0 in | Wt 169.0 lb

## 2016-05-22 DIAGNOSIS — E785 Hyperlipidemia, unspecified: Secondary | ICD-10-CM

## 2016-05-22 DIAGNOSIS — B182 Chronic viral hepatitis C: Secondary | ICD-10-CM

## 2016-05-22 DIAGNOSIS — I2510 Atherosclerotic heart disease of native coronary artery without angina pectoris: Secondary | ICD-10-CM | POA: Diagnosis not present

## 2016-05-22 DIAGNOSIS — I2583 Coronary atherosclerosis due to lipid rich plaque: Secondary | ICD-10-CM

## 2016-05-22 MED ORDER — SIMVASTATIN 40 MG PO TABS: 40.0000 mg | ORAL_TABLET | Freq: Every evening | ORAL | 3 refills | Status: DC

## 2016-05-22 MED ORDER — NITROGLYCERIN 0.4 MG SL SUBL: 0.4000 mg | SUBLINGUAL_TABLET | SUBLINGUAL | 12 refills | Status: DC | PRN

## 2016-05-22 MED ORDER — HYDROCHLOROTHIAZIDE 25 MG PO TABS: 12.5000 mg | ORAL_TABLET | Freq: Every day | ORAL | 3 refills | Status: DC

## 2016-05-22 MED FILL — NITROGLYCERIN 0.4 MG TAB SL: 0.4 | 5 days supply | Qty: 25 | Fill #0

## 2016-05-22 NOTE — Patient Instructions (Signed)
Medication Instructions:  Your physician recommends that you continue on your current medications as directed. Please refer to the Current Medication list given to you today.   Labwork: NONE  Testing/Procedures: NONE  Follow-Up: Your physician wants you to follow-up in: [place] will receive a reminder letter in the mail two months in advance. If you don't receive a letter, please call our office to schedule the follow-up appointment.   Any Other Special Instructions Will Be Listed Below (If Applicable).     If you need a refill on your cardiac medications before your next appointment, please call your pharmacy.

## 2016-06-04 MED FILL — SIMVASTATIN 40 MG TABLET: 40 | 90 days supply | Qty: 90 | Fill #0

## 2016-06-04 MED FILL — ALFUZOSIN HCL ER 10 MG TAB: 10 | 90 days supply | Qty: 90 | Fill #3

## 2016-06-04 MED FILL — HYDROCHLOROTHIAZIDE 25 MG T: 25 | 90 days supply | Qty: 45 | Fill #0

## 2016-09-11 MED FILL — SIMVASTATIN 40 MG TABLET: 40 | 90 days supply | Qty: 90 | Fill #1

## 2016-09-11 MED FILL — ALFUZOSIN HCL ER 10 MG TAB: 10 | 90 days supply | Qty: 90 | Fill #4

## 2016-09-19 MED FILL — HYDROCHLOROTHIAZIDE 25 MG T: 25 | 90 days supply | Qty: 45 | Fill #1

## 2016-09-21 ENCOUNTER — Encounter: Payer: Self-pay | Admitting: Cardiology

## 2016-09-21 DIAGNOSIS — Z23 Encounter for immunization: Secondary | ICD-10-CM | POA: Diagnosis not present

## 2016-09-21 DIAGNOSIS — I2510 Atherosclerotic heart disease of native coronary artery without angina pectoris: Secondary | ICD-10-CM | POA: Diagnosis not present

## 2016-09-21 DIAGNOSIS — N400 Enlarged prostate without lower urinary tract symptoms: Secondary | ICD-10-CM | POA: Diagnosis not present

## 2016-09-21 DIAGNOSIS — E7800 Pure hypercholesterolemia, unspecified: Secondary | ICD-10-CM | POA: Diagnosis not present

## 2016-09-21 DIAGNOSIS — I10 Essential (primary) hypertension: Secondary | ICD-10-CM | POA: Diagnosis not present

## 2016-11-01 DIAGNOSIS — H524 Presbyopia: Secondary | ICD-10-CM | POA: Diagnosis not present

## 2016-11-01 DIAGNOSIS — H5203 Hypermetropia, bilateral: Secondary | ICD-10-CM | POA: Diagnosis not present

## 2016-12-17 MED FILL — SIMVASTATIN 40 MG TABLET: 40 | 90 days supply | Qty: 90 | Fill #2

## 2016-12-19 MED FILL — ALFUZOSIN HCL ER 10 MG TAB: 10 | 90 days supply | Qty: 90 | Fill #0

## 2016-12-27 MED FILL — HYDROCHLOROTHIAZIDE 25 MG T: 25 | 90 days supply | Qty: 45 | Fill #2

## 2017-03-13 ENCOUNTER — Other Ambulatory Visit: Payer: Self-pay | Admitting: Family Medicine

## 2017-03-13 ENCOUNTER — Ambulatory Visit
Admission: RE | Admit: 2017-03-13 | Discharge: 2017-03-13 | Disposition: A | Discharge status: Home / Self Care | Payer: 59 | Source: Ambulatory Visit | Attending: Family Medicine | Admitting: Family Medicine

## 2017-03-13 DIAGNOSIS — M79672 Pain in left foot: Secondary | ICD-10-CM

## 2017-03-13 DIAGNOSIS — R0789 Other chest pain: Secondary | ICD-10-CM | POA: Diagnosis not present

## 2017-03-13 DIAGNOSIS — K219 Gastro-esophageal reflux disease without esophagitis: Secondary | ICD-10-CM | POA: Diagnosis not present

## 2017-03-13 MED FILL — PANTOPRAZOLE SOD DR 40 MG T: 40 | 30 days supply | Qty: 30 | Fill #0

## 2017-03-13 MED FILL — DICLOFENAC SOD 75 MG TAB EC: 75 | 30 days supply | Qty: 60 | Fill #0

## 2017-03-19 MED FILL — SIMVASTATIN 40 MG TABLET: 40 | 90 days supply | Qty: 90 | Fill #3

## 2017-03-19 MED FILL — ALFUZOSIN HCL ER 10 MG TAB: 10 | 90 days supply | Qty: 90 | Fill #1

## 2017-03-25 MED FILL — HYDROCHLOROTHIAZIDE 25 MG T: 25 | 90 days supply | Qty: 45 | Fill #3

## 2017-04-15 MED FILL — PANTOPRAZOLE SOD DR 40 MG T: 40 | 30 days supply | Qty: 30 | Fill #1

## 2017-04-15 MED FILL — DICLOFENAC SOD 75 MG TAB EC: 75 | 30 days supply | Qty: 60 | Fill #0

## 2017-05-17 MED FILL — DICLOFENAC SOD 75 MG TAB EC: 75 | 30 days supply | Qty: 60 | Fill #1

## 2017-05-17 MED FILL — PANTOPRAZOLE SOD DR 40 MG T: 40 | 90 days supply | Qty: 90 | Fill #2

## 2017-06-17 MED FILL — DICLOFENAC SOD 75 MG TAB EC: 75 | 15 days supply | Qty: 30 | Fill #0

## 2017-06-17 MED FILL — ALFUZOSIN HCL ER 10 MG TAB: 10 | 30 days supply | Qty: 30 | Fill #2

## 2017-07-01 ENCOUNTER — Other Ambulatory Visit: Payer: Self-pay | Admitting: Cardiology

## 2017-07-01 DIAGNOSIS — M2550 Pain in unspecified joint: Secondary | ICD-10-CM | POA: Diagnosis not present

## 2017-07-01 DIAGNOSIS — N400 Enlarged prostate without lower urinary tract symptoms: Secondary | ICD-10-CM | POA: Diagnosis not present

## 2017-07-01 DIAGNOSIS — I2510 Atherosclerotic heart disease of native coronary artery without angina pectoris: Secondary | ICD-10-CM | POA: Diagnosis not present

## 2017-07-01 DIAGNOSIS — Z23 Encounter for immunization: Secondary | ICD-10-CM | POA: Diagnosis not present

## 2017-07-01 DIAGNOSIS — I10 Essential (primary) hypertension: Secondary | ICD-10-CM | POA: Diagnosis not present

## 2017-07-01 DIAGNOSIS — Z125 Encounter for screening for malignant neoplasm of prostate: Secondary | ICD-10-CM | POA: Diagnosis not present

## 2017-07-01 DIAGNOSIS — K219 Gastro-esophageal reflux disease without esophagitis: Secondary | ICD-10-CM | POA: Diagnosis not present

## 2017-07-01 DIAGNOSIS — E7800 Pure hypercholesterolemia, unspecified: Secondary | ICD-10-CM | POA: Diagnosis not present

## 2017-07-01 MED FILL — DICLOFENAC SOD 75 MG TAB EC: 75 | 30 days supply | Qty: 60 | Fill #0

## 2017-07-01 MED FILL — SIMVASTATIN 40 MG TABLET: 40 | 90 days supply | Qty: 90 | Fill #0

## 2017-07-01 MED FILL — HYDROCHLOROTHIAZIDE 25 MG T: 25 | 90 days supply | Qty: 45 | Fill #0

## 2017-07-15 MED FILL — ALFUZOSIN HCL ER 10 MG TAB: 10 | 90 days supply | Qty: 90 | Fill #3

## 2017-08-05 MED FILL — DICLOFENAC SOD 75 MG TAB EC: 75 | 30 days supply | Qty: 60 | Fill #1

## 2017-09-04 MED FILL — DICLOFENAC SODIUM 75 MG TAB: 75 | 30 days supply | Qty: 60 | Fill #2

## 2017-09-23 MED FILL — SIMVASTATIN 40 MG TABLET: 40 | 90 days supply | Qty: 90 | Fill #1

## 2017-09-23 MED FILL — HYDROCHLOROTHIAZIDE 25 MG T: 25 | 90 days supply | Qty: 45 | Fill #1

## 2017-10-10 MED FILL — DICLOFENAC SODIUM 75 MG TAB: 75 | 30 days supply | Qty: 60 | Fill #3

## 2017-10-10 MED FILL — ALFUZOSIN HCL ER 10 MG TAB: 10 | 60 days supply | Qty: 60 | Fill #4

## 2017-10-23 ENCOUNTER — Encounter: Payer: Self-pay | Admitting: Internal Medicine

## 2017-11-15 DIAGNOSIS — H524 Presbyopia: Secondary | ICD-10-CM | POA: Diagnosis not present

## 2017-11-15 DIAGNOSIS — H5203 Hypermetropia, bilateral: Secondary | ICD-10-CM | POA: Diagnosis not present

## 2017-11-15 DIAGNOSIS — H2513 Age-related nuclear cataract, bilateral: Secondary | ICD-10-CM | POA: Diagnosis not present

## 2017-11-19 DIAGNOSIS — N400 Enlarged prostate without lower urinary tract symptoms: Secondary | ICD-10-CM | POA: Diagnosis not present

## 2017-11-19 DIAGNOSIS — I10 Essential (primary) hypertension: Secondary | ICD-10-CM | POA: Diagnosis not present

## 2017-11-19 DIAGNOSIS — E7800 Pure hypercholesterolemia, unspecified: Secondary | ICD-10-CM | POA: Diagnosis not present

## 2017-11-19 DIAGNOSIS — M2550 Pain in unspecified joint: Secondary | ICD-10-CM | POA: Diagnosis not present

## 2017-11-19 DIAGNOSIS — K219 Gastro-esophageal reflux disease without esophagitis: Secondary | ICD-10-CM | POA: Diagnosis not present

## 2017-11-19 DIAGNOSIS — M2141 Flat foot [pes planus] (acquired), right foot: Secondary | ICD-10-CM | POA: Diagnosis not present

## 2017-11-19 DIAGNOSIS — I2510 Atherosclerotic heart disease of native coronary artery without angina pectoris: Secondary | ICD-10-CM | POA: Diagnosis not present

## 2017-12-03 ENCOUNTER — Encounter: Payer: Self-pay | Admitting: Internal Medicine

## 2017-12-06 MED FILL — ALFUZOSIN HCL ER 10 MG TAB: 10 | 90 days supply | Qty: 90 | Fill #0

## 2017-12-17 MED FILL — HYDROCHLOROTHIAZIDE 25 MG T: 25 | 90 days supply | Qty: 45 | Fill #2

## 2018-01-03 MED FILL — SIMVASTATIN 40 MG TABLET: 40 | 90 days supply | Qty: 90 | Fill #2

## 2018-02-07 ENCOUNTER — Other Ambulatory Visit: Payer: Self-pay

## 2018-02-07 ENCOUNTER — Ambulatory Visit (AMBULATORY_SURGERY_CENTER): Payer: Self-pay

## 2018-02-07 VITALS — Ht 71.0 in | Wt 167.8 lb

## 2018-02-07 DIAGNOSIS — Z86010 Personal history of colonic polyps: Secondary | ICD-10-CM

## 2018-02-07 NOTE — Progress Notes (Signed)
No egg or soy allergy known to patient  No issues with past sedation with any surgeries  or procedures, no intubation problems  No diet pills per patient No home 02 use per patient  No blood thinners per patient  Pt denies issues with constipation  No A fib or A flutter  EMMI video sent to pt's e mail , pt declined    

## 2018-02-11 ENCOUNTER — Encounter: Payer: Self-pay | Admitting: Internal Medicine

## 2018-02-18 ENCOUNTER — Encounter: Payer: Self-pay | Admitting: *Deleted

## 2018-02-21 ENCOUNTER — Encounter: Payer: Self-pay | Admitting: Internal Medicine

## 2018-02-21 ENCOUNTER — Other Ambulatory Visit: Payer: Self-pay

## 2018-02-21 ENCOUNTER — Ambulatory Visit (AMBULATORY_SURGERY_CENTER): Payer: 59 | Admitting: Internal Medicine

## 2018-02-21 VITALS — BP 140/88 | HR 51 | Temp 98.0°F | Resp 20 | Ht 71.0 in | Wt 167.0 lb

## 2018-02-21 DIAGNOSIS — Z860100 Personal history of colon polyps, unspecified: Secondary | ICD-10-CM

## 2018-02-21 DIAGNOSIS — I2510 Atherosclerotic heart disease of native coronary artery without angina pectoris: Secondary | ICD-10-CM | POA: Diagnosis not present

## 2018-02-21 DIAGNOSIS — K635 Polyp of colon: Secondary | ICD-10-CM

## 2018-02-21 DIAGNOSIS — N189 Chronic kidney disease, unspecified: Secondary | ICD-10-CM | POA: Diagnosis not present

## 2018-02-21 DIAGNOSIS — Z1211 Encounter for screening for malignant neoplasm of colon: Secondary | ICD-10-CM | POA: Diagnosis not present

## 2018-02-21 DIAGNOSIS — D125 Benign neoplasm of sigmoid colon: Secondary | ICD-10-CM

## 2018-02-21 MED ORDER — SODIUM CHLORIDE 0.9 % IV SOLN: 500.0000 mL | Freq: Once | INTRAVENOUS | Status: DC

## 2018-02-21 NOTE — Progress Notes (Signed)
Pt's states no medical or surgical changes since previsit or office visit. 

## 2018-02-21 NOTE — Patient Instructions (Addendum)
I found and removed one tiny polyp - looks benign. I will let you know pathology results and when to have another routine colonoscopy by mail and/or My Chart. Should be 5 years.  I appreciate the opportunity to care for you. Gatha Mayer, MD, FACG  YOU HAD AN ENDOSCOPIC PROCEDURE TODAY AT [facility]:   Refer to the procedure report that was given to you for any specific questions about what was found during the examination.  If the procedure report does not answer your questions, please call your gastroenterologist to clarify.  If you requested that your care partner not be given the details of your procedure findings, then the procedure report has been included in a sealed envelope for you to review at your convenience later.  YOU SHOULD EXPECT: Some feelings of bloating in the abdomen. Passage of more gas than usual.  Walking can help get rid of the air that was put into your GI tract during the procedure and reduce the bloating. If you had a lower endoscopy (such as a colonoscopy or flexible sigmoidoscopy) you may notice spotting of blood in your stool or on the toilet paper. If you underwent a bowel prep for your procedure, you may not have a normal bowel movement for a few days.  Please Note:  You might notice some irritation and congestion in your nose or some drainage.  This is from the oxygen used during your procedure.  There is no need for concern and it should clear up in a day or so.  SYMPTOMS TO REPORT IMMEDIATELY:   Following lower endoscopy (colonoscopy or flexible sigmoidoscopy):  Excessive amounts of blood in the stool  Significant tenderness or worsening of abdominal pains  Swelling of the abdomen that is new, acute  Fever of 100F or higher   For urgent or emergent issues, a gastroenterologist can be reached at any hour by calling [phone].   DIET:  We do recommend a small meal at first, but then you may proceed to your regular diet.   Drink plenty of fluids but you should avoid alcoholic beverages for 24 hours.  MEDICATIONS: Continue present medications.  Please see handouts given to you by your recovery nurse.  ACTIVITY:  You should plan to take it easy for the rest of today and you should NOT DRIVE or use heavy machinery until tomorrow (because of the sedation medicines used during the test).    FOLLOW UP: Our staff will call the number listed on your records the next business day following your procedure to check on you and address any questions or concerns that you may have regarding the information given to you following your procedure. If we do not reach you, we will leave a message.  However, if you are feeling well and you are not experiencing any problems, there is no need to return our call.  We will assume that you have returned to your regular daily activities without incident.  If any biopsies were taken you will be contacted by phone or by letter within the next 1-3 weeks.  Please call us at [phone] if you have not heard about the biopsies in 3 weeks.   Thank you for allowing [place] to provide for your healthcare needs today.  SIGNATURES/CONFIDENTIALITY: You and/or your care partner have signed paperwork which will be entered into your electronic medical record.  These signatures attest to the fact that that the information above on your After Visit Summary has  been reviewed and is understood.  Full responsibility of the confidentiality of this discharge information lies with you and/or your care-partner.

## 2018-02-21 NOTE — Progress Notes (Signed)
Called to room to assist during endoscopic procedure.  Patient ID and intended procedure confirmed with present staff. Received instructions for my participation in the procedure from the performing physician.  

## 2018-02-21 NOTE — Op Note (Signed)
[place] Patient Name: Nicholas [name] Procedure Date: 02/21/2018 11:22 AM MRN: [id number] Endoscopist: Gatha Mayer , MD Age: 62 Referring MD:  Date of Birth: 1956-09-22 Gender: Male Account #: [id number] Procedure:                Colonoscopy Indications:              Surveillance: Personal history of adenomatous                            polyps on last colonoscopy > 3 years ago Medicines:                Propofol per Anesthesia, Monitored Anesthesia Care Procedure:                Pre-Anesthesia Assessment:                           - Prior to the procedure, a History and Physical                            was performed, and patient medications and                            allergies were reviewed. The patient's tolerance of                            previous anesthesia was also reviewed. The risks                            and benefits of the procedure and the sedation                            options and risks were discussed with the patient.                            All questions were answered, and informed consent                            was obtained. Prior Anticoagulants: The patient has                            taken no previous anticoagulant or antiplatelet                            agents. ASA Grade Assessment: II - A patient with                            mild systemic disease. After reviewing the risks                            and benefits, the patient was deemed in                            satisfactory condition to undergo the procedure.  After obtaining informed consent, the colonoscope                            was passed under direct vision. Throughout the                            procedure, the patient's blood pressure, pulse, and                            oxygen saturations were monitored continuously. The                            Colonoscope was introduced through the anus and   advanced to the the cecum, identified by                            appendiceal orifice and ileocecal valve. The                            colonoscopy was performed without difficulty. The                            patient tolerated the procedure well. The quality                            of the bowel preparation was good. The ileocecal                            valve, appendiceal orifice, and rectum were                            photographed. The bowel preparation used was                            Miralax. Scope In: 11:28:13 AM Scope Out: 11:44:49 AM Scope Withdrawal Time: 0 hours 12 minutes 38 seconds  Total Procedure Duration: 0 hours 16 minutes 36 seconds  Findings:                 The perianal and digital rectal examinations were                            normal. Pertinent negatives include normal prostate                            (size, shape, and consistency).                           A diminutive polyp was found in the sigmoid colon.                            The polyp was sessile. The polyp was removed with a                            hot snare. The polyp was removed  with a cold snare.                            Resection and retrieval were complete. Verification                            of patient identification for the specimen was                            done. Estimated blood loss was minimal.                           Multiple diverticula were found in the sigmoid                            colon.                           The exam was otherwise without abnormality on                            direct and retroflexion views. Complications:            No immediate complications. Estimated Blood Loss:     Estimated blood loss was minimal. Impression:               - One diminutive polyp in the sigmoid colon,                            removed with a hot snare and removed with a cold                            snare. Resected and retrieved.                            - Diverticulosis in the sigmoid colon.                           - The examination was otherwise normal on direct                            and retroflexion views.                           - Personal history of colonic polyps. 3 adenomas                            2015 Recommendation:           - Patient has a contact number available for                            emergencies. The signs and symptoms of potential                            delayed complications were discussed with the  patient. Return to normal activities tomorrow.                            Written discharge instructions were provided to the                            patient.                           - Resume previous diet.                           - Continue present medications.                           - Repeat colonoscopy is recommended for                            surveillance. The colonoscopy date will be                            determined after pathology results from today's                            exam become available for review. Gatha Mayer, MD 02/21/2018 11:57:02 AM This report has been signed electronically.

## 2018-02-21 NOTE — Progress Notes (Signed)
Report to RN, VSS, adequate respirations noted, no c/o pain or discomfort 

## 2018-02-24 ENCOUNTER — Telehealth: Payer: Self-pay

## 2018-02-24 NOTE — Telephone Encounter (Signed)
Attempted to reach patient for post-procedure f/u call (2nd attempt). No answer. Left message for him to please not hesitate to call us if he has any questions/concerns regarding his care.

## 2018-02-24 NOTE — Telephone Encounter (Signed)
NO ANSWER, MESSAGE LEFT FOR PATIENT. 

## 2018-02-28 ENCOUNTER — Encounter: Payer: Self-pay | Admitting: Internal Medicine

## 2018-02-28 DIAGNOSIS — Z86010 Personal history of colonic polyps: Secondary | ICD-10-CM

## 2018-02-28 NOTE — Progress Notes (Signed)
Hyperplastic distal polyp Recall 2024 (3 adenomas 2015)

## 2018-03-10 MED FILL — ALFUZOSIN HCL ER 10 MG TAB: 10 | 90 days supply | Qty: 90 | Fill #1

## 2018-03-19 MED FILL — HYDROCHLOROTHIAZIDE 25 MG T: 25 | 90 days supply | Qty: 45 | Fill #3

## 2018-04-14 MED FILL — SIMVASTATIN 40 MG TABLET: 40 | 90 days supply | Qty: 90 | Fill #3

## 2018-05-19 DIAGNOSIS — I10 Essential (primary) hypertension: Secondary | ICD-10-CM | POA: Diagnosis not present

## 2018-05-19 DIAGNOSIS — I2510 Atherosclerotic heart disease of native coronary artery without angina pectoris: Secondary | ICD-10-CM | POA: Diagnosis not present

## 2018-05-19 DIAGNOSIS — M2550 Pain in unspecified joint: Secondary | ICD-10-CM | POA: Diagnosis not present

## 2018-05-19 DIAGNOSIS — K429 Umbilical hernia without obstruction or gangrene: Secondary | ICD-10-CM | POA: Diagnosis not present

## 2018-05-19 DIAGNOSIS — K219 Gastro-esophageal reflux disease without esophagitis: Secondary | ICD-10-CM | POA: Diagnosis not present

## 2018-05-19 DIAGNOSIS — E7800 Pure hypercholesterolemia, unspecified: Secondary | ICD-10-CM | POA: Diagnosis not present

## 2018-05-19 DIAGNOSIS — N400 Enlarged prostate without lower urinary tract symptoms: Secondary | ICD-10-CM | POA: Diagnosis not present

## 2018-05-30 MED FILL — ATORVASTATIN 40 MG TABLET: 40 | 30 days supply | Qty: 30 | Fill #0

## 2018-06-03 MED FILL — ALFUZOSIN HCL ER 10 MG TAB: 10 | 90 days supply | Qty: 90 | Fill #0

## 2018-06-18 MED FILL — AMOXICILLIN 500 MG CAPSULE: 500 | 10 days supply | Qty: 30 | Fill #0

## 2018-06-23 MED FILL — HYDROCHLOROTHIAZIDE 25 MG T: 25 | 90 days supply | Qty: 45 | Fill #0

## 2018-08-25 MED FILL — ATORVASTATIN 40 MG TABLET: 40 | 30 days supply | Qty: 30 | Fill #1

## 2018-09-01 MED FILL — SIMVASTATIN 40 MG TABLET: 40 | 90 days supply | Qty: 90 | Fill #0

## 2018-09-01 MED FILL — ALFUZOSIN HCL ER 10 MG TAB: 10 | 90 days supply | Qty: 90 | Fill #1

## 2018-09-29 MED FILL — HYDROCHLOROTHIAZIDE 25 MG T: 25 | 90 days supply | Qty: 45 | Fill #1

## 2018-10-28 DIAGNOSIS — M2550 Pain in unspecified joint: Secondary | ICD-10-CM | POA: Diagnosis not present

## 2018-10-28 DIAGNOSIS — B349 Viral infection, unspecified: Secondary | ICD-10-CM | POA: Diagnosis not present

## 2018-10-28 MED FILL — DICLOFENAC SODIUM 75 MG TAB: 75 | 30 days supply | Qty: 60 | Fill #0

## 2018-11-26 DIAGNOSIS — L84 Corns and callosities: Secondary | ICD-10-CM | POA: Diagnosis not present

## 2018-11-26 DIAGNOSIS — M2550 Pain in unspecified joint: Secondary | ICD-10-CM | POA: Diagnosis not present

## 2018-11-26 DIAGNOSIS — N400 Enlarged prostate without lower urinary tract symptoms: Secondary | ICD-10-CM | POA: Diagnosis not present

## 2018-11-26 DIAGNOSIS — I2510 Atherosclerotic heart disease of native coronary artery without angina pectoris: Secondary | ICD-10-CM | POA: Diagnosis not present

## 2018-11-26 DIAGNOSIS — E7800 Pure hypercholesterolemia, unspecified: Secondary | ICD-10-CM | POA: Diagnosis not present

## 2018-11-26 DIAGNOSIS — I10 Essential (primary) hypertension: Secondary | ICD-10-CM | POA: Diagnosis not present

## 2018-11-26 MED FILL — SIMVASTATIN 40 MG TABLET: 40 | 90 days supply | Qty: 90 | Fill #0

## 2018-11-26 MED FILL — ALFUZOSIN HCL ER 10 MG TAB: 10 | 90 days supply | Qty: 90 | Fill #2

## 2018-12-30 MED FILL — HYDROCHLOROTHIAZIDE 25 MG T: 25 | 90 days supply | Qty: 45 | Fill #0

## 2019-02-25 MED FILL — SIMVASTATIN 40 MG TABLET: 40 | 90 days supply | Qty: 90 | Fill #0

## 2019-02-25 MED FILL — PANTOPRAZOLE SOD DR 40 MG T: 40 | 90 days supply | Qty: 90 | Fill #0

## 2019-02-25 MED FILL — ALFUZOSIN HCL ER 10 MG TAB: 10 | 90 days supply | Qty: 90 | Fill #0

## 2019-03-30 MED FILL — HYDROCHLOROTHIAZIDE 25 MG T: 25 | 90 days supply | Qty: 45 | Fill #1

## 2019-05-25 MED FILL — SIMVASTATIN 40 MG TABLET: 40 | 90 days supply | Qty: 90 | Fill #0

## 2019-05-25 MED FILL — PANTOPRAZOLE SOD DR 40 MG T: 40 | 90 days supply | Qty: 90 | Fill #0

## 2019-05-25 MED FILL — ALFUZOSIN HCL ER 10 MG TAB: 10 | 90 days supply | Qty: 90 | Fill #0

## 2019-05-28 DIAGNOSIS — N400 Enlarged prostate without lower urinary tract symptoms: Secondary | ICD-10-CM | POA: Diagnosis not present

## 2019-05-28 DIAGNOSIS — K219 Gastro-esophageal reflux disease without esophagitis: Secondary | ICD-10-CM | POA: Diagnosis not present

## 2019-05-28 DIAGNOSIS — I2510 Atherosclerotic heart disease of native coronary artery without angina pectoris: Secondary | ICD-10-CM | POA: Diagnosis not present

## 2019-05-28 DIAGNOSIS — E7800 Pure hypercholesterolemia, unspecified: Secondary | ICD-10-CM | POA: Diagnosis not present

## 2019-05-28 DIAGNOSIS — M2550 Pain in unspecified joint: Secondary | ICD-10-CM | POA: Diagnosis not present

## 2019-05-28 DIAGNOSIS — Z125 Encounter for screening for malignant neoplasm of prostate: Secondary | ICD-10-CM | POA: Diagnosis not present

## 2019-05-28 DIAGNOSIS — I10 Essential (primary) hypertension: Secondary | ICD-10-CM | POA: Diagnosis not present

## 2019-05-28 DIAGNOSIS — M79671 Pain in right foot: Secondary | ICD-10-CM | POA: Diagnosis not present

## 2019-05-29 DIAGNOSIS — M2550 Pain in unspecified joint: Secondary | ICD-10-CM | POA: Diagnosis not present

## 2019-05-29 DIAGNOSIS — Z125 Encounter for screening for malignant neoplasm of prostate: Secondary | ICD-10-CM | POA: Diagnosis not present

## 2019-05-29 DIAGNOSIS — I10 Essential (primary) hypertension: Secondary | ICD-10-CM | POA: Diagnosis not present

## 2019-06-22 ENCOUNTER — Ambulatory Visit (INDEPENDENT_AMBULATORY_CARE_PROVIDER_SITE_OTHER): Payer: 59 | Admitting: Podiatry

## 2019-06-22 ENCOUNTER — Other Ambulatory Visit: Payer: Self-pay

## 2019-06-22 ENCOUNTER — Other Ambulatory Visit: Payer: Self-pay | Admitting: Podiatry

## 2019-06-22 ENCOUNTER — Ambulatory Visit (INDEPENDENT_AMBULATORY_CARE_PROVIDER_SITE_OTHER): Payer: 59

## 2019-06-22 ENCOUNTER — Encounter: Payer: Self-pay | Admitting: Podiatry

## 2019-06-22 VITALS — BP 107/72 | HR 52

## 2019-06-22 DIAGNOSIS — M7752 Other enthesopathy of left foot: Secondary | ICD-10-CM | POA: Diagnosis not present

## 2019-06-22 DIAGNOSIS — M2042 Other hammer toe(s) (acquired), left foot: Secondary | ICD-10-CM | POA: Diagnosis not present

## 2019-06-22 DIAGNOSIS — M779 Enthesopathy, unspecified: Secondary | ICD-10-CM

## 2019-06-22 DIAGNOSIS — M21619 Bunion of unspecified foot: Secondary | ICD-10-CM

## 2019-06-22 DIAGNOSIS — M722 Plantar fascial fibromatosis: Secondary | ICD-10-CM

## 2019-06-22 DIAGNOSIS — M79672 Pain in left foot: Secondary | ICD-10-CM

## 2019-06-22 DIAGNOSIS — M79671 Pain in right foot: Secondary | ICD-10-CM

## 2019-06-22 MED ORDER — DICLOFENAC SODIUM 75 MG PO TBEC: 75.0000 mg | DELAYED_RELEASE_TABLET | Freq: Two times a day (BID) | ORAL | 2 refills | Status: DC

## 2019-06-22 MED FILL — DICLOFENAC SODIUM 75 MG TAB: 75 | 25 days supply | Qty: 50 | Fill #0

## 2019-06-22 NOTE — Progress Notes (Signed)
Subjective:   Patient ID: Nicholas [name], male   DOB: 63 y.o.   MRN: [id number]   HPI Patient presents with quite a bit of pain in the left digits and in the right heel stating the left is been hurting at least 6 months the right for about 3 months.  Patient does work on his feet and and does not smoke and likes to be active   Review of Systems  All other systems reviewed and are negative.       Objective:  Physical Exam Vitals signs and nursing note reviewed.  Constitutional:      Appearance: He is well-developed.  Pulmonary:     Effort: Pulmonary effort is normal.  Musculoskeletal: Normal range of motion.  Skin:    General: Skin is warm.  Neurological:     Mental Status: He is alert.     Neurovascular status found to be intact muscle strength found to be adequate range of motion within normal limits.  Patient is noted to have exquisite discomfort in the plantar heel right at the insertional point tendon calcaneus inflammation fluid noted and is found to have severe keratotic lesions with fluid buildup between the second and third digits left foot with inflammation noted with each and significant structural bunion deformity noted bilateral     Assessment:  Acute plantar fasciitis right with inflammatory keratotic lesions digits 2 3 left with fluid buildup     Plan:  H&P discussed both conditions and at this time for the plantar heel did sterile prep and injected the tendon 3 mg Kenalog 5 mg Xylocaine and for the left I went ahead did proximal nerve block I did small injections of the inner phalangeal joint 2 mg dexamethasone and debrided the lesions on both and applied padding and discussed possible surgical intervention of this left foot with possible bunion hammertoe correction  X-rays indicate there is significant structural malalignment of both feet with alignment and pressure between the second and third digits left foot

## 2019-07-13 MED FILL — HYDROCHLOROTHIAZIDE 25 MG T: 25 | 90 days supply | Qty: 45 | Fill #0

## 2019-07-20 ENCOUNTER — Other Ambulatory Visit: Payer: Self-pay

## 2019-07-20 ENCOUNTER — Ambulatory Visit: Payer: 59 | Admitting: Podiatry

## 2019-07-20 ENCOUNTER — Encounter: Payer: Self-pay | Admitting: Podiatry

## 2019-07-20 DIAGNOSIS — M2042 Other hammer toe(s) (acquired), left foot: Secondary | ICD-10-CM

## 2019-07-20 DIAGNOSIS — M722 Plantar fascial fibromatosis: Secondary | ICD-10-CM | POA: Diagnosis not present

## 2019-07-24 MED FILL — DICLOFENAC SODIUM 75 MG TAB: 75 | 25 days supply | Qty: 50 | Fill #1

## 2019-07-27 NOTE — Progress Notes (Signed)
Subjective:   Patient ID: Nicholas [name], male   DOB: 63 y.o.   MRN: [id number]   HPI Patient states improving left with mild discomfort but overall very pleased   ROS      Objective:  Physical Exam  Neurovascular status intact with significant diminishment of discomfort plantar left heel with pain only upon deep palpation     Assessment:  Doing well from having inflammatory condition left with minimal discomfort upon palpation     Plan:  Reviewed condition again discussed the continuation of stretching exercises anti-inflammatories and supportive shoe gear usage.  Patient will be seen back to recheck as needed

## 2019-08-01 DIAGNOSIS — Z23 Encounter for immunization: Secondary | ICD-10-CM | POA: Diagnosis not present

## 2019-08-20 MED FILL — ALFUZOSIN HCL ER 10 MG TAB: 10 | 90 days supply | Qty: 90 | Fill #0

## 2019-09-09 MED FILL — DICLOFENAC SODIUM 75 MG TAB: 75 | 25 days supply | Qty: 50 | Fill #2

## 2019-09-09 MED FILL — SIMVASTATIN 40 MG TABLET: 40 | 90 days supply | Qty: 90 | Fill #1

## 2019-10-05 MED FILL — DICLOFENAC SODIUM 75 MG TAB: 75 | 30 days supply | Qty: 60 | Fill #1

## 2019-10-12 MED FILL — HYDROCHLOROTHIAZIDE 25 MG T: 25 | 90 days supply | Qty: 45 | Fill #0

## 2019-11-17 MED FILL — ALFUZOSIN HCL ER 10 MG TB24: 10 | 90 days supply | Qty: 90 | Fill #0

## 2019-11-17 MED FILL — TRIAMCINOLONE 0.5% CREAM: 0.5 | 15 days supply | Qty: 30 | Fill #0

## 2019-11-17 MED FILL — DICLOFENAC SOD EC 75 MG TAB: 75 | 90 days supply | Qty: 180 | Fill #0

## 2019-11-24 DIAGNOSIS — H25813 Combined forms of age-related cataract, bilateral: Secondary | ICD-10-CM | POA: Diagnosis not present

## 2019-11-24 DIAGNOSIS — H524 Presbyopia: Secondary | ICD-10-CM | POA: Diagnosis not present

## 2019-11-24 DIAGNOSIS — H5203 Hypermetropia, bilateral: Secondary | ICD-10-CM | POA: Diagnosis not present

## 2019-11-30 DIAGNOSIS — I10 Essential (primary) hypertension: Secondary | ICD-10-CM | POA: Diagnosis not present

## 2019-11-30 DIAGNOSIS — M2550 Pain in unspecified joint: Secondary | ICD-10-CM | POA: Diagnosis not present

## 2019-11-30 DIAGNOSIS — K219 Gastro-esophageal reflux disease without esophagitis: Secondary | ICD-10-CM | POA: Diagnosis not present

## 2019-11-30 DIAGNOSIS — E7800 Pure hypercholesterolemia, unspecified: Secondary | ICD-10-CM | POA: Diagnosis not present

## 2019-11-30 DIAGNOSIS — R200 Anesthesia of skin: Secondary | ICD-10-CM | POA: Diagnosis not present

## 2019-11-30 DIAGNOSIS — I2510 Atherosclerotic heart disease of native coronary artery without angina pectoris: Secondary | ICD-10-CM | POA: Diagnosis not present

## 2019-11-30 DIAGNOSIS — N400 Enlarged prostate without lower urinary tract symptoms: Secondary | ICD-10-CM | POA: Diagnosis not present

## 2019-12-14 MED FILL — SIMVASTATIN 40 MG TABLET: 40 | 90 days supply | Qty: 90 | Fill #2

## 2020-01-13 MED FILL — HYDROCHLOROTHIAZIDE 25 MG T: 25 | 90 days supply | Qty: 45 | Fill #0

## 2020-02-22 MED FILL — ALFUZOSIN HCL ER 10 MG TB24: 10 | 90 days supply | Qty: 90 | Fill #1

## 2020-03-02 MED FILL — DICLOFENAC SOD EC 75 MG TAB: 75 | 90 days supply | Qty: 180 | Fill #1

## 2020-03-21 MED FILL — DICLOFENAC SOD EC 75 MG TAB: 75 | 90 days supply | Qty: 180 | Fill #1

## 2020-03-22 ENCOUNTER — Other Ambulatory Visit (HOSPITAL_COMMUNITY): Payer: Self-pay | Admitting: Family Medicine

## 2020-03-22 MED FILL — SIMVASTATIN 40 MG TABLET: 40 | 90 days supply | Qty: 90 | Fill #0

## 2020-03-23 ENCOUNTER — Telehealth: Payer: Self-pay | Admitting: Cardiology

## 2020-03-23 NOTE — Telephone Encounter (Signed)
Patients wife called to schedule appt. Advised we would need a new referral beings that patient has not been seen since 2017.

## 2020-03-29 ENCOUNTER — Other Ambulatory Visit (HOSPITAL_COMMUNITY): Payer: Self-pay | Admitting: Family Medicine

## 2020-04-19 MED FILL — HYDROCHLOROTHIAZIDE 25 MG T: 25 | 90 days supply | Qty: 45 | Fill #0

## 2020-05-23 ENCOUNTER — Other Ambulatory Visit (HOSPITAL_COMMUNITY): Payer: Self-pay | Admitting: Family Medicine

## 2020-05-23 MED FILL — ALFUZOSIN HCL ER 10 MG TB24: 10 | 90 days supply | Qty: 90 | Fill #0

## 2020-06-08 DIAGNOSIS — I10 Essential (primary) hypertension: Secondary | ICD-10-CM | POA: Diagnosis not present

## 2020-06-08 DIAGNOSIS — Z125 Encounter for screening for malignant neoplasm of prostate: Secondary | ICD-10-CM | POA: Diagnosis not present

## 2020-06-08 DIAGNOSIS — K219 Gastro-esophageal reflux disease without esophagitis: Secondary | ICD-10-CM | POA: Diagnosis not present

## 2020-06-08 DIAGNOSIS — I2510 Atherosclerotic heart disease of native coronary artery without angina pectoris: Secondary | ICD-10-CM | POA: Diagnosis not present

## 2020-06-08 DIAGNOSIS — E7800 Pure hypercholesterolemia, unspecified: Secondary | ICD-10-CM | POA: Diagnosis not present

## 2020-06-08 DIAGNOSIS — N400 Enlarged prostate without lower urinary tract symptoms: Secondary | ICD-10-CM | POA: Diagnosis not present

## 2020-06-08 DIAGNOSIS — M2550 Pain in unspecified joint: Secondary | ICD-10-CM | POA: Diagnosis not present

## 2020-07-08 MED FILL — SIMVASTATIN 40 MG TABLET: 40 | 90 days supply | Qty: 90 | Fill #1

## 2020-08-15 MED FILL — DICLOFENAC SOD EC 75 MG TAB: 75 | 90 days supply | Qty: 180 | Fill #0

## 2020-08-15 MED FILL — ALFUZOSIN HCL ER 10 MG TB24: 10 | 90 days supply | Qty: 90 | Fill #1

## 2020-08-24 ENCOUNTER — Ambulatory Visit: Payer: 59 | Admitting: Podiatry

## 2020-09-05 ENCOUNTER — Ambulatory Visit: Payer: 59 | Admitting: Podiatry

## 2020-09-05 ENCOUNTER — Other Ambulatory Visit: Payer: Self-pay

## 2020-09-05 ENCOUNTER — Ambulatory Visit (INDEPENDENT_AMBULATORY_CARE_PROVIDER_SITE_OTHER): Payer: 59

## 2020-09-05 DIAGNOSIS — M79672 Pain in left foot: Secondary | ICD-10-CM

## 2020-09-05 DIAGNOSIS — M21619 Bunion of unspecified foot: Secondary | ICD-10-CM

## 2020-09-05 DIAGNOSIS — M779 Enthesopathy, unspecified: Secondary | ICD-10-CM | POA: Diagnosis not present

## 2020-09-05 MED ORDER — TRIAMCINOLONE ACETONIDE 10 MG/ML IJ SUSP
10.0000 mg | Freq: Once | INTRAMUSCULAR | Status: AC
  Administered 2020-09-05: 10 mg

## 2020-09-07 NOTE — Progress Notes (Signed)
Subjective:   Patient ID: Nicholas [name], male   DOB: 64 y.o.   MRN: [id number]   HPI Patient states he has had an increase in pain around his big toe joint left and states it has been over the last month.  States he did very well and was pleased with the response last time but it started to hurt again neuro   ROS      Objective:  Physical Exam  Vascular status intact with inflammation pain around the first MPJ left with moderate bunion deformity also noted as part of pathology     Assessment:  Inflammatory capsulitis moderate hallux limitus HAV deformity left with pain     Plan:  H&P x-ray reviewed sterile prep done injected around the first MPJ 3 mg Kenalog 5 mg Xylocaine advised on anti-inflammatories rigid bottom shoes and reappoint as needed ultimately possibly requiring surgical intervention in this case  X-rays indicate that there is moderate bunion deformity left with compression of the lateral side of the joint surface

## 2020-09-13 ENCOUNTER — Other Ambulatory Visit: Payer: Self-pay | Admitting: Podiatry

## 2020-09-13 DIAGNOSIS — M779 Enthesopathy, unspecified: Secondary | ICD-10-CM

## 2020-09-15 ENCOUNTER — Other Ambulatory Visit: Payer: Self-pay

## 2020-09-15 ENCOUNTER — Ambulatory Visit: Payer: 59 | Admitting: Podiatry

## 2020-09-15 ENCOUNTER — Encounter: Payer: Self-pay | Admitting: Podiatry

## 2020-09-15 DIAGNOSIS — M2042 Other hammer toe(s) (acquired), left foot: Secondary | ICD-10-CM

## 2020-09-15 DIAGNOSIS — M21619 Bunion of unspecified foot: Secondary | ICD-10-CM

## 2020-09-15 DIAGNOSIS — M779 Enthesopathy, unspecified: Secondary | ICD-10-CM

## 2020-09-15 NOTE — Progress Notes (Signed)
Subjective:   Patient ID: Nicholas [name], male   DOB: 64 y.o.   MRN: [id number]   HPI Patient presents stating the bunion has really been bothering him and also between the second and third toes he is got inflammation lesion and its been painful when pressed   ROS      Objective:  Physical Exam  Neurovascular status intact with structural bunion deformity left pain on the joint and into the bunion deformity with keratotic lesion second third digit left with irritation and fluid buildup around the inner phalangeal joint     Assessment:  HAV deformity with probable hallux limitus component left along with inflammatory capsulitis interphalangeal joint digit to left with keratotic lesion second third toes     Plan:  H&P discussed chronic nature of this condition which is been present for several years and also discussed with his wife who is here today.  We have agreed that distal osteotomy left along with arthroplasty digits 2 and 3 will be best long-term but will get a try first before we do this to get it better conservatively and I did do a sterile prep and injected the inner phalangeal joint digit to left 1 mg Kenalog 3 mg Xylocaine debrided the lesions applied padding and patient will be seen back 5 weeks or may decide to do the surgery earlier and I made him aware he can call and gave him the number for scheduler

## 2020-10-11 MED FILL — SIMVASTATIN 40 MG TABLET: 40 | 90 days supply | Qty: 90 | Fill #2

## 2020-11-21 MED FILL — ALFUZOSIN HCL ER 10 MG TB24: 10 | 90 days supply | Qty: 90 | Fill #2

## 2020-12-15 DIAGNOSIS — M2550 Pain in unspecified joint: Secondary | ICD-10-CM | POA: Diagnosis not present

## 2020-12-15 DIAGNOSIS — I10 Essential (primary) hypertension: Secondary | ICD-10-CM | POA: Diagnosis not present

## 2020-12-15 DIAGNOSIS — Z23 Encounter for immunization: Secondary | ICD-10-CM | POA: Diagnosis not present

## 2020-12-15 DIAGNOSIS — N400 Enlarged prostate without lower urinary tract symptoms: Secondary | ICD-10-CM | POA: Diagnosis not present

## 2020-12-15 DIAGNOSIS — K219 Gastro-esophageal reflux disease without esophagitis: Secondary | ICD-10-CM | POA: Diagnosis not present

## 2020-12-15 DIAGNOSIS — I2510 Atherosclerotic heart disease of native coronary artery without angina pectoris: Secondary | ICD-10-CM | POA: Diagnosis not present

## 2020-12-15 DIAGNOSIS — E7800 Pure hypercholesterolemia, unspecified: Secondary | ICD-10-CM | POA: Diagnosis not present

## 2021-01-05 ENCOUNTER — Other Ambulatory Visit (HOSPITAL_COMMUNITY): Payer: Self-pay | Admitting: Family Medicine

## 2021-01-05 MED FILL — HYDROCHLOROTHIAZIDE 25 MG T: 25 | 90 days supply | Qty: 45 | Fill #0

## 2021-01-06 ENCOUNTER — Ambulatory Visit (HOSPITAL_COMMUNITY)
Admission: EM | Admit: 2021-01-06 | Discharge: 2021-01-06 | Disposition: A | Discharge status: Home / Self Care | Payer: 59 | Attending: Physician Assistant | Admitting: Physician Assistant

## 2021-01-06 ENCOUNTER — Other Ambulatory Visit (HOSPITAL_COMMUNITY): Payer: Self-pay | Admitting: Physician Assistant

## 2021-01-06 ENCOUNTER — Encounter (HOSPITAL_COMMUNITY): Payer: Self-pay

## 2021-01-06 ENCOUNTER — Other Ambulatory Visit: Payer: Self-pay

## 2021-01-06 DIAGNOSIS — M79672 Pain in left foot: Secondary | ICD-10-CM

## 2021-01-06 DIAGNOSIS — L84 Corns and callosities: Secondary | ICD-10-CM | POA: Diagnosis not present

## 2021-01-06 MED ORDER — PREDNISONE 10 MG PO TABS: 20.0000 mg | ORAL_TABLET | Freq: Every day | ORAL | 0 refills | Status: DC

## 2021-01-06 MED FILL — predniSONE 10 MG TABS: 10 | 4 days supply | Qty: 8 | Fill #0

## 2021-01-06 NOTE — ED Triage Notes (Signed)
Pt in with c/o left foot pain that has worsened over the past 3 days  States he has a corn on his 2nd toe that has been causing severe pain

## 2021-01-06 NOTE — Discharge Instructions (Addendum)
Take the prednisone for inflammation. Follow up with your podiatrist. You cannot take any NSAIDs including aspirin, ibuprofen, naproxen, diclofenac with this medication if it can lead to GI bleeding. If you develop any numbness or tingling or worsening pain you need to go to the ER.

## 2021-01-06 NOTE — ED Provider Notes (Signed)
[place]    CSN: [id number] Arrival date & time: 01/06/21  1245      History   Chief Complaint Chief Complaint  Patient presents with  . Foot problem    HPI Nicholas [name] is a 65 y.o. male.   Mr. Yeagle presents today with a several day history of worsening left foot pain. He has a history of bunion, hammer toe, and callus that is followed by podiatry. He did call them to be seen given worsening pain but they were not able to see him for another week. Reports that pain is rated 10 on a 0-10 pain scale, localized to left second toe, described as sharp, worse with palpation, no alleviating factors. He has previously had improvement with cortisone injection and is requesting that today if possible. He has been taking prescribed NSAID (diclofenac) and has been taking that without improvement. He is unable to preform daily activity as a result of symptoms.      Past Medical History:  Diagnosis Date  . Arthritis    knees, hands  . Cataract   . Coronary artery disease   . Dysrhythmia 06/2014   sinus brady with PVC'S  . GERD (gastroesophageal reflux disease)   . Hepatitis C   . Hx of adenomatous colonic polyps 09/21/2014  . Hypercholesteremia   . Hypertension   . Myocardial infarction [facility])     Patient Active Problem List   Diagnosis Date Noted  . Liver fibrosis 12/19/2015  . Chronic hepatitis C without hepatic coma ([place]) 04/13/2015  . Hx of adenomatous colonic polyps 09/21/2014  . CAD- CABG x 3 in [place] '03 05/21/2014  . Dizziness when bending over 05/21/2014  . Frequent PVCs 05/21/2014  . Sinus bradycardia 05/21/2014  . Chronic renal insufficiency, stage II (mild) 05/21/2014  . Dyslipidemia 05/21/2014  . Near syncope 05/21/2014    Past Surgical History:  Procedure Laterality Date  . CARDIAC SURGERY     triple bypass  . COLONOSCOPY    . CORONARY ARTERY BYPASS GRAFT     CABG x 3 Vessels       Home Medications    Prior to Admission  medications   Medication Sig Start Date End Date Taking? Authorizing Provider  predniSONE (DELTASONE) 10 MG tablet Take 2 tablets (20 mg total) by mouth daily for 4 days. 01/06/21 01/10/21 Yes Phylicia Mcgaugh, Derry Skill, PA-C  alfuzosin (UROXATRAL) 10 MG 24 hr tablet Take 10 mg by mouth every evening.    [provider]  aspirin EC 81 MG tablet Take 81 mg by mouth daily with breakfast.    [provider]  diclofenac (VOLTAREN) 75 MG EC tablet Take 1 tablet (75 mg total) by mouth 2 (two) times daily. 06/22/19   Wallene Huh, DPM  hydrochlorothiazide (HYDRODIURIL) 25 MG tablet TAKE 1/2 TABLET BY MOUTH DAILY. 07/02/17   Jerline Pain, MD  ibuprofen (ADVIL,MOTRIN) 600 MG tablet Take 1 tablet (600 mg total) by mouth every 6 (six) hours as needed. 04/13/15   Antonietta Breach, PA-C  nitroGLYCERIN (NITROSTAT) 0.4 MG SL tablet Place 1 tablet (0.4 mg total) under the tongue every 5 (five) minutes x 3 doses as needed for chest pain. Patient not taking: Reported on 06/22/2019 05/22/16   Jerline Pain, MD  pantoprazole (PROTONIX) 40 MG tablet  05/25/19   [provider]  simvastatin (ZOCOR) 40 MG tablet TAKE 1 TABLET (40 MG TOTAL) BY MOUTH EVERY EVENING. Patient not taking: Reported on 06/22/2019 07/02/17  Jerline Pain, MD    Family History Family History  Problem Relation Age of Onset  . Diabetic kidney disease Mother   . Kidney disease Mother   . Throat cancer Mother   . Cancer Mother   . Diabetes Mother   . Hypertension Mother   . Esophageal cancer Mother   . Other Father        unknown  . Hypertension Father   . Heart attack Neg Hx   . Colon cancer Neg Hx   . Stomach cancer Neg Hx   . Rectal cancer Neg Hx   . Colon polyps Neg Hx     Social History Social History   Tobacco Use  . Smoking status: Former Smoker    Quit date: 06/10/2002    Years since quitting: 18.5  . Smokeless tobacco: Never Used  Vaping Use  . Vaping Use: Never used  Substance Use Topics  . Alcohol use: Yes     Alcohol/week: 0.0 standard drinks    Comment: a few mickey's  . Drug use: Yes    Types: Marijuana    Comment: last usuage 02/19/18     Allergies   Patient has no known allergies.   Review of Systems Review of Systems  Constitutional: Positive for activity change. Negative for appetite change, fatigue and fever.  Musculoskeletal: Positive for arthralgias. Negative for myalgias.  Skin: Negative for color change and wound.  Neurological: Negative for dizziness, weakness, light-headedness, numbness and headaches.     Physical Exam Triage Vital Signs ED Triage Vitals  Enc Vitals Group     BP 01/06/21 1430 (!) 148/89     Pulse Rate 01/06/21 1430 65     Resp 01/06/21 1430 19     Temp 01/06/21 1430 98.2 F (36.8 C)     Temp src --      SpO2 01/06/21 1430 99 %     Weight --      Height --      Head Circumference --      Peak Flow --      Pain Score 01/06/21 1429 10     Pain Loc --      Pain Edu? --      Excl. in [place]? --    No data found.  Updated Vital Signs BP (!) 148/89   Pulse 65   Temp 98.2 F (36.8 C)   Resp 19   SpO2 99%   Visual Acuity Right Eye Distance:   Left Eye Distance:   Bilateral Distance:    Right Eye Near:   Left Eye Near:    Bilateral Near:     Physical Exam Vitals reviewed.  Constitutional:      General: He is awake.     Appearance: Normal appearance. He is normal weight. He is not ill-appearing.     Comments: Pleasant male appears stated age in no acute distress.   HENT:     Head: Normocephalic and atraumatic.     Mouth/Throat:     Pharynx: No oropharyngeal exudate, posterior oropharyngeal erythema or uvula swelling.  Cardiovascular:     Rate and Rhythm: Normal rate and regular rhythm.     Pulses:          Posterior tibial pulses are 2+ on the left side.     Heart sounds: No murmur heard.   Pulmonary:     Effort: Pulmonary effort is normal.     Breath sounds: Normal breath sounds. No stridor. No wheezing,  rhonchi or rales.   Abdominal:     General: Bowel sounds are normal.     Palpations: Abdomen is soft.     Tenderness: There is no abdominal tenderness.  Musculoskeletal:     Left lower leg: No edema.     Left foot: Normal range of motion. Bunion present. No deformity.  Feet:     Left foot:     Skin integrity: Callus present. No ulcer, blister or skin breakdown.     Toenail Condition: Left toenails are abnormally thick.     Comments: Callus noted lateral left second toe. No wound, bleeding, erythema, streaking, drainage noted. Mild tenderness to palpation on exam.  Neurological:     Mental Status: He is alert.  Psychiatric:        Behavior: Behavior is cooperative.      UC Treatments / Results  Labs (all labs ordered are listed, but only abnormal results are displayed) Labs Reviewed - No data to display  EKG   Radiology No results found.  Procedures Procedures (including critical care time)  Medications Ordered in UC Medications - No data to display  Initial Impression / Assessment and Plan / UC Course  I have reviewed the triage vital signs and the nursing notes.  Pertinent labs & imaging results that were available during my care of the patient were reviewed by me and considered in my medical decision making (see chart for details).     No indication for imaging given no bony tenderness on exam and no history of trauma. Discussed that we are unable to provide cortisone injection into lesion and patient was upset but understood. Discussed that he will need to follow up with podiatry for ongoing management to which he expressed understanding; has appointment scheduled. He was given prednisone burst to see if that will help improve symptoms. Patient was instructed not to take NSAIDs with the medication due to risk of GI bleeding. Strict return precaution given to which patient expressed understanding.   Final Clinical Impressions(s) / UC Diagnoses   Final diagnoses:  Left foot pain   Callus of foot     Discharge Instructions     Take the prednisone for inflammation. Follow up with your podiatrist. You cannot take any NSAIDs including aspirin, ibuprofen, naproxen, diclofenac with this medication if it can lead to GI bleeding. If you develop any numbness or tingling or worsening pain you need to go to the ER.     ED Prescriptions    Medication Sig Dispense Auth. Provider   predniSONE (DELTASONE) 10 MG tablet Take 2 tablets (20 mg total) by mouth daily for 4 days. 8 tablet Cordelia Bessinger, Derry Skill, PA-C     PDMP not reviewed this encounter.   Terrilee Croak, PA-C 01/06/21 1550

## 2021-01-16 ENCOUNTER — Other Ambulatory Visit (HOSPITAL_COMMUNITY): Payer: Self-pay

## 2021-01-16 ENCOUNTER — Encounter: Payer: Self-pay | Admitting: Podiatry

## 2021-01-16 ENCOUNTER — Other Ambulatory Visit: Payer: Self-pay

## 2021-01-16 ENCOUNTER — Ambulatory Visit: Payer: 59 | Admitting: Podiatry

## 2021-01-16 DIAGNOSIS — I999 Unspecified disorder of circulatory system: Secondary | ICD-10-CM

## 2021-01-16 DIAGNOSIS — M779 Enthesopathy, unspecified: Secondary | ICD-10-CM

## 2021-01-16 DIAGNOSIS — M778 Other enthesopathies, not elsewhere classified: Secondary | ICD-10-CM | POA: Diagnosis not present

## 2021-01-16 DIAGNOSIS — M2042 Other hammer toe(s) (acquired), left foot: Secondary | ICD-10-CM

## 2021-01-16 MED ORDER — TRIAMCINOLONE ACETONIDE 10 MG/ML IJ SUSP
10.0000 mg | Freq: Once | INTRAMUSCULAR | Status: AC
  Administered 2021-01-16: 10 mg

## 2021-01-16 MED ORDER — NAPROXEN 500 MG PO TABS
500.0000 mg | ORAL_TABLET | Freq: Two times a day (BID) | ORAL | 1 refills | Status: DC
  Filled 2021-01-16: qty 60, 30d supply, fill #0
  Filled 2021-03-13: qty 60, 30d supply, fill #1

## 2021-01-16 NOTE — Progress Notes (Signed)
Subjective:   Patient ID: Nicholas [name], male   DOB: 65 y.o.   MRN: [id number]   HPI Patient states that he really wants to get his foot fixed but he needs to hold off a little bit longer   ROS      Objective:  Physical Exam  Neurovascular status moderately reduced as far as pulses PT DP bilateral with questioning indicating moderate claudication symptomatology with patient's left foot showing breakdown of tissue between the second and third digits structural bunion deformity and also inflammation pain of the second metatarsal phalangeal joint     Assessment:  Inflammatory capsulitis along with slight breakdown of tissue between the second and third toes that is irritated with hammertoe deformity bunion deformity     Plan:  H&P reviewed conditions applied padding between the toes did sterile prep and injected around the second MPJ periarticular 3 mg Dexasone Kenalog 5 mg Xylocaine.  I did discuss the possibility for any kind of vascular issue which may be part of the discomfort that he experiences and as precautionary measure I am sending him out for ABI studies with possible intervention if any type of issues were to be found.  Patient wants surgery and will hopefully be able to have done in the next 3 months

## 2021-01-17 ENCOUNTER — Other Ambulatory Visit (HOSPITAL_COMMUNITY): Payer: Self-pay

## 2021-01-19 ENCOUNTER — Other Ambulatory Visit: Payer: Self-pay | Admitting: Podiatry

## 2021-01-19 DIAGNOSIS — I999 Unspecified disorder of circulatory system: Secondary | ICD-10-CM

## 2021-01-24 ENCOUNTER — Ambulatory Visit (HOSPITAL_COMMUNITY)
Admission: RE | Admit: 2021-01-24 | Discharge: 2021-01-24 | Disposition: A | Discharge status: Home / Self Care | Payer: 59 | Source: Ambulatory Visit | Attending: Cardiology | Admitting: Cardiology

## 2021-01-24 ENCOUNTER — Other Ambulatory Visit: Payer: Self-pay

## 2021-01-24 DIAGNOSIS — M79672 Pain in left foot: Secondary | ICD-10-CM | POA: Diagnosis not present

## 2021-01-24 DIAGNOSIS — I999 Unspecified disorder of circulatory system: Secondary | ICD-10-CM | POA: Insufficient documentation

## 2021-02-15 ENCOUNTER — Other Ambulatory Visit (HOSPITAL_COMMUNITY): Payer: Self-pay

## 2021-02-15 DIAGNOSIS — Z23 Encounter for immunization: Secondary | ICD-10-CM | POA: Diagnosis not present

## 2021-02-15 MED FILL — Alfuzosin HCl Tab ER 24HR 10 MG: ORAL | 90 days supply | Qty: 90 | Fill #0 | Status: AC

## 2021-02-25 DIAGNOSIS — H5203 Hypermetropia, bilateral: Secondary | ICD-10-CM | POA: Diagnosis not present

## 2021-03-13 ENCOUNTER — Other Ambulatory Visit (HOSPITAL_COMMUNITY): Payer: Self-pay

## 2021-03-13 MED ORDER — PANTOPRAZOLE SODIUM 40 MG PO TBEC
40.0000 mg | DELAYED_RELEASE_TABLET | Freq: Every day | ORAL | 1 refills | Status: DC | PRN
  Filled 2021-03-13: qty 90, 90d supply, fill #0

## 2021-03-13 MED FILL — Simvastatin Tab 40 MG: ORAL | 90 days supply | Qty: 90 | Fill #0 | Status: AC

## 2021-04-19 ENCOUNTER — Other Ambulatory Visit: Payer: Self-pay | Admitting: Podiatry

## 2021-04-19 ENCOUNTER — Other Ambulatory Visit (HOSPITAL_COMMUNITY): Payer: Self-pay

## 2021-04-19 MED ORDER — HYDROCHLOROTHIAZIDE 25 MG PO TABS
12.5000 mg | ORAL_TABLET | Freq: Every day | ORAL | 1 refills | Status: DC
  Filled 2021-04-19: qty 45, 90d supply, fill #0

## 2021-04-19 NOTE — Telephone Encounter (Signed)
Please advise 

## 2021-04-20 ENCOUNTER — Other Ambulatory Visit (HOSPITAL_COMMUNITY): Payer: Self-pay

## 2021-04-20 MED ORDER — NAPROXEN 500 MG PO TABS
500.0000 mg | ORAL_TABLET | Freq: Two times a day (BID) | ORAL | 1 refills | Status: DC
  Filled 2021-04-20: qty 60, 30d supply, fill #0
  Filled 2021-05-29: qty 60, 30d supply, fill #1

## 2021-04-21 ENCOUNTER — Other Ambulatory Visit (HOSPITAL_COMMUNITY): Payer: Self-pay

## 2021-05-17 ENCOUNTER — Other Ambulatory Visit (HOSPITAL_COMMUNITY): Payer: Self-pay

## 2021-05-17 MED ORDER — ALFUZOSIN HCL ER 10 MG PO TB24
10.0000 mg | ORAL_TABLET | Freq: Every day | ORAL | 4 refills | Status: DC
  Filled 2021-05-17: qty 90, 90d supply, fill #0
  Filled 2021-08-21: qty 90, 90d supply, fill #1
  Filled 2021-11-13: qty 90, 90d supply, fill #2
  Filled 2022-02-14: qty 90, 90d supply, fill #3
  Filled 2022-05-14: qty 90, 90d supply, fill #4

## 2021-05-29 ENCOUNTER — Other Ambulatory Visit (HOSPITAL_COMMUNITY): Payer: Self-pay

## 2021-06-16 ENCOUNTER — Inpatient Hospital Stay (HOSPITAL_COMMUNITY): Payer: 59

## 2021-06-16 ENCOUNTER — Encounter (HOSPITAL_COMMUNITY): Payer: Self-pay

## 2021-06-16 ENCOUNTER — Other Ambulatory Visit: Payer: Self-pay

## 2021-06-16 ENCOUNTER — Emergency Department (HOSPITAL_COMMUNITY): Payer: 59

## 2021-06-16 ENCOUNTER — Inpatient Hospital Stay (HOSPITAL_COMMUNITY)
Admission: EM | Admit: 2021-06-16 | Discharge: 2021-06-21 | DRG: 281 | Disposition: A | Discharge status: Home / Self Care | Payer: 59 | Attending: Family Medicine | Admitting: Family Medicine

## 2021-06-16 DIAGNOSIS — R072 Precordial pain: Secondary | ICD-10-CM | POA: Diagnosis not present

## 2021-06-16 DIAGNOSIS — I4729 Other ventricular tachycardia: Secondary | ICD-10-CM

## 2021-06-16 DIAGNOSIS — I208 Other forms of angina pectoris: Secondary | ICD-10-CM

## 2021-06-16 DIAGNOSIS — I493 Ventricular premature depolarization: Secondary | ICD-10-CM | POA: Diagnosis not present

## 2021-06-16 DIAGNOSIS — I959 Hypotension, unspecified: Secondary | ICD-10-CM | POA: Diagnosis present

## 2021-06-16 DIAGNOSIS — Z800 Family history of malignant neoplasm of digestive organs: Secondary | ICD-10-CM | POA: Diagnosis not present

## 2021-06-16 DIAGNOSIS — N1831 Chronic kidney disease, stage 3a: Secondary | ICD-10-CM | POA: Diagnosis present

## 2021-06-16 DIAGNOSIS — Z833 Family history of diabetes mellitus: Secondary | ICD-10-CM

## 2021-06-16 DIAGNOSIS — I253 Aneurysm of heart: Secondary | ICD-10-CM | POA: Diagnosis present

## 2021-06-16 DIAGNOSIS — Z20822 Contact with and (suspected) exposure to covid-19: Secondary | ICD-10-CM | POA: Diagnosis present

## 2021-06-16 DIAGNOSIS — Z808 Family history of malignant neoplasm of other organs or systems: Secondary | ICD-10-CM | POA: Diagnosis not present

## 2021-06-16 DIAGNOSIS — I472 Ventricular tachycardia: Secondary | ICD-10-CM | POA: Diagnosis not present

## 2021-06-16 DIAGNOSIS — Z8249 Family history of ischemic heart disease and other diseases of the circulatory system: Secondary | ICD-10-CM

## 2021-06-16 DIAGNOSIS — R079 Chest pain, unspecified: Secondary | ICD-10-CM | POA: Diagnosis not present

## 2021-06-16 DIAGNOSIS — M170 Bilateral primary osteoarthritis of knee: Secondary | ICD-10-CM | POA: Diagnosis present

## 2021-06-16 DIAGNOSIS — R0789 Other chest pain: Secondary | ICD-10-CM | POA: Diagnosis not present

## 2021-06-16 DIAGNOSIS — I252 Old myocardial infarction: Secondary | ICD-10-CM | POA: Diagnosis not present

## 2021-06-16 DIAGNOSIS — Z951 Presence of aortocoronary bypass graft: Secondary | ICD-10-CM

## 2021-06-16 DIAGNOSIS — I739 Peripheral vascular disease, unspecified: Secondary | ICD-10-CM | POA: Diagnosis present

## 2021-06-16 DIAGNOSIS — I130 Hypertensive heart and chronic kidney disease with heart failure and stage 1 through stage 4 chronic kidney disease, or unspecified chronic kidney disease: Secondary | ICD-10-CM | POA: Diagnosis present

## 2021-06-16 DIAGNOSIS — N400 Enlarged prostate without lower urinary tract symptoms: Secondary | ICD-10-CM | POA: Diagnosis not present

## 2021-06-16 DIAGNOSIS — E7800 Pure hypercholesterolemia, unspecified: Secondary | ICD-10-CM | POA: Diagnosis present

## 2021-06-16 DIAGNOSIS — R42 Dizziness and giddiness: Secondary | ICD-10-CM | POA: Diagnosis not present

## 2021-06-16 DIAGNOSIS — R001 Bradycardia, unspecified: Secondary | ICD-10-CM

## 2021-06-16 DIAGNOSIS — I25118 Atherosclerotic heart disease of native coronary artery with other forms of angina pectoris: Secondary | ICD-10-CM | POA: Diagnosis present

## 2021-06-16 DIAGNOSIS — K219 Gastro-esophageal reflux disease without esophagitis: Secondary | ICD-10-CM | POA: Diagnosis present

## 2021-06-16 DIAGNOSIS — I491 Atrial premature depolarization: Secondary | ICD-10-CM | POA: Diagnosis not present

## 2021-06-16 DIAGNOSIS — I209 Angina pectoris, unspecified: Secondary | ICD-10-CM

## 2021-06-16 DIAGNOSIS — Z841 Family history of disorders of kidney and ureter: Secondary | ICD-10-CM

## 2021-06-16 DIAGNOSIS — I5043 Acute on chronic combined systolic (congestive) and diastolic (congestive) heart failure: Secondary | ICD-10-CM | POA: Diagnosis not present

## 2021-06-16 DIAGNOSIS — Z86010 Personal history of colonic polyps: Secondary | ICD-10-CM

## 2021-06-16 DIAGNOSIS — B1920 Unspecified viral hepatitis C without hepatic coma: Secondary | ICD-10-CM | POA: Diagnosis present

## 2021-06-16 DIAGNOSIS — I5082 Biventricular heart failure: Secondary | ICD-10-CM | POA: Diagnosis present

## 2021-06-16 DIAGNOSIS — Z87891 Personal history of nicotine dependence: Secondary | ICD-10-CM

## 2021-06-16 DIAGNOSIS — Z79899 Other long term (current) drug therapy: Secondary | ICD-10-CM

## 2021-06-16 DIAGNOSIS — I2510 Atherosclerotic heart disease of native coronary artery without angina pectoris: Secondary | ICD-10-CM | POA: Diagnosis not present

## 2021-06-16 DIAGNOSIS — I25810 Atherosclerosis of coronary artery bypass graft(s) without angina pectoris: Secondary | ICD-10-CM | POA: Diagnosis not present

## 2021-06-16 DIAGNOSIS — Z7982 Long term (current) use of aspirin: Secondary | ICD-10-CM

## 2021-06-16 DIAGNOSIS — R0902 Hypoxemia: Secondary | ICD-10-CM | POA: Diagnosis not present

## 2021-06-16 DIAGNOSIS — I214 Non-ST elevation (NSTEMI) myocardial infarction: Principal | ICD-10-CM | POA: Diagnosis present

## 2021-06-16 DIAGNOSIS — I5022 Chronic systolic (congestive) heart failure: Secondary | ICD-10-CM | POA: Diagnosis present

## 2021-06-16 DIAGNOSIS — R008 Other abnormalities of heart beat: Secondary | ICD-10-CM | POA: Diagnosis present

## 2021-06-16 DIAGNOSIS — E785 Hyperlipidemia, unspecified: Secondary | ICD-10-CM | POA: Diagnosis present

## 2021-06-16 LAB — ECHOCARDIOGRAM COMPLETE
Height: 71 in
S' Lateral: 4 cm
Single Plane A4C EF: 59 %
Weight: 2625.6 oz

## 2021-06-16 LAB — CBC WITH DIFFERENTIAL/PLATELET
Abs Immature Granulocytes: 0.02 10*3/uL (ref 0.00–0.07)
Basophils Absolute: 0 10*3/uL (ref 0.0–0.1)
Basophils Relative: 0 %
Eosinophils Absolute: 0.1 10*3/uL (ref 0.0–0.5)
Eosinophils Relative: 1 %
HCT: 37.9 % — ABNORMAL LOW (ref 39.0–52.0)
Hemoglobin: 12.7 g/dL — ABNORMAL LOW (ref 13.0–17.0)
Immature Granulocytes: 0 %
Lymphocytes Relative: 18 %
Lymphs Abs: 1.2 10*3/uL (ref 0.7–4.0)
MCH: 28.3 pg (ref 26.0–34.0)
MCHC: 33.5 g/dL (ref 30.0–36.0)
MCV: 84.4 fL (ref 80.0–100.0)
Monocytes Absolute: 0.6 10*3/uL (ref 0.1–1.0)
Monocytes Relative: 9 %
Neutro Abs: 4.5 10*3/uL (ref 1.7–7.7)
Neutrophils Relative %: 72 %
Platelets: 168 10*3/uL (ref 150–400)
RBC: 4.49 MIL/uL (ref 4.22–5.81)
RDW: 14 % (ref 11.5–15.5)
WBC: 6.4 10*3/uL (ref 4.0–10.5)
nRBC: 0 % (ref 0.0–0.2)

## 2021-06-16 LAB — PHOSPHORUS: Phosphorus: 2.8 mg/dL (ref 2.5–4.6)

## 2021-06-16 LAB — TROPONIN I (HIGH SENSITIVITY)
Troponin I (High Sensitivity): 12 ng/L (ref <18)
Troponin I (High Sensitivity): 47 ng/L — ABNORMAL HIGH (ref <18)
Troponin I (High Sensitivity): 292 ng/L (ref <18)

## 2021-06-16 LAB — BASIC METABOLIC PANEL
Anion gap: 8 (ref 5–15)
BUN: 17 mg/dL (ref 8–23)
CO2: 22 mmol/L (ref 22–32)
Calcium: 8.8 mg/dL — ABNORMAL LOW (ref 8.9–10.3)
Chloride: 106 mmol/L (ref 98–111)
Creatinine, Ser: 1.32 mg/dL — ABNORMAL HIGH (ref 0.61–1.24)
GFR, Estimated: 60 mL/min — ABNORMAL LOW (ref >60)
Glucose, Bld: 104 mg/dL — ABNORMAL HIGH (ref 70–99)
Potassium: 4.3 mmol/L (ref 3.5–5.1)
Sodium: 136 mmol/L (ref 135–145)

## 2021-06-16 LAB — RESP PANEL BY RT-PCR (FLU A&B, COVID) ARPGX2
Influenza A by PCR: NEGATIVE
Influenza B by PCR: NEGATIVE
SARS Coronavirus 2 by RT PCR: NEGATIVE

## 2021-06-16 LAB — HIV ANTIBODY (ROUTINE TESTING W REFLEX): HIV Screen 4th Generation wRfx: NONREACTIVE

## 2021-06-16 MED ORDER — ASPIRIN EC 81 MG PO TBEC
81.0000 mg | DELAYED_RELEASE_TABLET | Freq: Every day | ORAL | Status: DC
  Administered 2021-06-17 – 2021-06-18 (×2): 81 mg via ORAL
  Filled 2021-06-16 (×2): qty 1

## 2021-06-16 MED ORDER — SIMVASTATIN 20 MG PO TABS
40.0000 mg | ORAL_TABLET | Freq: Every evening | ORAL | Status: DC
  Administered 2021-06-16: 40 mg via ORAL
  Filled 2021-06-16: qty 2

## 2021-06-16 MED ORDER — ASPIRIN EC 81 MG PO TBEC: 81.0000 mg | DELAYED_RELEASE_TABLET | Freq: Every day | ORAL | Status: DC

## 2021-06-16 MED ORDER — ASPIRIN 81 MG PO CHEW
324.0000 mg | CHEWABLE_TABLET | Freq: Once | ORAL | Status: DC
  Filled 2021-06-16: qty 4

## 2021-06-16 MED ORDER — METOPROLOL TARTRATE 12.5 MG HALF TABLET
12.5000 mg | ORAL_TABLET | Freq: Two times a day (BID) | ORAL | Status: DC
  Administered 2021-06-16: 12.5 mg via ORAL
  Filled 2021-06-16: qty 1

## 2021-06-16 MED ORDER — ALFUZOSIN HCL ER 10 MG PO TB24
10.0000 mg | ORAL_TABLET | Freq: Every day | ORAL | Status: DC
  Administered 2021-06-16: 10 mg via ORAL
  Filled 2021-06-16 (×3): qty 1

## 2021-06-16 MED ORDER — ENOXAPARIN SODIUM 40 MG/0.4ML IJ SOSY
40.0000 mg | PREFILLED_SYRINGE | INTRAMUSCULAR | Status: DC
  Filled 2021-06-16: qty 0.4

## 2021-06-16 NOTE — ED Triage Notes (Signed)
Pt arrived via [facility] from work for c/o 5/10 non radiating midsternal chest pain that started at 0530 today. Per EMS pt told them he had the same chest pain yesterday at 0730 that lasted an hour then went away. EMS gave ASA 324 mg. Per EMS pt had bigeminy PVCs on monitor. Pt is A&Ox4.

## 2021-06-16 NOTE — Consult Note (Addendum)
Cardiology Consultation:   Patient ID: Nicholas [name] MRN: [id number]; DOB: 1956-01-08  Admit date: 06/16/2021 Date of Consult: 06/16/2021  PCP:  Alroy Dust, L.Marlou Sa, MD   [facility] Providers Cardiologist:  Marlou Porch '17  Patient Profile:   Nicholas [name] is a 65 y.o. male with a hx of CAD s/p CABG '03, HTN, HLD, presycope who is being seen 06/16/2021 for the evaluation of chest pain at the request of Dr. Almyra Free.  History of Present Illness:   Nicholas [name] is a 65 yo male PMH noted above. Underwent CABG in 2003 in [place], [place].  He was followed by Dr. Marlou Porch up until 2008 then was lost to follow-up until he was seen again in the ED in August 2015.  Presented at that time with dizziness and palpitations.  Cardiac enzymes were cycled and negative.  His heart rate was noted in the 40-50 range with PVCs.  He was set up for outpatient monitor and exercise Myoview.  Nuclear study showed prior infarct but no ischemia with preserved LV function.  Medical therapy was recommended.  He was not continued on beta-blocker therapy in the setting of bradycardia.  He was last seen in the office 05/2016. He has been resumed on HCTZ 12.'5mg'$  daily with improved edema previously reported in his hands. Otherwise doing well without episodes of presyncope.   Since that time he reports he has been doing well. Remains active, working on a daily basis. Follows with PCP and has been on his home medications. No issues up until yesterday. Says he was at work when he developed onset of dizziness, light-headedness, felt winded and had some chest discomfort. Symptoms abated and he felt well throughout the evening. Had a second episode this morning again while he was at work. Again noted dizziness, lightheadedness and felt weak. He did check his blood pressure during both episodes and noted systolic in the [id number] range. EMS was called. He was given '324mg'$  ASA en route with EMS and noted to be in bigeminy.   In the ED his labs showed  stable electrolytes, creatinine 1.3, high-sensitivity troponin 12>> 47, WBC 6.4, hemoglobin 12.7. CXR negative. EKG showed SB, bigeminy, TWI in inferolateral leads. At the time of assessment he denies any chest pain or dizziness. Review of telemetry shows mostly bigeminy, but also freq PVCs. HR ranging from 40-70s.  Past Medical History:  Diagnosis Date   Arthritis    knees, hands   Cataract    Coronary artery disease    Dysrhythmia 06/2014   sinus brady with PVC'S   GERD (gastroesophageal reflux disease)    Hepatitis C    Hx of adenomatous colonic polyps 09/21/2014   Hypercholesteremia    Hypertension    Myocardial infarction [facility])     Past Surgical History:  Procedure Laterality Date   CARDIAC SURGERY     triple bypass   COLONOSCOPY     CORONARY ARTERY BYPASS GRAFT     CABG x 3 Vessels     Home Medications:  Prior to Admission medications   Medication Sig Start Date End Date Taking? Authorizing Provider  alfuzosin (UROXATRAL) 10 MG 24 hr tablet Take 1 tablet (10 mg total) by mouth daily. 05/17/21  Yes   aspirin EC 81 MG tablet Take 81 mg by mouth daily with breakfast.   Yes [provider]  hydrochlorothiazide (HYDRODIURIL) 25 MG tablet TAKE 1/2 TABLET BY MOUTH DAILY. Patient taking differently: Take 12.5 mg by mouth daily. 07/02/17  Yes Jerline Pain, MD  ibuprofen (ADVIL,MOTRIN) 600 MG tablet Take 1 tablet (600 mg total) by mouth every 6 (six) hours as needed. 04/13/15  Yes Antonietta Breach, PA-C  naproxen (NAPROSYN) 500 MG tablet Take 1 tablet (500 mg total) by mouth 2 (two) times daily with a meal. 04/20/21  Yes Regal, Tamala Fothergill, DPM  nitroGLYCERIN (NITROSTAT) 0.4 MG SL tablet Place 1 tablet (0.4 mg total) under the tongue every 5 (five) minutes x 3 doses as needed for chest pain. 05/22/16  Yes Jerline Pain, MD  simvastatin (ZOCOR) 40 MG tablet Take 1 tablet (40 mg total) by mouth every evening. 03/22/20  Yes Mitchell, L.Marlou Sa, MD  diclofenac (VOLTAREN) 75 MG EC tablet Take  1 tablet (75 mg total) by mouth 2 (two) times daily. Patient not taking: Reported on 06/16/2021 06/22/19   Wallene Huh, DPM  hydrochlorothiazide (HYDRODIURIL) 25 MG tablet TAKE 1/2 TABLET BY MOUTH ONCE DAILY Patient not taking: Reported on 06/16/2021 01/05/21 01/05/22  Alroy Dust, L.Marlou Sa, MD  hydrochlorothiazide (HYDRODIURIL) 25 MG tablet Take 0.5 tablets (12.5 mg total) by mouth daily. Patient not taking: Reported on 06/16/2021 04/19/21     pantoprazole (PROTONIX) 40 MG tablet Take 1 tablet (40 mg total) by mouth daily as needed. Patient not taking: Reported on 06/16/2021 03/13/21     predniSONE (DELTASONE) 10 MG tablet TAKE 2 TABLETS (20 MG TOTAL) BY MOUTH DAILY FOR 4 DAYS. Patient not taking: Reported on 06/16/2021 01/06/21 01/06/22  Raspet, Derry Skill, PA-C  simvastatin (ZOCOR) 40 MG tablet TAKE 1 TABLET (40 MG TOTAL) BY MOUTH EVERY EVENING. Patient not taking: Reported on 06/16/2021 07/02/17   Jerline Pain, MD    Inpatient Medications: Scheduled Meds:  alfuzosin  10 mg Oral Daily   aspirin EC  81 mg Oral Daily   enoxaparin (LOVENOX) injection  40 mg Subcutaneous Q24H   simvastatin  40 mg Oral QPM   Continuous Infusions:  sodium chloride     PRN Meds:   Allergies:   No Known Allergies  Social History:   Social History   Socioeconomic History   Marital status: Married    Spouse name: Not on file   Number of children: Not on file   Years of education: Not on file   Highest education level: Not on file  Occupational History   Not on file  Tobacco Use   Smoking status: Former    Types: Cigarettes    Quit date: 06/10/2002    Years since quitting: 19.0   Smokeless tobacco: Never  Vaping Use   Vaping Use: Never used  Substance and Sexual Activity   Alcohol use: Yes    Alcohol/week: 7.0 standard drinks    Types: 7 Cans of beer per week   Drug use: Yes    Types: Marijuana    Comment: occ   Sexual activity: Yes  Other Topics Concern   Not on file  Social History Narrative   Not on file    Social Determinants of Health   Financial Resource Strain: Not on file  Food Insecurity: Not on file  Transportation Needs: Not on file  Physical Activity: Not on file  Stress: Not on file  Social Connections: Not on file  Intimate Partner Violence: Not on file    Family History:    Family History  Problem Relation Age of Onset   Diabetic kidney disease Mother    Kidney disease Mother    Throat cancer Mother    Cancer Mother    Diabetes Mother  Hypertension Mother    Esophageal cancer Mother    Other Father        unknown   Hypertension Father    Heart attack Neg Hx    Colon cancer Neg Hx    Stomach cancer Neg Hx    Rectal cancer Neg Hx    Colon polyps Neg Hx      ROS:  Please see the history of present illness.   All other ROS reviewed and negative.     Physical Exam/Data:   Vitals:   06/16/21 1230 06/16/21 1300 06/16/21 1330 06/16/21 1400  BP: 121/69 122/73 123/79 (!) 153/75  Pulse: (!) 32 63 65 63  Resp: 19 (!) '22 20 16  '$ Temp:      TempSrc:      SpO2: 100% 100% 100% 99%  Weight:      Height:       No intake or output data in the 24 hours ending 06/16/21 1459 Last 3 Weights 06/16/2021 02/21/2018 02/07/2018  Weight (lbs) 167 lb 1.7 oz 167 lb 167 lb 12.8 oz  Weight (kg) 75.8 kg 75.751 kg 76.114 kg     Body mass index is 23.31 kg/m.  General:  Well nourished, well developed, in no acute distress HEENT: normal Lymph: no adenopathy Neck: no JVD Endocrine:  No thryomegaly Vascular: No carotid bruits; FA pulses 2+ bilaterally without bruits  Cardiac:  normal S1, S2; RRR; no murmur  Lungs:  clear to auscultation bilaterally, no wheezing, rhonchi or rales  Abd: soft, nontender, no hepatomegaly  Ext: no edema Musculoskeletal:  No deformities, BUE and BLE strength normal and equal Skin: warm and dry  Neuro:  CNs 2-12 intact, no focal abnormalities noted Psych:  Normal affect   EKG:  The EKG was personally reviewed and demonstrates:  SB, bigeminy   Telemetry:  Telemetry was personally reviewed and demonstrates:  SB-->SR mostly bigeminy, freq PVCs  Relevant CV Studies:  Echo: 06/2014  Study Conclusions   - Left ventricle: The cavity size was normal. Wall thickness was    normal. Systolic function was normal. The estimated ejection    fraction was in the range of 50% to 55%. There is akinesis of the    basalinferior myocardium.  - Mitral valve: There was mild regurgitation.  - Left atrium: The atrium was mildly dilated.   Impressions:   - Frequent PVC&'s noted.   Laboratory Data:  High Sensitivity Troponin:   Recent Labs  Lab 06/16/21 0800 06/16/21 1015  TROPONINIHS 12 47*     Chemistry Recent Labs  Lab 06/16/21 0800  NA 136  K 4.3  CL 106  CO2 22  GLUCOSE 104*  BUN 17  CREATININE 1.32*  CALCIUM 8.8*  GFRNONAA 60*  ANIONGAP 8    No results for input(s): PROT, ALBUMIN, AST, ALT, ALKPHOS, BILITOT in the last 168 hours. Hematology Recent Labs  Lab 06/16/21 0800  WBC 6.4  RBC 4.49  HGB 12.7*  HCT 37.9*  MCV 84.4  MCH 28.3  MCHC 33.5  RDW 14.0  PLT 168   BNPNo results for input(s): BNP, PROBNP in the last 168 hours.  DDimer No results for input(s): DDIMER in the last 168 hours.   Radiology/Studies:  DG Chest Port 1 View  Result Date: 06/16/2021 CLINICAL DATA:  Pain, midsternal EXAM: PORTABLE CHEST 1 VIEW COMPARISON:  Chest radiograph 05/21/2014 FINDINGS: Median sternotomy wires and mediastinal surgical material is again seen. The superior most wire is fractured, unchanged. The cardiomediastinal silhouette is normal.  There is no focal consolidation or pulmonary edema. There is no pleural effusion or pneumothorax. There is no acute osseous abnormality. IMPRESSION: No radiographic evidence of acute cardiopulmonary process. Electronically Signed   By: Nicholas [name] M.D.   On: 06/16/2021 08:20     Assessment and Plan:   CAMAR EVERINGHAM is a 65 y.o. male with a hx of CAD s/p CABG '03, HTN, HLD, presycope  who is being seen 06/16/2021 for the evaluation of chest pain at the request of Dr. Almyra Free.  Chest discomfort with hx CAD s/p CABG '03: symptoms are somewhat atypical and he struggled to describe the chest "discomfort", stating "just didn't feel right". Troponin 12>>47. EKG showed SB, bigeminy known TWI in inferolateral leads. No chest pain since arriving to the ED. Given known CAD may further evaluation pending CEs. -- cycle troponins -- check echo -- continue ASA, statin  Bradycardia/Bigeminy: K+ 4.3. Known to have PVCs in the past, but as above not tolerated BB therapy with bradycardia. Likely that HR is not accurate in the setting of bigeminy/PVCs.  -- no BB PTA, reports not tolerated in the past. Can re-trial to see if this helps suppress PVCs. -- will check mag for completeness  -- check echo  HTN: blood pressures are stable in the ED -- PTA meds HCTZ 12.'5mg'$  daily, would hold for now and trial low dose metoprolol 12.'5mg'$  BID  HLD: Zocor '40mg'$  daily PTA -- check lipids   Risk Assessment/Risk Scores:   HEAR Score (for undifferentiated chest pain):  HEAR Score: 4{   For questions or updates, please contact [place] Please consult [email or web address] for contact info under    Signed, Reino Bellis, NP  06/16/2021 2:59 PM As above, patient seen and examined.  Briefly he is a 65 year old male with past medical history of coronary artery disease status post coronary artery bypass graft, hypertension, hyperlipidemia for evaluation of PVCs and dizziness.  Patient typically has some dyspnea on exertion which is chronic.  No orthopnea, PND, pedal edema, exertional chest pain or syncope.  Yesterday morning at work he developed fatigue and some dizziness.  His symptoms improved and he continued his day.  He had recurrent symptoms today and presented for further evaluation.  Again he denies chest pain, dyspnea or syncope.  In the emergency room he is noted to have frequent PVCs.  Potassium 4.3,  creatinine 1.32, hemoglobin 12.7, initial troponin 12 with follow-up 47. Electrocardiogram shows sinus rhythm with ventricular bigeminy, inferolateral T wave inversion.  Personally reviewed.  1 dizziness-patient was felt to be bradycardic in the emergency room but likely under counting PVCs.  This may be contributing to his symptoms.  We will admit to telemetry.  Check echocardiogram for LV function.  I would like to suppress his PVCs to see if his symptoms will improve.  Add metoprolol 12.5 mg twice daily and follow.  He apparently has had difficulties with bradycardia in the past so will observe closely.  Note he does not have chest pain.  2 minimally elevated troponin-patient denies chest pain.  We will continue to cycle.  3 hypertension-discontinue hydrochlorothiazide and instead will try to treat with low-dose metoprolol.  4 coronary artery disease-continue aspirin and statin.  5 hyperlipidemia-continue statin.  Kirk Ruths, MD

## 2021-06-16 NOTE — H&P (Signed)
History and Physical    Nicholas [name] [id number] DOB: 1955/12/13 DOA: 06/16/2021  PCP: Alroy Dust, L.Marlou Sa, MD (Confirm with patient/family/NH records and if not entered, this has to be entered at [facility] point of entry) Patient coming from: Home  I have personally briefly reviewed patient's old medical records in [place]  Chief Complaint: Chest pain  HPI: Nicholas [name] is a 65 y.o. male with medical history significant of CAD with CABG x3, HTN, HLD, sinus bradycardia with PVCs, came with new onset chest pains.  Total of two episodes.  First episode happened yesterday morning, patient was at rest, pain in centrally located, associated with light-headed, whole episode lasted 20 minutes.  Patient this morning around 500 patient had another episode, again feels light headed.  ED Course: No more chest pains, HR varies from 46-60s, BP stable. EKG showed bigeminy's, but no acute ST changes.  Troponins 12> 47.  Review of Systems: As per HPI otherwise 14 point review of systems negative.    Past Medical History:  Diagnosis Date   Arthritis    knees, hands   Cataract    Coronary artery disease    Dysrhythmia 06/2014   sinus brady with PVC'S   GERD (gastroesophageal reflux disease)    Hepatitis C    Hx of adenomatous colonic polyps 09/21/2014   Hypercholesteremia    Hypertension    Myocardial infarction [facility])     Past Surgical History:  Procedure Laterality Date   CARDIAC SURGERY     triple bypass   COLONOSCOPY     CORONARY ARTERY BYPASS GRAFT     CABG x 3 Vessels     reports that he quit smoking about 19 years ago. His smoking use included cigarettes. He has never used smokeless tobacco. He reports current alcohol use of about 7.0 standard drinks per week. He reports current drug use. Drug: Marijuana.  No Known Allergies  Family History  Problem Relation Age of Onset   Diabetic kidney disease Mother    Kidney disease Mother    Throat cancer Mother    Cancer Mother     Diabetes Mother    Hypertension Mother    Esophageal cancer Mother    Other Father        unknown   Hypertension Father    Heart attack Neg Hx    Colon cancer Neg Hx    Stomach cancer Neg Hx    Rectal cancer Neg Hx    Colon polyps Neg Hx      Prior to Admission medications   Medication Sig Start Date End Date Taking? Authorizing Provider  alfuzosin (UROXATRAL) 10 MG 24 hr tablet Take 1 tablet (10 mg total) by mouth daily. 05/17/21  Yes   aspirin EC 81 MG tablet Take 81 mg by mouth daily with breakfast.   Yes [provider]  hydrochlorothiazide (HYDRODIURIL) 25 MG tablet TAKE 1/2 TABLET BY MOUTH DAILY. Patient taking differently: Take 12.5 mg by mouth daily. 07/02/17  Yes Jerline Pain, MD  ibuprofen (ADVIL,MOTRIN) 600 MG tablet Take 1 tablet (600 mg total) by mouth every 6 (six) hours as needed. 04/13/15  Yes Antonietta Breach, PA-C  naproxen (NAPROSYN) 500 MG tablet Take 1 tablet (500 mg total) by mouth 2 (two) times daily with a meal. 04/20/21  Yes Regal, Tamala Fothergill, DPM  nitroGLYCERIN (NITROSTAT) 0.4 MG SL tablet Place 1 tablet (0.4 mg total) under the tongue every 5 (five) minutes x 3 doses as needed for chest pain.  05/22/16  Yes Jerline Pain, MD  simvastatin (ZOCOR) 40 MG tablet Take 1 tablet (40 mg total) by mouth every evening. 03/22/20  Yes Mitchell, L.Marlou Sa, MD  diclofenac (VOLTAREN) 75 MG EC tablet Take 1 tablet (75 mg total) by mouth 2 (two) times daily. Patient not taking: Reported on 06/16/2021 06/22/19   Wallene Huh, DPM  hydrochlorothiazide (HYDRODIURIL) 25 MG tablet TAKE 1/2 TABLET BY MOUTH ONCE DAILY Patient not taking: Reported on 06/16/2021 01/05/21 01/05/22  Alroy Dust, L.Marlou Sa, MD  hydrochlorothiazide (HYDRODIURIL) 25 MG tablet Take 0.5 tablets (12.5 mg total) by mouth daily. Patient not taking: Reported on 06/16/2021 04/19/21     pantoprazole (PROTONIX) 40 MG tablet Take 1 tablet (40 mg total) by mouth daily as needed. Patient not taking: Reported on 06/16/2021 03/13/21      predniSONE (DELTASONE) 10 MG tablet TAKE 2 TABLETS (20 MG TOTAL) BY MOUTH DAILY FOR 4 DAYS. Patient not taking: Reported on 06/16/2021 01/06/21 01/06/22  Raspet, Derry Skill, PA-C  simvastatin (ZOCOR) 40 MG tablet TAKE 1 TABLET (40 MG TOTAL) BY MOUTH EVERY EVENING. Patient not taking: Reported on 06/16/2021 07/02/17   Jerline Pain, MD    Physical Exam: Vitals:   06/16/21 1030 06/16/21 1100 06/16/21 1200 06/16/21 1230  BP: 121/76 (!) 129/92 119/69 121/69  Pulse: (!) 53 (!) 50 (!) 50 (!) 32  Resp: (!) '21 18 17 19  '$ Temp:      TempSrc:      SpO2: 100% 100% 100% 100%  Weight:      Height:        Constitutional: NAD, calm, comfortable Vitals:   06/16/21 1030 06/16/21 1100 06/16/21 1200 06/16/21 1230  BP: 121/76 (!) 129/92 119/69 121/69  Pulse: (!) 53 (!) 50 (!) 50 (!) 32  Resp: (!) '21 18 17 19  '$ Temp:      TempSrc:      SpO2: 100% 100% 100% 100%  Weight:      Height:       Eyes: PERRL, lids and conjunctivae normal ENMT: Mucous membranes are moist. Posterior pharynx clear of any exudate or lesions.Normal dentition.  Neck: normal, supple, no masses, no thyromegaly Respiratory: clear to auscultation bilaterally, no wheezing, no crackles. Normal respiratory effort. No accessory muscle use.  Cardiovascular: Regular rate and rhythm, no murmurs / rubs / gallops. No extremity edema. 2+ pedal pulses. No carotid bruits.  Abdomen: no tenderness, no masses palpated. No hepatosplenomegaly. Bowel sounds positive.  Musculoskeletal: no clubbing / cyanosis. No joint deformity upper and lower extremities. Good ROM, no contractures. Normal muscle tone.  Skin: no rashes, lesions, ulcers. No induration Neurologic: CN 2-12 grossly intact. Sensation intact, DTR normal. Strength 5/5 in all 4.  Psychiatric: Normal judgment and insight. Alert and oriented x 3. Normal mood.     Labs on Admission: I have personally reviewed following labs and imaging studies  CBC: Recent Labs  Lab 06/16/21 0800  WBC 6.4   NEUTROABS 4.5  HGB 12.7*  HCT 37.9*  MCV 84.4  PLT [id number]   Basic Metabolic Panel: Recent Labs  Lab 06/16/21 0800  NA 136  K 4.3  CL 106  CO2 22  GLUCOSE 104*  BUN 17  CREATININE 1.32*  CALCIUM 8.8*   GFR: Estimated Creatinine Clearance: 59.4 mL/min (A) (by C-G formula based on SCr of 1.32 mg/dL (H)). Liver Function Tests: No results for input(s): AST, ALT, ALKPHOS, BILITOT, PROT, ALBUMIN in the last 168 hours. No results for input(s): LIPASE, AMYLASE in the last 168  hours. No results for input(s): AMMONIA in the last 168 hours. Coagulation Profile: No results for input(s): INR, PROTIME in the last 168 hours. Cardiac Enzymes: No results for input(s): CKTOTAL, CKMB, CKMBINDEX, TROPONINI in the last 168 hours. BNP (last 3 results) No results for input(s): PROBNP in the last 8760 hours. HbA1C: No results for input(s): HGBA1C in the last 72 hours. CBG: No results for input(s): GLUCAP in the last 168 hours. Lipid Profile: No results for input(s): CHOL, HDL, LDLCALC, TRIG, CHOLHDL, LDLDIRECT in the last 72 hours. Thyroid Function Tests: No results for input(s): TSH, T4TOTAL, FREET4, T3FREE, THYROIDAB in the last 72 hours. Anemia Panel: No results for input(s): VITAMINB12, FOLATE, FERRITIN, TIBC, IRON, RETICCTPCT in the last 72 hours. Urine analysis: No results found for: COLORURINE, APPEARANCEUR, LABSPEC, [place], GLUCOSEU, [place], BILIRUBINUR, [place], [place], UROBILINOGEN, NITRITE, LEUKOCYTESUR  Radiological Exams on Admission: DG Chest Port 1 View  Result Date: 06/16/2021 CLINICAL DATA:  Pain, midsternal EXAM: PORTABLE CHEST 1 VIEW COMPARISON:  Chest radiograph 05/21/2014 FINDINGS: Median sternotomy wires and mediastinal surgical material is again seen. The superior most wire is fractured, unchanged. The cardiomediastinal silhouette is normal. There is no focal consolidation or pulmonary edema. There is no pleural effusion or pneumothorax. There is no acute osseous  abnormality. IMPRESSION: No radiographic evidence of acute cardiopulmonary process. Electronically Signed   By: Valetta Mole M.D.   On: 06/16/2021 08:20    EKG: Independently reviewed.  Bigeminy's, sinus bradycardia, no acute ST changes.  Assessment/Plan Active Problems:   Angina pectoris (HCC)   Angina at rest [facility])  (please populate well all problems here in Problem List. (For example, if patient is on BP meds at home and you resume or decide to hold them, it is a problem that needs to be her. Same for CAD, COPD, HLD and so on)  Angina -Suspect unstable angina -Cardiology consulted -Send a third set troponins, cardiology to decide further work-up plans -Continue statin and aspirin  Bigeminy's -Hold beta-blocker now for patient also has borderline hypotension  HLD -Continue statin  DVT prophylaxis: Lovenox Code Status: Full code Family Communication: Wife at bedside Disposition Plan: Expect more than 2 midnight hospital stay, expect further cardiac work-up Consults called: Cardiology Admission status: Telemetry admission   Lequita Halt MD [facility] Pager [phone]  06/16/2021, 2:01 PM

## 2021-06-16 NOTE — ED Provider Notes (Signed)
[facility] EMERGENCY DEPARTMENT Provider Note   CSN: [id number] Arrival date & time: 06/16/21  0741     History Chief Complaint  Patient presents with   Chest Pain    Nicholas [name] is a 65 y.o. male.  Patient presents ER chief complaint of chest pain.  Describes as a mid chest tightening sensation that started this morning and lasted about 2 hours before resolving.  Currently denies any chest pain.  Thinks he may have had some chest discomfort yesterday, but states it really was not much pain.  Pain did not anywhere else.  No associated fevers or cough no vomiting or diarrhea no diaphoresis or shortness of breath reported.   HPI: A 65 year old patient with a history of peripheral artery disease, hypertension and hypercholesterolemia presents for evaluation of chest pain. Initial onset of pain was approximately 3-6 hours ago. The patient's chest pain is not worse with exertion. The patient's chest pain is not middle- or left-sided, is not well-localized, is not described as heaviness/pressure/tightness, is not sharp and does not radiate to the arms/jaw/neck. The patient does not complain of nausea and denies diaphoresis. The patient has no history of stroke, has not smoked in the past 90 days, denies any history of treated diabetes, has no relevant family history of coronary artery disease (first degree relative at less than age 37) and does not have an elevated BMI (>=30).   Past Medical History:  Diagnosis Date   Arthritis    knees, hands   Cataract    Coronary artery disease    Dysrhythmia 06/2014   sinus brady with PVC'S   GERD (gastroesophageal reflux disease)    Hepatitis C    Hx of adenomatous colonic polyps 09/21/2014   Hypercholesteremia    Hypertension    Myocardial infarction [facility])     Patient Active Problem List   Diagnosis Date Noted   NSVT (nonsustained ventricular tachycardia) ([place]) 06/18/2021   NSTEMI (non-ST elevated myocardial infarction)  ([place]) 06/16/2021   Angina at rest ([place]) 06/16/2021   Liver fibrosis 12/19/2015   Chronic hepatitis C without hepatic coma ([place]) 04/13/2015   Hx of adenomatous colonic polyps 09/21/2014   CAD- CABG x 3 in [place] '03 05/21/2014   Dizziness when bending over 05/21/2014   Frequent PVCs 05/21/2014   Sinus bradycardia 05/21/2014   Chronic renal insufficiency, stage II (mild) 05/21/2014   Dyslipidemia 05/21/2014   Near syncope 05/21/2014    Past Surgical History:  Procedure Laterality Date   CARDIAC SURGERY     triple bypass   COLONOSCOPY     CORONARY ARTERY BYPASS GRAFT     CABG x 3 Vessels   LEFT HEART CATH AND CORS/GRAFTS ANGIOGRAPHY N/A 06/19/2021   Procedure: LEFT HEART CATH AND CORS/GRAFTS ANGIOGRAPHY;  Surgeon: Belva Crome, MD;  Location: [facility];  Service: Cardiovascular;  Laterality: N/A;       Family History  Problem Relation Age of Onset   Diabetic kidney disease Mother    Kidney disease Mother    Throat cancer Mother    Cancer Mother    Diabetes Mother    Hypertension Mother    Esophageal cancer Mother    Other Father        unknown   Hypertension Father    Heart attack Neg Hx    Colon cancer Neg Hx    Stomach cancer Neg Hx    Rectal cancer Neg Hx    Colon polyps Neg Hx  Social History   Tobacco Use   Smoking status: Former    Types: Cigarettes    Quit date: 06/10/2002    Years since quitting: 19.0   Smokeless tobacco: Never  Vaping Use   Vaping Use: Never used  Substance Use Topics   Alcohol use: Yes    Alcohol/week: 7.0 standard drinks    Types: 7 Cans of beer per week   Drug use: Yes    Types: Marijuana    Comment: occ    Home Medications Prior to Admission medications   Medication Sig Start Date End Date Taking? Authorizing Provider  alfuzosin (UROXATRAL) 10 MG 24 hr tablet Take 1 tablet (10 mg total) by mouth daily. 05/17/21  Yes   aspirin EC 81 MG tablet Take 81 mg by mouth daily with breakfast.   Yes [provider]  nitroGLYCERIN (NITROSTAT) 0.4 MG SL tablet Place 1 tablet (0.4 mg total) under the tongue every 5 (five) minutes x 3 doses as needed for chest pain. 05/22/16  Yes Jerline Pain, MD  amiodarone (PACERONE) 200 MG tablet Take 1 tablet (200 mg total) by mouth 2 (two) times daily for 5 days. 06/21/21 06/26/21  Danford, Suann Larry, MD  amiodarone (PACERONE) 200 MG tablet Take 1 tablet (200 mg total) by mouth daily. 06/27/21   Danford, Suann Larry, MD  metoprolol tartrate (LOPRESSOR) 25 MG tablet Take 1 tablet (25 mg total) by mouth 2 (two) times daily. 06/21/21   Danford, Suann Larry, MD  rosuvastatin (CRESTOR) 40 MG tablet Take 1 tablet (40 mg total) by mouth daily at 6 PM. 06/21/21   Danford, Suann Larry, MD    Allergies    Patient has no known allergies.  Review of Systems   Review of Systems  Constitutional:  Negative for fever.  HENT:  Negative for ear pain and sore throat.   Eyes:  Negative for pain.  Respiratory:  Negative for cough.   Cardiovascular:  Positive for chest pain.  Gastrointestinal:  Negative for abdominal pain.  Genitourinary:  Negative for flank pain.  Musculoskeletal:  Negative for back pain.  Skin:  Negative for color change and rash.  Neurological:  Negative for syncope.  All other systems reviewed and are negative.  Physical Exam Updated Vital Signs BP 118/76 (BP Location: Left Arm)   Pulse (!) 55   Temp 98.3 F (36.8 C) (Oral)   Resp 18   Ht '5\' 11"'$  (1.803 m)   Wt 74.4 kg   SpO2 99%   BMI 22.89 kg/m   Physical Exam Constitutional:      Appearance: He is well-developed.  HENT:     Head: Normocephalic.     Nose: Nose normal.  Eyes:     Extraocular Movements: Extraocular movements intact.  Cardiovascular:     Rate and Rhythm: Normal rate.  Pulmonary:     Effort: Pulmonary effort is normal.  Skin:    Coloration: Skin is not jaundiced.  Neurological:     Mental Status: He is alert. Mental status is at baseline.    ED Results /  Procedures / Treatments   Labs (all labs ordered are listed, but only abnormal results are displayed) Labs Reviewed  CBC WITH DIFFERENTIAL/PLATELET - Abnormal; Notable for the following components:      Result Value   Hemoglobin 12.7 (*)    HCT 37.9 (*)    All other components within normal limits  BASIC METABOLIC PANEL - Abnormal; Notable for the following components:   Glucose,  Bld 104 (*)    Creatinine, Ser 1.32 (*)    Calcium 8.8 (*)    GFR, Estimated 60 (*)    All other components within normal limits  BASIC METABOLIC PANEL - Abnormal; Notable for the following components:   Creatinine, Ser 1.28 (*)    All other components within normal limits  HEPARIN LEVEL (UNFRACTIONATED) - Abnormal; Notable for the following components:   Heparin Unfractionated 0.21 (*)    All other components within normal limits  BASIC METABOLIC PANEL - Abnormal; Notable for the following components:   CO2 21 (*)    Creatinine, Ser 1.31 (*)    All other components within normal limits  CBC - Abnormal; Notable for the following components:   HCT 38.2 (*)    Platelets 138 (*)    All other components within normal limits  HEPARIN LEVEL (UNFRACTIONATED) - Abnormal; Notable for the following components:   Heparin Unfractionated 0.12 (*)    All other components within normal limits  CBC - Abnormal; Notable for the following components:   Hemoglobin 12.4 (*)    HCT 36.1 (*)    All other components within normal limits  BASIC METABOLIC PANEL - Abnormal; Notable for the following components:   Glucose, Bld 115 (*)    Creatinine, Ser 1.41 (*)    GFR, Estimated 55 (*)    All other components within normal limits  CBC - Abnormal; Notable for the following components:   Hemoglobin 12.2 (*)    HCT 35.4 (*)    Platelets 146 (*)    All other components within normal limits  BASIC METABOLIC PANEL - Abnormal; Notable for the following components:   Creatinine, Ser 1.33 (*)    GFR, Estimated 59 (*)    All  other components within normal limits  TROPONIN I (HIGH SENSITIVITY) - Abnormal; Notable for the following components:   Troponin I (High Sensitivity) 47 (*)    All other components within normal limits  TROPONIN I (HIGH SENSITIVITY) - Abnormal; Notable for the following components:   Troponin I (High Sensitivity) 292 (*)    All other components within normal limits  TROPONIN I (HIGH SENSITIVITY) - Abnormal; Notable for the following components:   Troponin I (High Sensitivity) 136 (*)    All other components within normal limits  TROPONIN I (HIGH SENSITIVITY) - Abnormal; Notable for the following components:   Troponin I (High Sensitivity) 113 (*)    All other components within normal limits  RESP PANEL BY RT-PCR (FLU A&B, COVID) ARPGX2  HIV ANTIBODY (ROUTINE TESTING W REFLEX)  PHOSPHORUS  HEPARIN LEVEL (UNFRACTIONATED)  MAGNESIUM  HEPARIN LEVEL (UNFRACTIONATED)  HEPARIN LEVEL (UNFRACTIONATED)  MAGNESIUM  MAGNESIUM  TROPONIN I (HIGH SENSITIVITY)    EKG EKG Interpretation  Date/Time:  Friday June 16 2021 07:44:59 EDT Ventricular Rate:  90 PR Interval:  165 QRS Duration: 124 QT Interval:  429 QTC Calculation: 372 R Axis:   56 Text Interpretation: Sinus rhythm Paired ventricular premature complexes Biatrial enlargement Nonspecific intraventricular conduction delay Nonspecific T abnormalities, diffuse leads Confirmed by Thamas Jaegers ([id number]) on 06/16/2021 12:49:28 PM  Radiology No results found.  Procedures Procedures   Medications Ordered in ED Medications  amiodarone (NEXTERONE) 1.8 mg/mL load via infusion 150 mg (150 mg Intravenous Bolus from Bag 06/18/21 0846)    Followed by  amiodarone (NEXTERONE PREMIX) 360-4.14 MG/200ML-% (1.8 mg/mL) IV infusion (60 mg/hr Intravenous New Bag/Given 06/18/21 1203)  labetalol (NORMODYNE) injection 10 mg (has no administration in time range)  hydrALAZINE (APRESOLINE) injection 10 mg (has no administration in time range)  0.9 %  sodium  chloride infusion (0 mLs Intravenous Stopped 06/19/21 2056)  heparin bolus via infusion 4,000 Units (4,000 Units Intravenous Bolus from Bag 06/17/21 0843)  angioplasty book ( Does not apply Given 06/17/21 1823)  heparin bolus via infusion 2,000 Units (2,000 Units Intravenous Bolus from Bag 06/18/21 0309)  potassium chloride SA (KLOR-CON) CR tablet 40 mEq (40 mEq Oral Given 06/18/21 1428)  magnesium sulfate IVPB 2 g 50 mL (2 g Intravenous New Bag/Given 06/18/21 1435)  aspirin chewable tablet 81 mg (81 mg Oral Given 06/19/21 0638)  gadobutrol (GADAVIST) 1 MMOL/ML injection 7 mL (7 mLs Intravenous Contrast Given 06/21/21 0801)    ED Course  I have reviewed the triage vital signs and the nursing notes.  Pertinent labs & imaging results that were available during my care of the patient were reviewed by me and considered in my medical decision making (see chart for details).    MDM Rules/Calculators/A&P HEAR Score: 4                         Patient with extensive cardiac disease history of triple bypass presenting again with recurrent chest pain.  Currently symptom-free.  EKG shows sinus rhythm, multiple PVCs.  No ST elevations or depressions noted.  Second troponin appears elevated, patient admitted to hospitalist team.   Final Clinical Impression(s) / ED Diagnoses Final diagnoses:  NSTEMI (non-ST elevated myocardial infarction) [facility])    Rx / DC Orders ED Discharge Orders          Ordered    amiodarone (PACERONE) 200 MG tablet  Daily        06/21/21 1640    Amb Referral to Cardiac Rehabilitation        06/21/21 1139    amiodarone (PACERONE) 200 MG tablet  2 times daily        06/21/21 1640    metoprolol tartrate (LOPRESSOR) 25 MG tablet  2 times daily        06/21/21 1640    rosuvastatin (CRESTOR) 40 MG tablet  Daily-1800        06/21/21 1640    Increase activity slowly        06/21/21 1640    Discharge instructions       Comments: From Dr. Loleta Books: You were admitted for  dizziness.  Here we found that you had had a small heart attack, and also that you had dizziness from abnormal heart rhythms (abnormal electrical rhythms) coming from the aneurysm in your heart.  You started the medicine amiodarone to suppress these abnormal rhythms Take amiodarone 200 mg twice daily for the next 5 days, then on 9/20 reduce to 200 mg once daily  Also: STOP simvastatin and start the cholesterol medicine Crestor STOP the blood pressure medicine hydrochlorothiazide/HCTZ and start the medicine metoprolol twice daily  Go see the heart team as directed below in the To Do section   06/21/21 1643             Luna Fuse, MD 06/24/21 1547

## 2021-06-16 NOTE — Progress Notes (Signed)
  Echocardiogram 2D Echocardiogram has been performed.  Elmer Ramp 06/16/2021, 5:37 PM

## 2021-06-16 NOTE — ED Notes (Signed)
Attempted to give reportx1 

## 2021-06-17 ENCOUNTER — Encounter (HOSPITAL_COMMUNITY): Payer: Self-pay | Admitting: Internal Medicine

## 2021-06-17 DIAGNOSIS — I214 Non-ST elevation (NSTEMI) myocardial infarction: Principal | ICD-10-CM

## 2021-06-17 DIAGNOSIS — I208 Other forms of angina pectoris: Secondary | ICD-10-CM | POA: Diagnosis not present

## 2021-06-17 LAB — BASIC METABOLIC PANEL
Anion gap: 9 (ref 5–15)
BUN: 18 mg/dL (ref 8–23)
CO2: 23 mmol/L (ref 22–32)
Calcium: 9.3 mg/dL (ref 8.9–10.3)
Chloride: 105 mmol/L (ref 98–111)
Creatinine, Ser: 1.28 mg/dL — ABNORMAL HIGH (ref 0.61–1.24)
GFR, Estimated: >60 mL/min (ref >60)
Glucose, Bld: 93 mg/dL (ref 70–99)
Potassium: 4 mmol/L (ref 3.5–5.1)
Sodium: 137 mmol/L (ref 135–145)

## 2021-06-17 LAB — HEPARIN LEVEL (UNFRACTIONATED): Heparin Unfractionated: 0.21 IU/mL — ABNORMAL LOW (ref 0.30–0.70)

## 2021-06-17 LAB — TROPONIN I (HIGH SENSITIVITY)
Troponin I (High Sensitivity): 136 ng/L (ref <18)
Troponin I (High Sensitivity): 113 ng/L (ref <18)

## 2021-06-17 MED ORDER — ROSUVASTATIN CALCIUM 20 MG PO TABS
40.0000 mg | ORAL_TABLET | Freq: Every day | ORAL | Status: DC
  Filled 2021-06-17: qty 2

## 2021-06-17 MED ORDER — HEPARIN BOLUS VIA INFUSION
4000.0000 [IU] | Freq: Once | INTRAVENOUS | Status: AC
  Administered 2021-06-17: 4000 [IU] via INTRAVENOUS
  Filled 2021-06-17: qty 4000

## 2021-06-17 MED ORDER — HEPARIN (PORCINE) 25000 UT/250ML-% IV SOLN
1150.0000 [IU]/h | INTRAVENOUS | Status: DC
  Administered 2021-06-17: 900 [IU]/h via INTRAVENOUS
  Administered 2021-06-18: 1250 [IU]/h via INTRAVENOUS
  Administered 2021-06-19: 1150 [IU]/h via INTRAVENOUS
  Filled 2021-06-17 (×3): qty 250

## 2021-06-17 MED ORDER — ROSUVASTATIN CALCIUM 20 MG PO TABS
40.0000 mg | ORAL_TABLET | Freq: Every day | ORAL | Status: DC
  Administered 2021-06-17 – 2021-06-21 (×5): 40 mg via ORAL
  Filled 2021-06-17 (×4): qty 2

## 2021-06-17 MED ORDER — ALFUZOSIN HCL ER 10 MG PO TB24
10.0000 mg | ORAL_TABLET | Freq: Every day | ORAL | Status: DC
  Administered 2021-06-17 – 2021-06-21 (×5): 10 mg via ORAL
  Filled 2021-06-17 (×5): qty 1

## 2021-06-17 MED ORDER — ANGIOPLASTY BOOK
Freq: Once | Status: AC
  Filled 2021-06-17: qty 1

## 2021-06-17 MED ORDER — METOPROLOL TARTRATE 25 MG PO TABS
25.0000 mg | ORAL_TABLET | Freq: Two times a day (BID) | ORAL | Status: DC
  Administered 2021-06-17 – 2021-06-21 (×7): 25 mg via ORAL
  Filled 2021-06-17 (×8): qty 1

## 2021-06-17 MED ORDER — COVID-19MRNA BIVAL VACC PFIZER 30 MCG/0.3ML IM SUSP: 0.3000 mL | Freq: Once | INTRAMUSCULAR | Status: DC

## 2021-06-17 NOTE — Progress Notes (Addendum)
ANTICOAGULATION CONSULT NOTE - Initial Consult  Pharmacy Consult for Heparin Indication: chest pain/ACS, NSTEMI   No Known Allergies  Patient Measurements: Height: '5\' 11"'$  (180.3 cm) Weight: 74.4 kg (164 lb 1.6 oz) IBW/kg (Calculated) : 75.3 Heparin Dosing Weight: 74.4 kg  Vital Signs: Temp: 98.5 F (36.9 C) (09/10 0619) Temp Source: Oral (09/10 0619) BP: 109/63 (09/10 0619) Pulse Rate: 67 (09/09 2109)  Labs: Recent Labs    06/16/21 0800 06/16/21 1015 06/16/21 1640 06/17/21 0245  HGB 12.7*  --   --   --   HCT 37.9*  --   --   --   PLT 168  --   --   --   CREATININE 1.32*  --   --  1.28*  TROPONINIHS 12 47* 292*  --     Estimated Creatinine Clearance: 60.5 mL/min (A) (by C-G formula based on SCr of 1.28 mg/dL (H)).   Medical History: Past Medical History:  Diagnosis Date   Arthritis    knees, hands   Cataract    Coronary artery disease    Dysrhythmia 06/2014   sinus brady with PVC'S   GERD (gastroesophageal reflux disease)    Hepatitis C    Hx of adenomatous colonic polyps 09/21/2014   Hypercholesteremia    Hypertension    Myocardial infarction [facility])     Medications:  Scheduled:   alfuzosin  10 mg Oral Daily   aspirin EC  81 mg Oral Daily   COVID-19 mRNA bivalent vaccine ([employer])  0.3 mL Intramuscular ONCE-1600   metoprolol tartrate  25 mg Oral BID   rosuvastatin  40 mg Oral Daily    Assessment: 65 yo male presenting with NSTEMI.  Patient has history of CAD s/p CABG (2003).  Patient was not on anticoagulation PTA.  Pharmacy consulted for heparin dosing.  Plans noted for cardiac cath on 9/12.   CBC stable.  No signs/symptoms of bleeding noted.   Goal of Therapy:  Heparin level 0.3-0.7 units/ml Monitor platelets by anticoagulation protocol: Yes   Plan:  Give heparin bolus 4000 units x 1, followed by heparin infusion at 900 units/hr Check ~6hr heparin level Daily heparin level, CBC Monitor for signs/symptoms of bleeding   Vance Peper,  PharmD PGY1 Pharmacy Resident Phone [phone] 06/17/2021 7:10 AM   Please check AMION for all [place] phone numbers After 10:00 PM, call [place] [phone]

## 2021-06-17 NOTE — Progress Notes (Addendum)
Progress Note    Nicholas [name]  [id number] DOB: 1956-06-04  DOA: 06/16/2021 PCP: Alroy Dust, L.Marlou Sa, MD      Brief Narrative:    Medical records reviewed and are as summarized below:  Nicholas [name] is a 65 y.o. male with medical history significant for CAD s/p CABG, hypertension, hyperlipidemia, sinus bradycardia and PVCs, who presented to the hospital with chest pain and dizziness.  Troponins were elevated with maximum troponin of 292.      Assessment/Plan:   Principal Problem:   NSTEMI (non-ST elevated myocardial infarction) [facility]) Active Problems:   Angina at rest [facility])    Body mass index is 22.89 kg/m.   Acute NSTEMI in a patient with CAD s/p CABG: Continue IV heparin and monitor heparin level per protocol.  Continue aspirin, rosuvastatin and metoprolol.  Plan for cardiac cath on 06/19/2021.  Follow-up with cardiologist.  2D echo showed EF estimated at 45 to 50% moderate LVH, indeterminate LV diastolic parameters  Frequent PVCs: Continue metoprolol  Suspected sarcoidosis on 2D echo: Cardiac MRI as an outpatient.  Other comorbidities include hypertension, hyperlipidemia  Diet Order             Diet Heart Room service appropriate? Yes; Fluid consistency: Thin  Diet effective now                      Consultants: Cardiologist  Procedures: None    Medications:    alfuzosin  10 mg Oral q1800   angioplasty book   Does not apply Once   aspirin EC  81 mg Oral Daily   metoprolol tartrate  25 mg Oral BID   rosuvastatin  40 mg Oral q1800   Continuous Infusions:  heparin 900 Units/hr (06/17/21 0843)     Anti-infectives (From admission, onward)    None              Family Communication/Anticipated D/C date and plan/Code Status   DVT prophylaxis:      Code Status: Full Code  Family Communication: Plan discussed with his wife at the bedside Disposition Plan:    Status is: Inpatient  Remains inpatient appropriate  because:IV treatments appropriate due to intensity of illness or inability to take PO and Inpatient level of care appropriate due to severity of illness  Dispo: The patient is from: Home              Anticipated d/c is to: Home              Patient currently is not medically stable to d/c.   Difficult to place patient No           Subjective:   Interval events noted.  He feels better today.  No chest pain or shortness of breath.  Objective:    Vitals:   06/16/21 1616 06/16/21 2109 06/17/21 0619 06/17/21 1254  BP: 138/71 97/83 109/63 (!) 124/92  Pulse: 66 67  (!) 59  Resp: '16 16 16 14  '$ Temp: 98.1 F (36.7 C) 98.2 F (36.8 C) 98.5 F (36.9 C) 98 F (36.7 C)  TempSrc: Oral Oral Oral Oral  SpO2: 100% 100% 100% 100%  Weight:      Height:       No data found.   Intake/Output Summary (Last 24 hours) at 06/17/2021 1511 Last data filed at 06/17/2021 1256 Gross per 24 hour  Intake 366 ml  Output 400 ml  Net -34 ml   [employer]  06/16/21 0748 06/16/21 1609  Weight: 75.8 kg 74.4 kg    Exam:  GEN: NAD SKIN: No rash EYES: EOMI ENT: MMM CV: RRR PULM: CTA B ABD: soft, ND, NT, +BS CNS: AAO x 3, non focal EXT: No edema or tenderness        Data Reviewed:   I have personally reviewed following labs and imaging studies:  Labs: Labs show the following:   Basic Metabolic Panel: Recent Labs  Lab 06/16/21 0800 06/16/21 1640 06/17/21 0245  NA 136  --  137  K 4.3  --  4.0  CL 106  --  105  CO2 22  --  23  GLUCOSE 104*  --  93  BUN 17  --  18  CREATININE 1.32*  --  1.28*  CALCIUM 8.8*  --  9.3  PHOS  --  2.8  --    GFR Estimated Creatinine Clearance: 60.5 mL/min (A) (by C-G formula based on SCr of 1.28 mg/dL (H)). Liver Function Tests: No results for input(s): AST, ALT, ALKPHOS, BILITOT, PROT, ALBUMIN in the last 168 hours. No results for input(s): LIPASE, AMYLASE in the last 168 hours. No results for input(s): AMMONIA in the last 168  hours. Coagulation profile No results for input(s): INR, PROTIME in the last 168 hours.  CBC: Recent Labs  Lab 06/16/21 0800  WBC 6.4  NEUTROABS 4.5  HGB 12.7*  HCT 37.9*  MCV 84.4  PLT 168   Cardiac Enzymes: No results for input(s): CKTOTAL, CKMB, CKMBINDEX, TROPONINI in the last 168 hours. BNP (last 3 results) No results for input(s): PROBNP in the last 8760 hours. CBG: No results for input(s): GLUCAP in the last 168 hours. D-Dimer: No results for input(s): DDIMER in the last 72 hours. Hgb A1c: No results for input(s): HGBA1C in the last 72 hours. Lipid Profile: No results for input(s): CHOL, HDL, LDLCALC, TRIG, CHOLHDL, LDLDIRECT in the last 72 hours. Thyroid function studies: No results for input(s): TSH, T4TOTAL, T3FREE, THYROIDAB in the last 72 hours.  Invalid input(s): FREET3 Anemia work up: No results for input(s): VITAMINB12, FOLATE, FERRITIN, TIBC, IRON, RETICCTPCT in the last 72 hours. Sepsis Labs: Recent Labs  Lab 06/16/21 0800  WBC 6.4    Microbiology Recent Results (from the past 240 hour(s))  Resp Panel by RT-PCR (Flu A&B, Covid) Nasopharyngeal Swab     Status: None   Collection Time: 06/16/21  8:02 PM   Specimen: Nasopharyngeal Swab; Nasopharyngeal(NP) swabs in vial transport medium  Result Value Ref Range Status   SARS Coronavirus 2 by RT PCR NEGATIVE NEGATIVE Final    Comment: (NOTE) SARS-CoV-2 target nucleic acids are NOT DETECTED.  The SARS-CoV-2 RNA is generally detectable in upper respiratory specimens during the acute phase of infection. The lowest concentration of SARS-CoV-2 viral copies this assay can detect is 138 copies/mL. A negative result does not preclude SARS-Cov-2 infection and should not be used as the sole basis for treatment or other patient management decisions. A negative result may occur with  improper specimen collection/handling, submission of specimen other than nasopharyngeal swab, presence of viral mutation(s) within  the areas targeted by this assay, and inadequate number of viral copies(<138 copies/mL). A negative result must be combined with clinical observations, patient history, and epidemiological information. The expected result is Negative.  Fact Sheet for Patients:  EntrepreneurPulse.com.au  Fact Sheet for Healthcare Providers:  IncredibleEmployment.be  This test is no t yet approved or cleared by the [place] and  has been authorized  for detection and/or diagnosis of SARS-CoV-2 by FDA under an Emergency Use Authorization (EUA). This EUA will remain  in effect (meaning this test can be used) for the duration of the COVID-19 declaration under Section 564(b)(1) of the Act, 21 U.S.C.section 360bbb-3(b)(1), unless the authorization is terminated  or revoked sooner.       Influenza A by PCR NEGATIVE NEGATIVE Final   Influenza B by PCR NEGATIVE NEGATIVE Final    Comment: (NOTE) The Xpert Xpress SARS-CoV-2/FLU/RSV plus assay is intended as an aid in the diagnosis of influenza from Nasopharyngeal swab specimens and should not be used as a sole basis for treatment. Nasal washings and aspirates are unacceptable for Xpert Xpress SARS-CoV-2/FLU/RSV testing.  Fact Sheet for Patients: EntrepreneurPulse.com.au  Fact Sheet for Healthcare Providers: IncredibleEmployment.be  This test is not yet approved or cleared by the [place] FDA and has been authorized for detection and/or diagnosis of SARS-CoV-2 by FDA under an Emergency Use Authorization (EUA). This EUA will remain in effect (meaning this test can be used) for the duration of the COVID-19 declaration under Section 564(b)(1) of the Act, 21 U.S.C. section 360bbb-3(b)(1), unless the authorization is terminated or revoked.  Performed at [facility], [place] [address] [place], [place] [address]     Procedures and diagnostic studies:  DG Chest Port  1 View  Result Date: 06/16/2021 CLINICAL DATA:  Pain, midsternal EXAM: PORTABLE CHEST 1 VIEW COMPARISON:  Chest radiograph 05/21/2014 FINDINGS: Median sternotomy wires and mediastinal surgical material is again seen. The superior most wire is fractured, unchanged. The cardiomediastinal silhouette is normal. There is no focal consolidation or pulmonary edema. There is no pleural effusion or pneumothorax. There is no acute osseous abnormality. IMPRESSION: No radiographic evidence of acute cardiopulmonary process. Electronically Signed   By: Valetta Mole M.D.   On: 06/16/2021 08:20   ECHOCARDIOGRAM COMPLETE  Result Date: 06/16/2021    ECHOCARDIOGRAM REPORT   Patient Name:   Nicholas [name] Date of Exam: 06/16/2021 Medical Rec #:  [id number]      Height:       71.0 in Accession #:    [id number]     Weight:       164.1 lb Date of Birth:  06-Jun-1956      BSA:          1.939 m Patient Age:    36 years       BP:           138/71 mmHg Patient Gender: M              HR:           69 bpm. Exam Location:  Inpatient Procedure: 2D Echo, Cardiac Doppler and Color Doppler Indications:    Chest pain  History:        Patient has prior history of Echocardiogram examinations, most                 recent 06/11/2014. CAD and Previous Myocardial Infarction, Prior                 CABG; Arrythmias:PVC.  Sonographer:    Merrie Roof RDCS Referring Phys: [place]  1. Frequent PVCs. Left ventricular ejection fraction, by estimation, is 45 to 50%. The left ventricle has mildly decreased function. The left ventricle demonstrates regional wall motion abnormalities. Akinesis of basal inferior wall. There is moderate left ventricular hypertrophy. Left ventricular diastolic parameters are indeterminate.  2. Right ventricular systolic  function is mildly reduced. The right ventricular size is normal. Tricuspid regurgitation signal is inadequate for assessing PA pressure.  3. The mitral valve is normal in structure. Mild mitral  valve regurgitation. No evidence of mitral stenosis.  4. The aortic valve is tricuspid. Aortic valve regurgitation is trivial. No aortic stenosis is present.  5. The inferior vena cava is normal in size with greater than 50% respiratory variability, suggesting right atrial pressure of 3 mmHg. Conclusion(s)/Recommendation(s): Given frequent PVCs and thinning/akinesis in noncoronary distribution (localized to basal inferior wall), would consider sarcoidosis and recommend cardiac MRI for further evaluation. FINDINGS  Left Ventricle: Left ventricular ejection fraction, by estimation, is 45 to 50%. The left ventricle has mildly decreased function. The left ventricle demonstrates regional wall motion abnormalities. The left ventricular internal cavity size was normal in size. There is moderate left ventricular hypertrophy. Left ventricular diastolic parameters are indeterminate. Right Ventricle: The right ventricular size is normal. Right vetricular wall thickness was not well visualized. Right ventricular systolic function is mildly reduced. Tricuspid regurgitation signal is inadequate for assessing PA pressure. Left Atrium: Left atrial size was normal in size. Right Atrium: Right atrial size was normal in size. Pericardium: There is no evidence of pericardial effusion. Mitral Valve: The mitral valve is normal in structure. Mild mitral valve regurgitation. No evidence of mitral valve stenosis. Tricuspid Valve: The tricuspid valve is normal in structure. Tricuspid valve regurgitation is trivial. Aortic Valve: The aortic valve is tricuspid. Aortic valve regurgitation is trivial. No aortic stenosis is present. Pulmonic Valve: The pulmonic valve was not well visualized. Pulmonic valve regurgitation is trivial. Aorta: The aortic root and ascending aorta are structurally normal, with no evidence of dilitation. Venous: The inferior vena cava is normal in size with greater than 50% respiratory variability, suggesting right  atrial pressure of 3 mmHg. IAS/Shunts: The interatrial septum was not well visualized.  LEFT VENTRICLE PLAX 2D LVIDd:         4.90 cm LVIDs:         4.00 cm LV PW:         1.50 cm LV IVS:        1.50 cm LVOT diam:     2.10 cm LV SV:         60 LV SV Index:   31 LVOT Area:     3.46 cm  LV Volumes (MOD) LV vol d, MOD A4C: 126.0 ml LV vol s, MOD A4C: 51.6 ml LV SV MOD A4C:     126.0 ml RIGHT VENTRICLE RV Basal diam:  3.50 cm LEFT ATRIUM           Index       RIGHT ATRIUM           Index LA diam:      3.10 cm 1.60 cm/m  RA Area:     15.10 cm LA Vol (A4C): 42.8 ml 22.08 ml/m RA Volume:   36.30 ml  18.73 ml/m  AORTIC VALVE LVOT Vmax:   101.00 cm/s LVOT Vmean:  68.700 cm/s LVOT VTI:    0.172 m  AORTA Ao Root diam: 3.50 cm Ao Asc diam:  3.40 cm  SHUNTS Systemic VTI:  0.17 m Systemic Diam: 2.10 cm Oswaldo Milian MD Electronically signed by Oswaldo Milian MD Signature Date/Time: 06/16/2021/10:35:35 PM    Final                LOS: 1 day   Yanira Tolsma  [facility]   Pager on [email or web address].  If 7PM-7AM, please contact night-coverage at [email or web address]     06/17/2021, 3:11 PM

## 2021-06-17 NOTE — Progress Notes (Signed)
ANTICOAGULATION CONSULT NOTE   Pharmacy Consult for Heparin Indication: chest pain/ACS, NSTEMI   No Known Allergies  Patient Measurements: Height: '5\' 11"'$  (180.3 cm) Weight: 74.4 kg (164 lb 1.6 oz) IBW/kg (Calculated) : 75.3 Heparin Dosing Weight: 74.4 kg  Vital Signs: Temp: 98.8 F (37.1 C) (09/10 1641) Temp Source: Oral (09/10 1641) BP: 133/87 (09/10 1641) Pulse Rate: 54 (09/10 1641)  Labs: Recent Labs    06/16/21 0800 06/16/21 1015 06/16/21 1640 06/17/21 0245 06/17/21 0722 06/17/21 1025 06/17/21 1530  HGB 12.7*  --   --   --   --   --   --   HCT 37.9*  --   --   --   --   --   --   PLT 168  --   --   --   --   --   --   HEPARINUNFRC  --   --   --   --   --   --  0.21*  CREATININE 1.32*  --   --  1.28*  --   --   --   TROPONINIHS 12   < > 292*  --  136* 113*  --    < > = values in this interval not displayed.     Estimated Creatinine Clearance: 60.5 mL/min (A) (by C-G formula based on SCr of 1.28 mg/dL (H)).   Medical History: Past Medical History:  Diagnosis Date   Arthritis    knees, hands   Cataract    Coronary artery disease    Dysrhythmia 06/2014   sinus brady with PVC'S   GERD (gastroesophageal reflux disease)    Hepatitis C    Hx of adenomatous colonic polyps 09/21/2014   Hypercholesteremia    Hypertension    Myocardial infarction [facility])     Medications:  Scheduled:   alfuzosin  10 mg Oral q1800   angioplasty book   Does not apply Once   aspirin EC  81 mg Oral Daily   metoprolol tartrate  25 mg Oral BID   rosuvastatin  40 mg Oral q1800    Assessment: 65 yo male presenting with NSTEMI.  Patient has history of CAD s/p CABG (2003).  Patient was not on anticoagulation PTA.  Pharmacy consulted for heparin dosing.  Plans noted for cardiac cath on 9/12.   CBC stable.  No signs/symptoms of bleeding noted.   Initial heparin level this evening is slightly below goal at 0.21.  No known issues with IV infusion.  No overt bleeding or complications  noted.  Goal of Therapy:  Heparin level 0.3-0.7 units/ml Monitor platelets by anticoagulation protocol: Yes   Plan:  Increase IV heparin to 1050 units/hr. Recheck heparin level in 8 hrs. Daily heparin level and CBC.  [place] Crane, Roylene Reason, BCCP Clinical Pharmacist  06/17/2021 5:30 PM   [facility] pharmacy phone numbers are listed on [email or web address]

## 2021-06-17 NOTE — Progress Notes (Signed)
Progress Note  Patient Name: Nicholas [name] Date of Encounter: 06/17/2021  [facility] Cardiologist: Dr Marlou Porch  Subjective   No CP or dyspnea  Inpatient Medications    Scheduled Meds:  alfuzosin  10 mg Oral Daily   aspirin EC  81 mg Oral Daily   COVID-19 mRNA bivalent vaccine (Pfizer)  0.3 mL Intramuscular ONCE-1600   enoxaparin (LOVENOX) injection  40 mg Subcutaneous Q24H   metoprolol tartrate  12.5 mg Oral BID   simvastatin  40 mg Oral QPM   Continuous Infusions:  PRN Meds:    Vital Signs    Vitals:   06/16/21 1609 06/16/21 1616 06/16/21 2109 06/17/21 0619  BP:  138/71 97/83 109/63  Pulse:  66 67   Resp:  '16 16 16  '$ Temp:  98.1 F (36.7 C) 98.2 F (36.8 C) 98.5 F (36.9 C)  TempSrc:  Oral Oral Oral  SpO2:  100% 100% 100%  Weight: 74.4 kg     Height: '5\' 11"'$  (1.803 m)      No intake or output data in the 24 hours ending 06/17/21 0631 Last 3 Weights 06/16/2021 06/16/2021 02/21/2018  Weight (lbs) 164 lb 1.6 oz 167 lb 1.7 oz 167 lb  Weight (kg) 74.435 kg 75.8 kg 75.751 kg      Telemetry    Sinus with PVCs - Personally Reviewed   Physical Exam   GEN: No acute distress.   Neck: No JVD Cardiac: RRR, no murmurs, rubs, or gallops.  Respiratory: Clear to auscultation bilaterally. GI: Soft, nontender, non-distended  MS: No edema Neuro:  Nonfocal  Psych: Normal affect   Labs    High Sensitivity Troponin:   Recent Labs  Lab 06/16/21 0800 06/16/21 1015 06/16/21 1640  TROPONINIHS 12 47* 292*      Chemistry Recent Labs  Lab 06/16/21 0800 06/17/21 0245  NA 136 137  K 4.3 4.0  CL 106 105  CO2 22 23  GLUCOSE 104* 93  BUN 17 18  CREATININE 1.32* 1.28*  CALCIUM 8.8* 9.3  GFRNONAA 60* >60  ANIONGAP 8 9     Hematology Recent Labs  Lab 06/16/21 0800  WBC 6.4  RBC 4.49  HGB 12.7*  HCT 37.9*  MCV 84.4  MCH 28.3  MCHC 33.5  RDW 14.0  PLT 168     Radiology    DG Chest Port 1 View  Result Date: 06/16/2021 CLINICAL DATA:  Pain,  midsternal EXAM: PORTABLE CHEST 1 VIEW COMPARISON:  Chest radiograph 05/21/2014 FINDINGS: Median sternotomy wires and mediastinal surgical material is again seen. The superior most wire is fractured, unchanged. The cardiomediastinal silhouette is normal. There is no focal consolidation or pulmonary edema. There is no pleural effusion or pneumothorax. There is no acute osseous abnormality. IMPRESSION: No radiographic evidence of acute cardiopulmonary process. Electronically Signed   By: Valetta Mole M.D.   On: 06/16/2021 08:20   ECHOCARDIOGRAM COMPLETE  Result Date: 06/16/2021    ECHOCARDIOGRAM REPORT   Patient Name:   [name] Date of Exam: 06/16/2021 Medical Rec #:  [id number]      Height:       71.0 in Accession #:    [id number]     Weight:       164.1 lb Date of Birth:  08-15-1956      BSA:          1.939 m Patient Age:    65 years       BP:  138/71 mmHg Patient Gender: M              HR:           69 bpm. Exam Location:  Inpatient Procedure: 2D Echo, Cardiac Doppler and Color Doppler Indications:    Chest pain  History:        Patient has prior history of Echocardiogram examinations, most                 recent 06/11/2014. CAD and Previous Myocardial Infarction, Prior                 CABG; Arrythmias:PVC.  Sonographer:    Merrie Roof RDCS Referring Phys: [place]  1. Frequent PVCs. Left ventricular ejection fraction, by estimation, is 45 to 50%. The left ventricle has mildly decreased function. The left ventricle demonstrates regional wall motion abnormalities. Akinesis of basal inferior wall. There is moderate left ventricular hypertrophy. Left ventricular diastolic parameters are indeterminate.  2. Right ventricular systolic function is mildly reduced. The right ventricular size is normal. Tricuspid regurgitation signal is inadequate for assessing PA pressure.  3. The mitral valve is normal in structure. Mild mitral valve regurgitation. No evidence of mitral stenosis.   4. The aortic valve is tricuspid. Aortic valve regurgitation is trivial. No aortic stenosis is present.  5. The inferior vena cava is normal in size with greater than 50% respiratory variability, suggesting right atrial pressure of 3 mmHg. Conclusion(s)/Recommendation(s): Given frequent PVCs and thinning/akinesis in noncoronary distribution (localized to basal inferior wall), would consider sarcoidosis and recommend cardiac MRI for further evaluation. FINDINGS  Left Ventricle: Left ventricular ejection fraction, by estimation, is 45 to 50%. The left ventricle has mildly decreased function. The left ventricle demonstrates regional wall motion abnormalities. The left ventricular internal cavity size was normal in size. There is moderate left ventricular hypertrophy. Left ventricular diastolic parameters are indeterminate. Right Ventricle: The right ventricular size is normal. Right vetricular wall thickness was not well visualized. Right ventricular systolic function is mildly reduced. Tricuspid regurgitation signal is inadequate for assessing PA pressure. Left Atrium: Left atrial size was normal in size. Right Atrium: Right atrial size was normal in size. Pericardium: There is no evidence of pericardial effusion. Mitral Valve: The mitral valve is normal in structure. Mild mitral valve regurgitation. No evidence of mitral valve stenosis. Tricuspid Valve: The tricuspid valve is normal in structure. Tricuspid valve regurgitation is trivial. Aortic Valve: The aortic valve is tricuspid. Aortic valve regurgitation is trivial. No aortic stenosis is present. Pulmonic Valve: The pulmonic valve was not well visualized. Pulmonic valve regurgitation is trivial. Aorta: The aortic root and ascending aorta are structurally normal, with no evidence of dilitation. Venous: The inferior vena cava is normal in size with greater than 50% respiratory variability, suggesting right atrial pressure of 3 mmHg. IAS/Shunts: The interatrial  septum was not well visualized.  LEFT VENTRICLE PLAX 2D LVIDd:         4.90 cm LVIDs:         4.00 cm LV PW:         1.50 cm LV IVS:        1.50 cm LVOT diam:     2.10 cm LV SV:         60 LV SV Index:   31 LVOT Area:     3.46 cm  LV Volumes (MOD) LV vol d, MOD A4C: 126.0 ml LV vol s, MOD A4C: 51.6 ml LV SV MOD A4C:  126.0 ml RIGHT VENTRICLE RV Basal diam:  3.50 cm LEFT ATRIUM           Index       RIGHT ATRIUM           Index LA diam:      3.10 cm 1.60 cm/m  RA Area:     15.10 cm LA Vol (A4C): 42.8 ml 22.08 ml/m RA Volume:   36.30 ml  18.73 ml/m  AORTIC VALVE LVOT Vmax:   101.00 cm/s LVOT Vmean:  68.700 cm/s LVOT VTI:    0.172 m  AORTA Ao Root diam: 3.50 cm Ao Asc diam:  3.40 cm  SHUNTS Systemic VTI:  0.17 m Systemic Diam: 2.10 cm Oswaldo Milian MD Electronically signed by Oswaldo Milian MD Signature Date/Time: 06/16/2021/10:35:35 PM    Final      Patient Profile      65 year old male with past medical history of coronary artery disease status post coronary artery bypass graft, hypertension, hyperlipidemia for evaluation of PVCs and dizziness.  Echocardiogram shows ejection fraction 45 to 50%, inferior hypokinesis, moderate left ventricular hypertrophy, mild RV dysfunction, mild mitral regurgitation, trace aortic insufficiency.  Pattern suggestive of sarcoid and cardiac MRI recommended.  Assessment & Plan    1 non-ST elevation myocardial infarction-patient now tells me that he had chest pain for approximately 1 hour yesterday prior to admission.  He is pain-free this morning.  His troponin has increased slightly but there appears to be significance.  We will treat with aspirin, heparin, statin and beta-blockade.  He will need cardiac catheterization.  The risk and benefits including myocardial infarction, CVA and death discussed and he agrees to proceed.  Plan to proceed on Monday.  Note echocardiogram shows ejection fraction 45 to 50%.    2 dizziness-patient was noted to have frequent  PVCs in the emergency room.  Question if this was contributing to his dizziness.  We will increase metoprolol to 25 mg twice daily and follow.  As outlined previously he did have some bradycardia in the past so we will need to watch on telemetry closely.     3 hypertension-continue metoprolol and follow.   4 coronary artery disease-continue aspirin and statin.   5 hyperlipidemia-given documented coronary disease we will discontinue Zocor and treat with Crestor 40 mg daily.  He will need lipids and liver check in 12 weeks.  6 question sarcoid on echocardiogram-Will need cardiac MRI following discharge.  For questions or updates, please contact [place] Please consult [email or web address] for contact info under        Signed, Kirk Ruths, MD  06/17/2021, 6:31 AM

## 2021-06-18 DIAGNOSIS — I214 Non-ST elevation (NSTEMI) myocardial infarction: Secondary | ICD-10-CM | POA: Diagnosis not present

## 2021-06-18 DIAGNOSIS — I4729 Other ventricular tachycardia: Secondary | ICD-10-CM

## 2021-06-18 DIAGNOSIS — I472 Ventricular tachycardia: Secondary | ICD-10-CM

## 2021-06-18 LAB — HEPARIN LEVEL (UNFRACTIONATED)
Heparin Unfractionated: 0.12 IU/mL — ABNORMAL LOW (ref 0.30–0.70)
Heparin Unfractionated: 0.66 IU/mL (ref 0.30–0.70)
Heparin Unfractionated: 0.6 IU/mL (ref 0.30–0.70)

## 2021-06-18 LAB — BASIC METABOLIC PANEL
Anion gap: 9 (ref 5–15)
BUN: 14 mg/dL (ref 8–23)
CO2: 21 mmol/L — ABNORMAL LOW (ref 22–32)
Calcium: 9.1 mg/dL (ref 8.9–10.3)
Chloride: 107 mmol/L (ref 98–111)
Creatinine, Ser: 1.31 mg/dL — ABNORMAL HIGH (ref 0.61–1.24)
GFR, Estimated: >60 mL/min (ref >60)
Glucose, Bld: 99 mg/dL (ref 70–99)
Potassium: 3.9 mmol/L (ref 3.5–5.1)
Sodium: 137 mmol/L (ref 135–145)

## 2021-06-18 LAB — CBC
HCT: 38.2 % — ABNORMAL LOW (ref 39.0–52.0)
Hemoglobin: 13 g/dL (ref 13.0–17.0)
MCH: 28.4 pg (ref 26.0–34.0)
MCHC: 34 g/dL (ref 30.0–36.0)
MCV: 83.4 fL (ref 80.0–100.0)
Platelets: 138 10*3/uL — ABNORMAL LOW (ref 150–400)
RBC: 4.58 MIL/uL (ref 4.22–5.81)
RDW: 13.7 % (ref 11.5–15.5)
WBC: 7.2 10*3/uL (ref 4.0–10.5)
nRBC: 0 % (ref 0.0–0.2)

## 2021-06-18 LAB — MAGNESIUM: Magnesium: 1.9 mg/dL (ref 1.7–2.4)

## 2021-06-18 MED ORDER — AMIODARONE LOAD VIA INFUSION
150.0000 mg | Freq: Once | INTRAVENOUS | Status: AC
  Administered 2021-06-18: 150 mg via INTRAVENOUS
  Filled 2021-06-18: qty 83.34

## 2021-06-18 MED ORDER — ASPIRIN 81 MG PO CHEW
81.0000 mg | CHEWABLE_TABLET | ORAL | Status: AC
  Administered 2021-06-19: 81 mg via ORAL
  Filled 2021-06-18: qty 1

## 2021-06-18 MED ORDER — POTASSIUM CHLORIDE CRYS ER 20 MEQ PO TBCR
40.0000 meq | EXTENDED_RELEASE_TABLET | Freq: Once | ORAL | Status: AC
  Administered 2021-06-18: 40 meq via ORAL
  Filled 2021-06-18: qty 2

## 2021-06-18 MED ORDER — SODIUM CHLORIDE 0.9 % IV SOLN: 250.0000 mL | INTRAVENOUS | Status: DC | PRN

## 2021-06-18 MED ORDER — AMIODARONE HCL IN DEXTROSE 360-4.14 MG/200ML-% IV SOLN
30.0000 mg/h | INTRAVENOUS | Status: DC
Start: 2021-06-18 — End: 2021-06-20
  Administered 2021-06-18 – 2021-06-20 (×5): 30 mg/h via INTRAVENOUS
  Filled 2021-06-18 (×5): qty 200

## 2021-06-18 MED ORDER — AMIODARONE HCL IN DEXTROSE 360-4.14 MG/200ML-% IV SOLN
60.0000 mg/h | INTRAVENOUS | Status: AC
  Administered 2021-06-18 (×2): 60 mg/h via INTRAVENOUS
  Filled 2021-06-18: qty 200

## 2021-06-18 MED ORDER — SODIUM CHLORIDE 0.9 % IV SOLN: INTRAVENOUS | Status: DC

## 2021-06-18 MED ORDER — MELATONIN 3 MG PO TABS
3.0000 mg | ORAL_TABLET | Freq: Every day | ORAL | Status: DC
  Administered 2021-06-18: 3 mg via ORAL
  Filled 2021-06-18: qty 1

## 2021-06-18 MED ORDER — MAGNESIUM SULFATE 2 GM/50ML IV SOLN
2.0000 g | Freq: Once | INTRAVENOUS | Status: AC
  Administered 2021-06-18: 2 g via INTRAVENOUS
  Filled 2021-06-18: qty 50

## 2021-06-18 MED ORDER — SODIUM CHLORIDE 0.9% FLUSH: 3.0000 mL | INTRAVENOUS | Status: DC | PRN

## 2021-06-18 MED ORDER — HEPARIN BOLUS VIA INFUSION
2000.0000 [IU] | Freq: Once | INTRAVENOUS | Status: AC
  Administered 2021-06-18: 2000 [IU] via INTRAVENOUS
  Filled 2021-06-18: qty 2000

## 2021-06-18 MED ORDER — SODIUM CHLORIDE 0.9% FLUSH: 3.0000 mL | Freq: Two times a day (BID) | INTRAVENOUS | Status: DC

## 2021-06-18 NOTE — Progress Notes (Signed)
ANTICOAGULATION CONSULT NOTE - Follow-Up Consult  Pharmacy Consult for Heparin Indication: chest pain/ACS, NSTEMI   No Known Allergies  Patient Measurements: Height: '5\' 11"'$  (180.3 cm) Weight: 74.4 kg (164 lb 1.6 oz) IBW/kg (Calculated) : 75.3 Heparin Dosing Weight: 74.4 kg  Vital Signs: Temp: 98.5 F (36.9 C) (09/11 2013) Temp Source: Oral (09/11 2013) BP: 105/49 (09/11 2013) Pulse Rate: 60 (09/11 2013)  Labs: Recent Labs    06/16/21 0800 06/16/21 1015 06/16/21 1640 06/17/21 0245 06/17/21 0722 06/17/21 1025 06/17/21 1530 06/18/21 0111 06/18/21 0948 06/18/21 1907  HGB 12.7*  --   --   --   --   --   --  13.0  --   --   HCT 37.9*  --   --   --   --   --   --  38.2*  --   --   PLT 168  --   --   --   --   --   --  138*  --   --   HEPARINUNFRC  --   --   --   --   --   --    < > 0.12* 0.66 0.60  CREATININE 1.32*  --   --  1.28*  --   --   --  1.31*  --   --   TROPONINIHS 12   < > 292*  --  136* 113*  --   --   --   --    < > = values in this interval not displayed.     Estimated Creatinine Clearance: 59.2 mL/min (A) (by C-G formula based on SCr of 1.31 mg/dL (H)).   Medical History: Past Medical History:  Diagnosis Date   Arthritis    knees, hands   Cataract    Coronary artery disease    Dysrhythmia 06/2014   sinus brady with PVC'S   GERD (gastroesophageal reflux disease)    Hepatitis C    Hx of adenomatous colonic polyps 09/21/2014   Hypercholesteremia    Hypertension    Myocardial infarction [facility])     Medications:  Scheduled:   alfuzosin  10 mg Oral q1800   [START ON 06/19/2021] aspirin  81 mg Oral Pre-Cath   aspirin EC  81 mg Oral Daily   metoprolol tartrate  25 mg Oral BID   rosuvastatin  40 mg Oral q1800   sodium chloride flush  3 mL Intravenous Q12H    Assessment: 65 yo male presenting with NSTEMI.  Patient has history of CAD s/p CABG (2003).  Patient was not on anticoagulation PTA.  Pharmacy consulted for heparin dosing.  Plans noted for  cardiac cath on 9/12.   Heparin level this evening remains at goal at 0.6.  No overt bleeding or complications noted.  Goal of Therapy:  Heparin level 0.3-0.7 units/ml Monitor platelets by anticoagulation protocol: Yes   Plan:  Continue IV heparin at 1150 units/hr. Daily heparin level, CBC. Monitor for signs/symptoms of bleeding.  [place] Crane, Roylene Reason, BCCP Clinical Pharmacist  06/18/2021 8:22 PM   [facility] pharmacy phone numbers are listed on [email or web address]

## 2021-06-18 NOTE — Progress Notes (Addendum)
Progress Note  Patient Name: Nicholas [name] Date of Encounter: 06/18/2021  [facility] Cardiologist: Dr Marlou Porch  Subjective   Patient with recurrent episode of lightheadedness and palpitations and on telemetry noted to have ventricular tachycardia.  Presently asymptomatic.  Denies dyspnea or chest pain.  Inpatient Medications    Scheduled Meds:  alfuzosin  10 mg Oral q1800   aspirin EC  81 mg Oral Daily   metoprolol tartrate  25 mg Oral BID   rosuvastatin  40 mg Oral q1800   Continuous Infusions:  heparin 1,250 Units/hr (06/18/21 0409)   PRN Meds:    Vital Signs    Vitals:   06/17/21 1641 06/17/21 2121 06/18/21 0019 06/18/21 0512  BP: 133/87  130/71 105/64  Pulse: (!) 54 66 70 77  Resp: '15  19 18  '$ Temp: 98.8 F (37.1 C)  98.8 F (37.1 C) 98.8 F (37.1 C)  TempSrc: Oral  Oral Oral  SpO2: 100%  100% 100%  Weight:      Height:        Intake/Output Summary (Last 24 hours) at 06/18/2021 0811 Last data filed at 06/18/2021 0500 Gross per 24 hour  Intake 821.83 ml  Output 1925 ml  Net -1103.17 ml   Last 3 Weights 06/16/2021 06/16/2021 02/21/2018  Weight (lbs) 164 lb 1.6 oz 167 lb 1.7 oz 167 lb  Weight (kg) 74.435 kg 75.8 kg 75.751 kg      Telemetry    Sinus with PVCs and nonsustained ventricular tachycardia.- Personally Reviewed   Physical Exam   GEN: NAD Neck: Supple Cardiac: RRR Respiratory: CTA GI: Soft, NT ND MS: No edema Neuro: Grossly intact Psych: Normal affect   Labs    High Sensitivity Troponin:   Recent Labs  Lab 06/16/21 0800 06/16/21 1015 06/16/21 1640 06/17/21 0722 06/17/21 1025  TROPONINIHS 12 47* 292* 136* 113*       Chemistry Recent Labs  Lab 06/16/21 0800 06/17/21 0245 06/18/21 0111  NA 136 137 137  K 4.3 4.0 3.9  CL 106 105 107  CO2 22 23 21*  GLUCOSE 104* 93 99  BUN '17 18 14  '$ CREATININE 1.32* 1.28* 1.31*  CALCIUM 8.8* 9.3 9.1  GFRNONAA 60* >60 >60  ANIONGAP '8 9 9      '$ Hematology Recent Labs  Lab  06/16/21 0800 06/18/21 0111  WBC 6.4 7.2  RBC 4.49 4.58  HGB 12.7* 13.0  HCT 37.9* 38.2*  MCV 84.4 83.4  MCH 28.3 28.4  MCHC 33.5 34.0  RDW 14.0 13.7  PLT 168 138*      Radiology    DG Chest Port 1 View  Result Date: 06/16/2021 CLINICAL DATA:  Pain, midsternal EXAM: PORTABLE CHEST 1 VIEW COMPARISON:  Chest radiograph 05/21/2014 FINDINGS: Median sternotomy wires and mediastinal surgical material is again seen. The superior most wire is fractured, unchanged. The cardiomediastinal silhouette is normal. There is no focal consolidation or pulmonary edema. There is no pleural effusion or pneumothorax. There is no acute osseous abnormality. IMPRESSION: No radiographic evidence of acute cardiopulmonary process. Electronically Signed   By: Valetta Mole M.D.   On: 06/16/2021 08:20   ECHOCARDIOGRAM COMPLETE  Result Date: 06/16/2021    ECHOCARDIOGRAM REPORT   Patient Name:   Nicholas [name] Date of Exam: 06/16/2021 Medical Rec #:  [id number]      Height:       71.0 in Accession #:    [id number]     Weight:       164.1  lb Date of Birth:  Feb 27, 1956      BSA:          1.939 m Patient Age:    65 years       BP:           138/71 mmHg Patient Gender: M              HR:           69 bpm. Exam Location:  Inpatient Procedure: 2D Echo, Cardiac Doppler and Color Doppler Indications:    Chest pain  History:        Patient has prior history of Echocardiogram examinations, most                 recent 06/11/2014. CAD and Previous Myocardial Infarction, Prior                 CABG; Arrythmias:PVC.  Sonographer:    Merrie Roof RDCS Referring Phys: [place]  1. Frequent PVCs. Left ventricular ejection fraction, by estimation, is 45 to 50%. The left ventricle has mildly decreased function. The left ventricle demonstrates regional wall motion abnormalities. Akinesis of basal inferior wall. There is moderate left ventricular hypertrophy. Left ventricular diastolic parameters are indeterminate.  2. Right  ventricular systolic function is mildly reduced. The right ventricular size is normal. Tricuspid regurgitation signal is inadequate for assessing PA pressure.  3. The mitral valve is normal in structure. Mild mitral valve regurgitation. No evidence of mitral stenosis.  4. The aortic valve is tricuspid. Aortic valve regurgitation is trivial. No aortic stenosis is present.  5. The inferior vena cava is normal in size with greater than 50% respiratory variability, suggesting right atrial pressure of 3 mmHg. Conclusion(s)/Recommendation(s): Given frequent PVCs and thinning/akinesis in noncoronary distribution (localized to basal inferior wall), would consider sarcoidosis and recommend cardiac MRI for further evaluation. FINDINGS  Left Ventricle: Left ventricular ejection fraction, by estimation, is 45 to 50%. The left ventricle has mildly decreased function. The left ventricle demonstrates regional wall motion abnormalities. The left ventricular internal cavity size was normal in size. There is moderate left ventricular hypertrophy. Left ventricular diastolic parameters are indeterminate. Right Ventricle: The right ventricular size is normal. Right vetricular wall thickness was not well visualized. Right ventricular systolic function is mildly reduced. Tricuspid regurgitation signal is inadequate for assessing PA pressure. Left Atrium: Left atrial size was normal in size. Right Atrium: Right atrial size was normal in size. Pericardium: There is no evidence of pericardial effusion. Mitral Valve: The mitral valve is normal in structure. Mild mitral valve regurgitation. No evidence of mitral valve stenosis. Tricuspid Valve: The tricuspid valve is normal in structure. Tricuspid valve regurgitation is trivial. Aortic Valve: The aortic valve is tricuspid. Aortic valve regurgitation is trivial. No aortic stenosis is present. Pulmonic Valve: The pulmonic valve was not well visualized. Pulmonic valve regurgitation is trivial.  Aorta: The aortic root and ascending aorta are structurally normal, with no evidence of dilitation. Venous: The inferior vena cava is normal in size with greater than 50% respiratory variability, suggesting right atrial pressure of 3 mmHg. IAS/Shunts: The interatrial septum was not well visualized.  LEFT VENTRICLE PLAX 2D LVIDd:         4.90 cm LVIDs:         4.00 cm LV PW:         1.50 cm LV IVS:        1.50 cm LVOT diam:     2.10 cm  LV SV:         60 LV SV Index:   31 LVOT Area:     3.46 cm  LV Volumes (MOD) LV vol d, MOD A4C: 126.0 ml LV vol s, MOD A4C: 51.6 ml LV SV MOD A4C:     126.0 ml RIGHT VENTRICLE RV Basal diam:  3.50 cm LEFT ATRIUM           Index       RIGHT ATRIUM           Index LA diam:      3.10 cm 1.60 cm/m  RA Area:     15.10 cm LA Vol (A4C): 42.8 ml 22.08 ml/m RA Volume:   36.30 ml  18.73 ml/m  AORTIC VALVE LVOT Vmax:   101.00 cm/s LVOT Vmean:  68.700 cm/s LVOT VTI:    0.172 m  AORTA Ao Root diam: 3.50 cm Ao Asc diam:  3.40 cm  SHUNTS Systemic VTI:  0.17 m Systemic Diam: 2.10 cm Oswaldo Milian MD Electronically signed by Oswaldo Milian MD Signature Date/Time: 06/16/2021/10:35:35 PM    Final      Patient Profile      65 year old male with past medical history of coronary artery disease status post coronary artery bypass graft, hypertension, hyperlipidemia for evaluation of PVCs and dizziness.  Echocardiogram shows ejection fraction 45 to 50%, inferior hypokinesis, moderate left ventricular hypertrophy, mild RV dysfunction, mild mitral regurgitation, trace aortic insufficiency.  Pattern suggestive of sarcoid and cardiac MRI recommended.  Patient with recurrent episode of lightheadedness and telemetry documented this was associated with ventricular tachycardia.  Assessment & Plan    1 non-ST elevation myocardial infarction-patient remains pain-free; however noted to have recurrent nonsustained ventricular tachycardia that is symptomatic.  Continue aspirin, heparin, statin and  beta-blockade.  Plan for cardiac catheterization tomorrow.  The risk and benefits including myocardial infarction, CVA and death previously discussed and he agrees to proceed.  Plan to proceed on Monday.  Note echocardiogram shows ejection fraction 45 to 50%.    2 dizziness-patient with recurrent episode of lightheadedness/palpitations this morning and telemetry shows nonsustained ventricular tachycardia.  As outlined above plan cardiac catheterization to exclude ischemia mediated VT.  If no lesion identified will need cardiac MRI in the hospital as echocardiogram suggests possible cardiac sarcoid.  Continue beta-blocker.  Add IV amiodarone.  Will need electrophysiology consult pending results of catheterization.  Note potassium is 3.9.  Check magnesium for completeness.   3 hypertension-continue metoprolol and follow.   4 coronary artery disease-continue aspirin and statin.   5 hyperlipidemia-continue Crestor.  Check lipids and liver in 12 weeks.  For questions or updates, please contact [place] Please consult [email or web address] for contact info under        Signed, Kirk Ruths, MD  06/18/2021, 8:11 AM

## 2021-06-18 NOTE — Progress Notes (Addendum)
ANTICOAGULATION CONSULT NOTE - Follow-Up Consult  Pharmacy Consult for Heparin Indication: chest pain/ACS, NSTEMI   No Known Allergies  Patient Measurements: Height: '5\' 11"'$  (180.3 cm) Weight: 74.4 kg (164 lb 1.6 oz) IBW/kg (Calculated) : 75.3 Heparin Dosing Weight: 74.4 kg  Vital Signs: Temp: 98.8 F (37.1 C) (09/11 0512) Temp Source: Oral (09/11 0512) BP: 125/86 (09/11 0942) Pulse Rate: 77 (09/11 0512)  Labs: Recent Labs    06/16/21 0800 06/16/21 1015 06/16/21 1640 06/17/21 0245 06/17/21 0722 06/17/21 1025 06/17/21 1530 06/18/21 0111 06/18/21 0948  HGB 12.7*  --   --   --   --   --   --  13.0  --   HCT 37.9*  --   --   --   --   --   --  38.2*  --   PLT 168  --   --   --   --   --   --  138*  --   HEPARINUNFRC  --   --   --   --   --   --  0.21* 0.12* 0.66  CREATININE 1.32*  --   --  1.28*  --   --   --  1.31*  --   TROPONINIHS 12   < > 292*  --  136* 113*  --   --   --    < > = values in this interval not displayed.    Estimated Creatinine Clearance: 59.2 mL/min (A) (by C-G formula based on SCr of 1.31 mg/dL (H)).   Medical History: Past Medical History:  Diagnosis Date   Arthritis    knees, hands   Cataract    Coronary artery disease    Dysrhythmia 06/2014   sinus brady with PVC'S   GERD (gastroesophageal reflux disease)    Hepatitis C    Hx of adenomatous colonic polyps 09/21/2014   Hypercholesteremia    Hypertension    Myocardial infarction [facility])     Medications:  Scheduled:   alfuzosin  10 mg Oral q1800   aspirin EC  81 mg Oral Daily   metoprolol tartrate  25 mg Oral BID   rosuvastatin  40 mg Oral q1800    Assessment: 65 yo male presenting with NSTEMI.  Patient has history of CAD s/p CABG (2003).  Patient was not on anticoagulation PTA.  Pharmacy consulted for heparin dosing.  Plans noted for cardiac cath on 9/12.   Heparin level this morning therapeutic at 0.66, on 1250 units/hr.  Given heparin level at higher end of goal after one-time  bolus and increased infusion rate, will decrease heparin infusion rate slightly to keep within goal and recheck ~6hr heparin level.  CBC stable.  No line issues or signs/symptoms of bleeding noted per RN.   Goal of Therapy:  Heparin level 0.3-0.7 units/ml Monitor platelets by anticoagulation protocol: Yes   Plan:  Decrease heparin infusion to 1150 units/hr.  Check ~6hr heparin level.  Daily heparin level, CBC. Monitor for signs/symptoms of bleeding.   Vance Peper, PharmD PGY1 Pharmacy Resident Phone [phone] 06/18/2021 12:26 PM   Please check AMION for all [place] phone numbers After 10:00 PM, call [place] [phone]

## 2021-06-18 NOTE — Progress Notes (Addendum)
Patient called complaining of not feeling well. When checked noted to be having runs of NSVT. 12 lead ECG machine obtained and called charge nurse to notify Dr. Stanford Breed who was rounding on unit. Dr. Stanford Breed at bedside to evaluate patient and rhythm.  12 lead ECG applied and patients rhythm changed to SR with PVCs. Plan of care reviewed by Dr. Stanford Breed with patient and daughter.Orders received for IV amiodarone with bolus. Started 2nd IV and initiated amiodarone bolus and infusion. Explained to patient and daughter about amiodarone and plan of care. Patient instructed to call with any adverse symptoms and to notify nurse quickly if there is any discomfort or other issues with IV site

## 2021-06-18 NOTE — Progress Notes (Signed)
[place] for Heparin Indication: chest pain/ACS, NSTEMI   No Known Allergies  Patient Measurements: Height: '5\' 11"'$  (180.3 cm) Weight: 74.4 kg (164 lb 1.6 oz) IBW/kg (Calculated) : 75.3 Heparin Dosing Weight: 74.4 kg  Vital Signs: Temp: 98.8 F (37.1 C) (09/11 0019) Temp Source: Oral (09/11 0019) BP: 130/71 (09/11 0019) Pulse Rate: 70 (09/11 0019)  Labs: Recent Labs    06/16/21 0800 06/16/21 1015 06/16/21 1640 06/17/21 0245 06/17/21 0722 06/17/21 1025 06/17/21 1530 06/18/21 0111  HGB 12.7*  --   --   --   --   --   --  13.0  HCT 37.9*  --   --   --   --   --   --  38.2*  PLT 168  --   --   --   --   --   --  138*  HEPARINUNFRC  --   --   --   --   --   --  0.21* 0.12*  CREATININE 1.32*  --   --  1.28*  --   --   --  1.31*  TROPONINIHS 12   < > 292*  --  136* 113*  --   --    < > = values in this interval not displayed.     Estimated Creatinine Clearance: 59.2 mL/min (A) (by C-G formula based on SCr of 1.31 mg/dL (H)).  Assessment: 65 y.o. male with chest pain for heparin   Goal of Therapy:  Heparin level 0.3-0.7 units/ml Monitor platelets by anticoagulation protocol: Yes   Plan:  Heparin 2000 units IV bolus, then increase heparin 1250 units/hr Check heparin level in 6 hours.  Phillis Knack, PharmD, BCPS

## 2021-06-18 NOTE — Progress Notes (Addendum)
Progress Note    Nicholas [name]  [id number] DOB: [birth year]  DOA: 06/16/2021 PCP: Alroy Dust, L.Marlou Sa, MD      Brief Narrative:    Medical records reviewed and are as summarized below:  Nicholas [name] is a 65 y.o. male with medical history significant for CAD s/p CABG, hypertension, hyperlipidemia, sinus bradycardia and PVCs, who presented to the hospital with chest pain and dizziness.  Troponins were elevated with maximum troponin of 292.  He was admitted to the hospital for acute NSTEMI.  He was treated with IV heparin infusion.  He developed NSVT and was associated with dizziness and palpitations.  He was started on IV amiodarone infusion for NSVT.    Assessment/Plan:   Principal Problem:   NSTEMI (non-ST elevated myocardial infarction) [facility]) Active Problems:   Angina at rest [facility])   NSVT (nonsustained ventricular tachycardia) (HCC)    Body mass index is 22.89 kg/m.   Acute NSTEMI in a patient with CAD s/p CABG: Continue IV heparin infusion and monitor heparin level per protocol.  Continue aspirin, rosuvastatin and metoprolol.  Plan for cardiac cath tomorrow.  2D echo showed EF estimated at 45 to 50% moderate LVH, indeterminate LV diastolic parameters  Frequent PVCs, ventricular bigeminy, NSVT with dizziness: He has been started on IV amiodarone infusion.  Continue metoprolol.  Plan for cardiac cath tomorrow.  Plan for cardiac MRI if cardiac cath does not reveal any culprit lesion.  Potassium is 3.9 and magnesium is 1.9.  Supplement potassium and magnesium to keep levels above 4 and 2 respectively.   Suspected cardiac sarcoid on 2D echo: Plan for cardiac MRI as an inpatient if cardiac cath is unrevealing.  Other comorbidities include hypertension, hyperlipidemia  Diet Order             Diet Heart Room service appropriate? Yes; Fluid consistency: Thin  Diet effective now                       Consultants: Cardiologist  Procedures: None    Medications:    alfuzosin  10 mg Oral q1800   aspirin EC  81 mg Oral Daily   metoprolol tartrate  25 mg Oral BID   rosuvastatin  40 mg Oral q1800   Continuous Infusions:  amiodarone 60 mg/hr (06/18/21 1203)   Followed by   amiodarone     heparin 1,250 Units/hr (06/18/21 0409)     Anti-infectives (From admission, onward)    None              Family Communication/Anticipated D/C date and plan/Code Status   DVT prophylaxis:      Code Status: Full Code  Family Communication: Plan discussed with his wife and granddaughter at the bedside Disposition Plan:    Status is: Inpatient  Remains inpatient appropriate because:IV treatments appropriate due to intensity of illness or inability to take PO and Inpatient level of care appropriate due to severity of illness  Dispo: The patient is from: Home              Anticipated d/c is to: Home              Patient currently is not medically stable to d/c.   Difficult to place patient No           Subjective:   Interval events noted.  Patient had episode of NSVT (on telemetry) that was associated with dizziness.  No chest pain or shortness of breath.  Objective:    Vitals:   06/17/21 2121 06/18/21 0019 06/18/21 0512 06/18/21 0942  BP:  130/71 105/64 125/86  Pulse: 66 70 77   Resp:  19 18   Temp:  98.8 F (37.1 C) 98.8 F (37.1 C)   TempSrc:  Oral Oral   SpO2:  100% 100%   Weight:      Height:       No data found.   Intake/Output Summary (Last 24 hours) at 06/18/2021 1254 Last data filed at 06/18/2021 1045 Gross per 24 hour  Intake 821.83 ml  Output 1950 ml  Net -1128.17 ml   Filed Weights   06/16/21 0748 06/16/21 1609  Weight: 75.8 kg 74.4 kg    Exam:  GEN: NAD SKIN: No rash EYES: EOMI ENT: MMM CV: RRR PULM: CTA B ABD: soft, ND, NT, +BS CNS: AAO x 3, non focal EXT: No edema or tenderness         Data Reviewed:    I have personally reviewed following labs and imaging studies:  Labs: Labs show the following:   Basic Metabolic Panel: Recent Labs  Lab 06/16/21 0800 06/16/21 1640 06/17/21 0245 06/18/21 0111 06/18/21 0948  NA 136  --  137 137  --   K 4.3  --  4.0 3.9  --   CL 106  --  105 107  --   CO2 22  --  23 21*  --   GLUCOSE 104*  --  93 99  --   BUN 17  --  18 14  --   CREATININE 1.32*  --  1.28* 1.31*  --   CALCIUM 8.8*  --  9.3 9.1  --   MG  --   --   --   --  1.9  PHOS  --  2.8  --   --   --    GFR Estimated Creatinine Clearance: 59.2 mL/min (A) (by C-G formula based on SCr of 1.31 mg/dL (H)). Liver Function Tests: No results for input(s): AST, ALT, ALKPHOS, BILITOT, PROT, ALBUMIN in the last 168 hours. No results for input(s): LIPASE, AMYLASE in the last 168 hours. No results for input(s): AMMONIA in the last 168 hours. Coagulation profile No results for input(s): INR, PROTIME in the last 168 hours.  CBC: Recent Labs  Lab 06/16/21 0800 06/18/21 0111  WBC 6.4 7.2  NEUTROABS 4.5  --   HGB 12.7* 13.0  HCT 37.9* 38.2*  MCV 84.4 83.4  PLT 168 138*   Cardiac Enzymes: No results for input(s): CKTOTAL, CKMB, CKMBINDEX, TROPONINI in the last 168 hours. BNP (last 3 results) No results for input(s): PROBNP in the last 8760 hours. CBG: No results for input(s): GLUCAP in the last 168 hours. D-Dimer: No results for input(s): DDIMER in the last 72 hours. Hgb A1c: No results for input(s): HGBA1C in the last 72 hours. Lipid Profile: No results for input(s): CHOL, HDL, LDLCALC, TRIG, CHOLHDL, LDLDIRECT in the last 72 hours. Thyroid function studies: No results for input(s): TSH, T4TOTAL, T3FREE, THYROIDAB in the last 72 hours.  Invalid input(s): FREET3 Anemia work up: No results for input(s): VITAMINB12, FOLATE, FERRITIN, TIBC, IRON, RETICCTPCT in the last 72 hours. Sepsis Labs: Recent Labs  Lab 06/16/21 0800 06/18/21 0111  WBC 6.4 7.2    Microbiology Recent  Results (from the past 240 hour(s))  Resp Panel by RT-PCR (Flu A&B, Covid) Nasopharyngeal Swab     Status: None   Collection Time: 06/16/21  8:02 PM  Specimen: Nasopharyngeal Swab; Nasopharyngeal(NP) swabs in vial transport medium  Result Value Ref Range Status   SARS Coronavirus 2 by RT PCR NEGATIVE NEGATIVE Final    Comment: (NOTE) SARS-CoV-2 target nucleic acids are NOT DETECTED.  The SARS-CoV-2 RNA is generally detectable in upper respiratory specimens during the acute phase of infection. The lowest concentration of SARS-CoV-2 viral copies this assay can detect is 138 copies/mL. A negative result does not preclude SARS-Cov-2 infection and should not be used as the sole basis for treatment or other patient management decisions. A negative result may occur with  improper specimen collection/handling, submission of specimen other than nasopharyngeal swab, presence of viral mutation(s) within the areas targeted by this assay, and inadequate number of viral copies(<138 copies/mL). A negative result must be combined with clinical observations, patient history, and epidemiological information. The expected result is Negative.  Fact Sheet for Patients:  EntrepreneurPulse.com.au  Fact Sheet for Healthcare Providers:  IncredibleEmployment.be  This test is no t yet approved or cleared by the [place] FDA and  has been authorized for detection and/or diagnosis of SARS-CoV-2 by FDA under an Emergency Use Authorization (EUA). This EUA will remain  in effect (meaning this test can be used) for the duration of the COVID-19 declaration under Section 564(b)(1) of the Act, 21 U.S.C.section 360bbb-3(b)(1), unless the authorization is terminated  or revoked sooner.       Influenza A by PCR NEGATIVE NEGATIVE Final   Influenza B by PCR NEGATIVE NEGATIVE Final    Comment: (NOTE) The Xpert Xpress SARS-CoV-2/FLU/RSV plus assay is intended as an aid in the  diagnosis of influenza from Nasopharyngeal swab specimens and should not be used as a sole basis for treatment. Nasal washings and aspirates are unacceptable for Xpert Xpress SARS-CoV-2/FLU/RSV testing.  Fact Sheet for Patients: EntrepreneurPulse.com.au  Fact Sheet for Healthcare Providers: IncredibleEmployment.be  This test is not yet approved or cleared by the [place] FDA and has been authorized for detection and/or diagnosis of SARS-CoV-2 by FDA under an Emergency Use Authorization (EUA). This EUA will remain in effect (meaning this test can be used) for the duration of the COVID-19 declaration under Section 564(b)(1) of the Act, 21 U.S.C. section 360bbb-3(b)(1), unless the authorization is terminated or revoked.  Performed at [facility], [place] [address], [place], [place] [address]     Procedures and diagnostic studies:  ECHOCARDIOGRAM COMPLETE  Result Date: 06/16/2021    ECHOCARDIOGRAM REPORT   Patient Name:   Nicholas [name] Date of Exam: 06/16/2021 Medical Rec #:  [id number]      Height:       71.0 in Accession #:    [id number]     Weight:       164.1 lb Date of Birth:  [birth year]      BSA:          1.939 m Patient Age:    6 years       BP:           138/71 mmHg Patient Gender: M              HR:           69 bpm. Exam Location:  Inpatient Procedure: 2D Echo, Cardiac Doppler and Color Doppler Indications:    Chest pain  History:        Patient has prior history of Echocardiogram examinations, most                 recent 06/11/2014.  CAD and Previous Myocardial Infarction, Prior                 CABG; Arrythmias:PVC.  Sonographer:    Merrie Roof RDCS Referring Phys: [place]  1. Frequent PVCs. Left ventricular ejection fraction, by estimation, is 45 to 50%. The left ventricle has mildly decreased function. The left ventricle demonstrates regional wall motion abnormalities. Akinesis of basal inferior wall. There is  moderate left ventricular hypertrophy. Left ventricular diastolic parameters are indeterminate.  2. Right ventricular systolic function is mildly reduced. The right ventricular size is normal. Tricuspid regurgitation signal is inadequate for assessing PA pressure.  3. The mitral valve is normal in structure. Mild mitral valve regurgitation. No evidence of mitral stenosis.  4. The aortic valve is tricuspid. Aortic valve regurgitation is trivial. No aortic stenosis is present.  5. The inferior vena cava is normal in size with greater than 50% respiratory variability, suggesting right atrial pressure of 3 mmHg. Conclusion(s)/Recommendation(s): Given frequent PVCs and thinning/akinesis in noncoronary distribution (localized to basal inferior wall), would consider sarcoidosis and recommend cardiac MRI for further evaluation. FINDINGS  Left Ventricle: Left ventricular ejection fraction, by estimation, is 45 to 50%. The left ventricle has mildly decreased function. The left ventricle demonstrates regional wall motion abnormalities. The left ventricular internal cavity size was normal in size. There is moderate left ventricular hypertrophy. Left ventricular diastolic parameters are indeterminate. Right Ventricle: The right ventricular size is normal. Right vetricular wall thickness was not well visualized. Right ventricular systolic function is mildly reduced. Tricuspid regurgitation signal is inadequate for assessing PA pressure. Left Atrium: Left atrial size was normal in size. Right Atrium: Right atrial size was normal in size. Pericardium: There is no evidence of pericardial effusion. Mitral Valve: The mitral valve is normal in structure. Mild mitral valve regurgitation. No evidence of mitral valve stenosis. Tricuspid Valve: The tricuspid valve is normal in structure. Tricuspid valve regurgitation is trivial. Aortic Valve: The aortic valve is tricuspid. Aortic valve regurgitation is trivial. No aortic stenosis is  present. Pulmonic Valve: The pulmonic valve was not well visualized. Pulmonic valve regurgitation is trivial. Aorta: The aortic root and ascending aorta are structurally normal, with no evidence of dilitation. Venous: The inferior vena cava is normal in size with greater than 50% respiratory variability, suggesting right atrial pressure of 3 mmHg. IAS/Shunts: The interatrial septum was not well visualized.  LEFT VENTRICLE PLAX 2D LVIDd:         4.90 cm LVIDs:         4.00 cm LV PW:         1.50 cm LV IVS:        1.50 cm LVOT diam:     2.10 cm LV SV:         60 LV SV Index:   31 LVOT Area:     3.46 cm  LV Volumes (MOD) LV vol d, MOD A4C: 126.0 ml LV vol s, MOD A4C: 51.6 ml LV SV MOD A4C:     126.0 ml RIGHT VENTRICLE RV Basal diam:  3.50 cm LEFT ATRIUM           Index       RIGHT ATRIUM           Index LA diam:      3.10 cm 1.60 cm/m  RA Area:     15.10 cm LA Vol (A4C): 42.8 ml 22.08 ml/m RA Volume:   36.30 ml  18.73 ml/m  AORTIC  VALVE LVOT Vmax:   101.00 cm/s LVOT Vmean:  68.700 cm/s LVOT VTI:    0.172 m  AORTA Ao Root diam: 3.50 cm Ao Asc diam:  3.40 cm  SHUNTS Systemic VTI:  0.17 m Systemic Diam: 2.10 cm Oswaldo Milian MD Electronically signed by Oswaldo Milian MD Signature Date/Time: 06/16/2021/10:35:35 PM    Final                LOS: 2 days   [place]  [facility]   Pager on [email or web address]. If 7PM-7AM, please contact night-coverage at [email or web address]     06/18/2021, 12:54 PM

## 2021-06-19 ENCOUNTER — Encounter (HOSPITAL_COMMUNITY)
Admission: EM | Disposition: A | Discharge status: Home / Self Care | Payer: Self-pay | Source: Home / Self Care | Attending: Internal Medicine

## 2021-06-19 DIAGNOSIS — I472 Ventricular tachycardia: Secondary | ICD-10-CM | POA: Diagnosis not present

## 2021-06-19 DIAGNOSIS — I25810 Atherosclerosis of coronary artery bypass graft(s) without angina pectoris: Secondary | ICD-10-CM | POA: Diagnosis not present

## 2021-06-19 DIAGNOSIS — I2510 Atherosclerotic heart disease of native coronary artery without angina pectoris: Secondary | ICD-10-CM | POA: Diagnosis not present

## 2021-06-19 DIAGNOSIS — I214 Non-ST elevation (NSTEMI) myocardial infarction: Secondary | ICD-10-CM | POA: Diagnosis not present

## 2021-06-19 HISTORY — PX: LEFT HEART CATH AND CORS/GRAFTS ANGIOGRAPHY: CATH118250

## 2021-06-19 LAB — BASIC METABOLIC PANEL
Anion gap: 6 (ref 5–15)
BUN: 13 mg/dL (ref 8–23)
CO2: 22 mmol/L (ref 22–32)
Calcium: 9.1 mg/dL (ref 8.9–10.3)
Chloride: 108 mmol/L (ref 98–111)
Creatinine, Ser: 1.41 mg/dL — ABNORMAL HIGH (ref 0.61–1.24)
GFR, Estimated: 55 mL/min — ABNORMAL LOW (ref >60)
Glucose, Bld: 115 mg/dL — ABNORMAL HIGH (ref 70–99)
Potassium: 4.1 mmol/L (ref 3.5–5.1)
Sodium: 136 mmol/L (ref 135–145)

## 2021-06-19 LAB — CBC
HCT: 36.1 % — ABNORMAL LOW (ref 39.0–52.0)
Hemoglobin: 12.4 g/dL — ABNORMAL LOW (ref 13.0–17.0)
MCH: 28.2 pg (ref 26.0–34.0)
MCHC: 34.3 g/dL (ref 30.0–36.0)
MCV: 82.2 fL (ref 80.0–100.0)
Platelets: 155 10*3/uL (ref 150–400)
RBC: 4.39 MIL/uL (ref 4.22–5.81)
RDW: 13.5 % (ref 11.5–15.5)
WBC: 6.3 10*3/uL (ref 4.0–10.5)
nRBC: 0 % (ref 0.0–0.2)

## 2021-06-19 LAB — HEPARIN LEVEL (UNFRACTIONATED): Heparin Unfractionated: 0.57 IU/mL (ref 0.30–0.70)

## 2021-06-19 LAB — MAGNESIUM: Magnesium: 2 mg/dL (ref 1.7–2.4)

## 2021-06-19 SURGERY — LEFT HEART CATH AND CORS/GRAFTS ANGIOGRAPHY
Anesthesia: LOCAL

## 2021-06-19 MED ORDER — SODIUM CHLORIDE 0.9% FLUSH: 3.0000 mL | INTRAVENOUS | Status: DC | PRN

## 2021-06-19 MED ORDER — MIDAZOLAM HCL 2 MG/2ML IJ SOLN
INTRAMUSCULAR | Status: AC
  Filled 2021-06-19: qty 2

## 2021-06-19 MED ORDER — HEPARIN SODIUM (PORCINE) 5000 UNIT/ML IJ SOLN
5000.0000 [IU] | Freq: Three times a day (TID) | INTRAMUSCULAR | Status: DC
  Administered 2021-06-19 – 2021-06-21 (×6): 5000 [IU] via SUBCUTANEOUS
  Filled 2021-06-19 (×6): qty 1

## 2021-06-19 MED ORDER — FENTANYL CITRATE (PF) 100 MCG/2ML IJ SOLN
INTRAMUSCULAR | Status: AC
  Filled 2021-06-19: qty 2

## 2021-06-19 MED ORDER — HEPARIN SODIUM (PORCINE) 1000 UNIT/ML IJ SOLN
INTRAMUSCULAR | Status: AC
  Filled 2021-06-19: qty 1

## 2021-06-19 MED ORDER — HYDRALAZINE HCL 20 MG/ML IJ SOLN: 10.0000 mg | INTRAMUSCULAR | Status: AC | PRN

## 2021-06-19 MED ORDER — SODIUM CHLORIDE 0.9 % IV SOLN: 250.0000 mL | INTRAVENOUS | Status: DC | PRN

## 2021-06-19 MED ORDER — SODIUM CHLORIDE 0.9 % IV SOLN: INTRAVENOUS | Status: AC

## 2021-06-19 MED ORDER — OXYCODONE HCL 5 MG PO TABS
5.0000 mg | ORAL_TABLET | ORAL | Status: DC | PRN
Start: 2021-06-19 — End: 2021-06-21

## 2021-06-19 MED ORDER — VERAPAMIL HCL 2.5 MG/ML IV SOLN
INTRAVENOUS | Status: DC | PRN
  Administered 2021-06-19: 10 mL via INTRA_ARTERIAL

## 2021-06-19 MED ORDER — LIDOCAINE HCL (PF) 1 % IJ SOLN
INTRAMUSCULAR | Status: DC | PRN
  Administered 2021-06-19: 2 mL

## 2021-06-19 MED ORDER — FENTANYL CITRATE (PF) 100 MCG/2ML IJ SOLN
INTRAMUSCULAR | Status: DC | PRN
  Administered 2021-06-19: 50 ug via INTRAVENOUS

## 2021-06-19 MED ORDER — MELATONIN 3 MG PO TABS
3.0000 mg | ORAL_TABLET | Freq: Every day | ORAL | Status: DC
  Administered 2021-06-19 – 2021-06-20 (×2): 3 mg via ORAL
  Filled 2021-06-19: qty 1

## 2021-06-19 MED ORDER — LABETALOL HCL 5 MG/ML IV SOLN: 10.0000 mg | INTRAVENOUS | Status: AC | PRN

## 2021-06-19 MED ORDER — ONDANSETRON HCL 4 MG/2ML IJ SOLN: 4.0000 mg | Freq: Four times a day (QID) | INTRAMUSCULAR | Status: DC | PRN

## 2021-06-19 MED ORDER — HEPARIN SODIUM (PORCINE) 1000 UNIT/ML IJ SOLN
INTRAMUSCULAR | Status: DC | PRN
  Administered 2021-06-19: 4000 [IU] via INTRAVENOUS

## 2021-06-19 MED ORDER — ASPIRIN 81 MG PO CHEW
81.0000 mg | CHEWABLE_TABLET | Freq: Every day | ORAL | Status: DC
  Administered 2021-06-20 – 2021-06-21 (×2): 81 mg via ORAL
  Filled 2021-06-19 (×2): qty 1

## 2021-06-19 MED ORDER — VERAPAMIL HCL 2.5 MG/ML IV SOLN
INTRAVENOUS | Status: AC
  Filled 2021-06-19: qty 2

## 2021-06-19 MED ORDER — HEPARIN (PORCINE) IN NACL 1000-0.9 UT/500ML-% IV SOLN
INTRAVENOUS | Status: DC | PRN
  Administered 2021-06-19 (×2): 500 mL

## 2021-06-19 MED ORDER — LIDOCAINE HCL (PF) 1 % IJ SOLN
INTRAMUSCULAR | Status: AC
  Filled 2021-06-19: qty 30

## 2021-06-19 MED ORDER — IOHEXOL 350 MG/ML SOLN
INTRAVENOUS | Status: DC | PRN
  Administered 2021-06-19: 170 mL

## 2021-06-19 MED ORDER — SODIUM CHLORIDE 0.9% FLUSH
3.0000 mL | Freq: Two times a day (BID) | INTRAVENOUS | Status: DC
  Administered 2021-06-19 – 2021-06-21 (×4): 3 mL via INTRAVENOUS

## 2021-06-19 MED ORDER — ACETAMINOPHEN 325 MG PO TABS: 650.0000 mg | ORAL_TABLET | ORAL | Status: DC | PRN

## 2021-06-19 MED ORDER — HEPARIN (PORCINE) IN NACL 1000-0.9 UT/500ML-% IV SOLN
INTRAVENOUS | Status: AC
  Filled 2021-06-19: qty 1000

## 2021-06-19 MED ORDER — MIDAZOLAM HCL 2 MG/2ML IJ SOLN
INTRAMUSCULAR | Status: DC | PRN
  Administered 2021-06-19 (×3): 1 mg via INTRAVENOUS

## 2021-06-19 SURGICAL SUPPLY — 13 items
CATH INFINITI 5FR MPB2 (CATHETERS) ×1 IMPLANT
CATH INFINITI 5FR MULTPACK ANG (CATHETERS) ×1 IMPLANT
CATH LAUNCHER 5F RADR (CATHETERS) IMPLANT
CATH OPTITORQUE TIG 4.0 5F (CATHETERS) ×1 IMPLANT
CATHETER LAUNCHER 5F RADR (CATHETERS) ×2
DEVICE RAD COMP TR BAND LRG (VASCULAR PRODUCTS) ×1 IMPLANT
GLIDESHEATH SLEND A-KIT 6F 22G (SHEATH) ×1 IMPLANT
GUIDEWIRE INQWIRE 1.5J.035X260 (WIRE) IMPLANT
INQWIRE 1.5J .035X260CM (WIRE) ×2
KIT HEART LEFT (KITS) ×2 IMPLANT
PACK CARDIAC CATHETERIZATION (CUSTOM PROCEDURE TRAY) ×2 IMPLANT
TRANSDUCER W/STOPCOCK (MISCELLANEOUS) ×2 IMPLANT
TUBING CIL FLEX 10 FLL-RA (TUBING) ×2 IMPLANT

## 2021-06-19 NOTE — H&P (View-Only) (Signed)
Progress Note  Patient Name: Nicholas [name] Date of Encounter: 06/19/2021  [facility] Cardiologist: Dr Marlou Porch  Subjective   No dizziness no chest pain for cath latter today   Inpatient Medications    Scheduled Meds:  alfuzosin  10 mg Oral q1800   aspirin EC  81 mg Oral Daily   melatonin  3 mg Oral QHS   metoprolol tartrate  25 mg Oral BID   rosuvastatin  40 mg Oral q1800   sodium chloride flush  3 mL Intravenous Q12H   Continuous Infusions:  sodium chloride     sodium chloride 10 mL/hr at 06/19/21 0641   amiodarone 30 mg/hr (06/18/21 2229)   heparin 1,150 Units/hr (06/19/21 0232)   PRN Meds:    Vital Signs    Vitals:   06/18/21 0942 06/18/21 1300 06/18/21 2013 06/19/21 0436  BP: 125/86 136/75 (!) 105/49 101/64  Pulse:  65 60 (!) 57  Resp:  '16 19 15  '$ Temp:  98.9 F (37.2 C) 98.5 F (36.9 C) 98.4 F (36.9 C)  TempSrc:  Oral Oral Oral  SpO2:  100% 100% 98%  Weight:      Height:        Intake/Output Summary (Last 24 hours) at 06/19/2021 0754 Last data filed at 06/19/2021 [id number] Gross per 24 hour  Intake 1146.08 ml  Output 1450 ml  Net -303.92 ml   Last 3 Weights 06/16/2021 06/16/2021 02/21/2018  Weight (lbs) 164 lb 1.6 oz 167 lb 1.7 oz 167 lb  Weight (kg) 74.435 kg 75.8 kg 75.751 kg      Telemetry    06/19/2021 NSR with PVC;s no NSVT this am    Physical Exam   Affect appropriate Healthy:  appears stated age HEENT: normal Neck supple with no adenopathy JVP normal no bruits no thyromegaly Lungs clear with no wheezing and good diaphragmatic motion Heart:  S1/S2 no murmur, no rub, gallop or click PMI normal Abdomen: benighn, BS positve, no tenderness, no AAA no bruit.  No HSM or HJR Distal pulses intact with no bruits No edema Neuro non-focal Skin warm and dry No muscular weakness   Labs    High Sensitivity Troponin:   Recent Labs  Lab 06/16/21 0800 06/16/21 1015 06/16/21 1640 06/17/21 0722 06/17/21 1025  TROPONINIHS 12 47* 292* 136*  113*      Chemistry Recent Labs  Lab 06/17/21 0245 06/18/21 0111 06/19/21 0225  NA 137 137 136  K 4.0 3.9 4.1  CL 105 107 108  CO2 23 21* 22  GLUCOSE 93 99 115*  BUN '18 14 13  '$ CREATININE 1.28* 1.31* 1.41*  CALCIUM 9.3 9.1 9.1  GFRNONAA >60 >60 55*  ANIONGAP '9 9 6     '$ Hematology Recent Labs  Lab 06/16/21 0800 06/18/21 0111 06/19/21 0225  WBC 6.4 7.2 6.3  RBC 4.49 4.58 4.39  HGB 12.7* 13.0 12.4*  HCT 37.9* 38.2* 36.1*  MCV 84.4 83.4 82.2  MCH 28.3 28.4 28.2  MCHC 33.5 34.0 34.3  RDW 14.0 13.7 13.5  PLT 168 138* 155     Radiology    No results found.   Patient Profile      65 year old male with past medical history of coronary artery disease status post coronary artery bypass graft, hypertension, hyperlipidemia for evaluation of PVCs and dizziness.  Echocardiogram shows ejection fraction 45 to 50%, inferior hypokinesis, moderate left ventricular hypertrophy, mild RV dysfunction, mild mitral regurgitation, trace aortic insufficiency.  Pattern suggestive of sarcoid and cardiac MRI  recommended.  Patient with recurrent episode of lightheadedness and telemetry documented this was associated with ventricular tachycardia.  Assessment & Plan    1 non-ST elevation myocardial infarction-patient remains pain-free; however noted to have recurrent nonsustained ventricular tachycardia that is symptomatic.  Continue aspirin, heparin, statin and beta-blockade.  Plan for cardiac catheterization tomorrow.  The risk and benefits including myocardial infarction, CVA and death previously discussed and he agrees to proceed.  Called cath lab and hopefully cath before noon If no intervention done will need EP consult regarding ventricular arrhythmia   2 dizziness-patient with recurrent episode of lightheadedness/palpitations this morning and telemetry shows nonsustained ventricular tachycardia.  As outlined above plan cardiac catheterization to exclude ischemia mediated VT.  If no lesion  identified will need cardiac MRI in the hospital as echocardiogram suggests possible cardiac sarcoid.  Continue beta-blocker.  Add IV amiodarone.  Will need electrophysiology consult pending results of catheterization.  Note potassium is 3.9.  Check magnesium for completeness.   3 hypertension-continue metoprolol and follow.   4 coronary artery disease-continue aspirin and statin.   5 hyperlipidemia-continue Crestor.  Check lipids and liver in 12 weeks.  For questions or updates, please contact [place] Please consult [email or web address] for contact info under        Signed, Jenkins Rouge, MD  06/19/2021, 7:54 AM   Patient ID: [name], male   DOB: Jul 01, 1956, 65 y.o.   MRN: [id number]

## 2021-06-19 NOTE — Plan of Care (Signed)

## 2021-06-19 NOTE — Progress Notes (Signed)
PROGRESS NOTE    Nicholas [name]  [id number] DOB: 1956-05-06 DOA: 06/16/2021 PCP: Alroy Dust, L.Marlou Sa, MD   Chief Complain: Chest pain, dizziness  Brief Narrative: Patient is a 65 year old male with history of coronary artery disease status post CABG, hypertension, hyperlipidemia, sinus bradycardia, PVCs who presented with chest pain, dizziness.  Found to have elevated troponin.  Admitted for the management of acute NSTEMI.  Started on heparin drip.  Hospital course remarkable for nonsustained V. tach, also started on IV amiodarone infusion.  Cardiology consulted.  Plan for cardiac cath today.  Assessment & Plan:   Principal Problem:   NSTEMI (non-ST elevated myocardial infarction) [facility]) Active Problems:   CAD- CABG x 3 in [place] '03   Dyslipidemia   Angina at rest [facility])   NSVT (nonsustained ventricular tachycardia) ([place])   Acute NSTEMI/H/O CAD: Presented with chest pain, dizziness.  Elevated troponin on presentation.  Started on IV heparin.  Continue aspirin, statin, metoprolol.  Plan for cardiac cath today.  Frequent PVCs/ventricular bigeminy/nonsustained V. tach with dizziness: On amiodarone infusion.  Also on metoprolol.  Echo showed EF of 45 to 40%, moderate LVH, indeterminate left ventricular parameters.  There was suspicion for cardiac sarcoid as per echo.  Cardiology considering cardiac MRI if cardiac cath does not reveal significant finding.  There is also plan for potential EP consult as per cardiology. Monitor electrolytes  Hypertension: Continue current medications.  Blood pressure stable.  Monitor blood pressure  Hyperlipidemia: On statin  Stage II CKD: Currently kidney function at baseline.          DVT prophylaxis:Heparin IV Code Status: Full Family Communication: family member present at bedside Status is: Inpatient  Remains inpatient appropriate because:Inpatient level of care appropriate due to severity of illness  Dispo: The patient is from: Home               Anticipated d/c is to: Home              Patient currently is not medically stable to d/c.   Difficult to place patient No     Consultants:Cardiology  Procedures:Plan for cath  Antimicrobials:  Anti-infectives (From admission, onward)    None       Subjective: Patient seen and examined the bedside this morning.   Lying on bed.  Denies chest pain during my evaluation.  Hemodynamically stable  Objective: Vitals:   06/18/21 1300 06/18/21 2013 06/19/21 0436 06/19/21 1229  BP: 136/75 (!) 105/49 101/64   Pulse: 65 60 (!) 57   Resp: '16 19 15   '$ Temp: 98.9 F (37.2 C) 98.5 F (36.9 C) 98.4 F (36.9 C)   TempSrc: Oral Oral Oral   SpO2: 100% 100% 98% 100%  Weight:      Height:        Intake/Output Summary (Last 24 hours) at 06/19/2021 1301 Last data filed at 06/19/2021 0648 Gross per 24 hour  Intake 1146.08 ml  Output 1025 ml  Net 121.08 ml   Filed Weights   06/16/21 0748 06/16/21 1609  Weight: 75.8 kg 74.4 kg    Examination:  General exam: Overall comfortable, not in distress HEENT: PERRL Respiratory system:  no wheezes or crackles  Cardiovascular system: S1 & S2 heard, RRR.  Gastrointestinal system: Abdomen is nondistended, soft and nontender. Central nervous system: Alert and oriented Extremities: No edema, no clubbing ,no cyanosis Skin: No rashes, no ulcers,no icterus  .     Data Reviewed: I have personally reviewed following labs and imaging studies  CBC: Recent Labs  Lab 06/16/21 0800 06/18/21 0111 06/19/21 0225  WBC 6.4 7.2 6.3  NEUTROABS 4.5  --   --   HGB 12.7* 13.0 12.4*  HCT 37.9* 38.2* 36.1*  MCV 84.4 83.4 82.2  PLT 168 138* [id number]   Basic Metabolic Panel: Recent Labs  Lab 06/16/21 0800 06/16/21 1640 06/17/21 0245 06/18/21 0111 06/18/21 0948 06/19/21 0225  NA 136  --  137 137  --  136  K 4.3  --  4.0 3.9  --  4.1  CL 106  --  105 107  --  108  CO2 22  --  23 21*  --  22  GLUCOSE 104*  --  93 99  --  115*  BUN 17  --   18 14  --  13  CREATININE 1.32*  --  1.28* 1.31*  --  1.41*  CALCIUM 8.8*  --  9.3 9.1  --  9.1  MG  --   --   --   --  1.9 2.0  PHOS  --  2.8  --   --   --   --    GFR: Estimated Creatinine Clearance: 55 mL/min (A) (by C-G formula based on SCr of 1.41 mg/dL (H)). Liver Function Tests: No results for input(s): AST, ALT, ALKPHOS, BILITOT, PROT, ALBUMIN in the last 168 hours. No results for input(s): LIPASE, AMYLASE in the last 168 hours. No results for input(s): AMMONIA in the last 168 hours. Coagulation Profile: No results for input(s): INR, PROTIME in the last 168 hours. Cardiac Enzymes: No results for input(s): CKTOTAL, CKMB, CKMBINDEX, TROPONINI in the last 168 hours. BNP (last 3 results) No results for input(s): PROBNP in the last 8760 hours. HbA1C: No results for input(s): HGBA1C in the last 72 hours. CBG: No results for input(s): GLUCAP in the last 168 hours. Lipid Profile: No results for input(s): CHOL, HDL, LDLCALC, TRIG, CHOLHDL, LDLDIRECT in the last 72 hours. Thyroid Function Tests: No results for input(s): TSH, T4TOTAL, FREET4, T3FREE, THYROIDAB in the last 72 hours. Anemia Panel: No results for input(s): VITAMINB12, FOLATE, FERRITIN, TIBC, IRON, RETICCTPCT in the last 72 hours. Sepsis Labs: No results for input(s): PROCALCITON, LATICACIDVEN in the last 168 hours.  Recent Results (from the past 240 hour(s))  Resp Panel by RT-PCR (Flu A&B, Covid) Nasopharyngeal Swab     Status: None   Collection Time: 06/16/21  8:02 PM   Specimen: Nasopharyngeal Swab; Nasopharyngeal(NP) swabs in vial transport medium  Result Value Ref Range Status   SARS Coronavirus 2 by RT PCR NEGATIVE NEGATIVE Final    Comment: (NOTE) SARS-CoV-2 target nucleic acids are NOT DETECTED.  The SARS-CoV-2 RNA is generally detectable in upper respiratory specimens during the acute phase of infection. The lowest concentration of SARS-CoV-2 viral copies this assay can detect is 138 copies/mL. A negative  result does not preclude SARS-Cov-2 infection and should not be used as the sole basis for treatment or other patient management decisions. A negative result may occur with  improper specimen collection/handling, submission of specimen other than nasopharyngeal swab, presence of viral mutation(s) within the areas targeted by this assay, and inadequate number of viral copies(<138 copies/mL). A negative result must be combined with clinical observations, patient history, and epidemiological information. The expected result is Negative.  Fact Sheet for Patients:  EntrepreneurPulse.com.au  Fact Sheet for Healthcare Providers:  IncredibleEmployment.be  This test is no t yet approved or cleared by the [place] and  has been authorized  for detection and/or diagnosis of SARS-CoV-2 by FDA under an Emergency Use Authorization (EUA). This EUA will remain  in effect (meaning this test can be used) for the duration of the COVID-19 declaration under Section 564(b)(1) of the Act, 21 U.S.C.section 360bbb-3(b)(1), unless the authorization is terminated  or revoked sooner.       Influenza A by PCR NEGATIVE NEGATIVE Final   Influenza B by PCR NEGATIVE NEGATIVE Final    Comment: (NOTE) The Xpert Xpress SARS-CoV-2/FLU/RSV plus assay is intended as an aid in the diagnosis of influenza from Nasopharyngeal swab specimens and should not be used as a sole basis for treatment. Nasal washings and aspirates are unacceptable for Xpert Xpress SARS-CoV-2/FLU/RSV testing.  Fact Sheet for Patients: EntrepreneurPulse.com.au  Fact Sheet for Healthcare Providers: IncredibleEmployment.be  This test is not yet approved or cleared by the [place] FDA and has been authorized for detection and/or diagnosis of SARS-CoV-2 by FDA under an Emergency Use Authorization (EUA). This EUA will remain in effect (meaning this test can be used)  for the duration of the COVID-19 declaration under Section 564(b)(1) of the Act, 21 U.S.C. section 360bbb-3(b)(1), unless the authorization is terminated or revoked.  Performed at [facility], [place] [address], [place], [place] [address]          Radiology Studies: No results found.      Scheduled Meds:  [MAR Hold] alfuzosin  10 mg Oral q1800   [MAR Hold] aspirin EC  81 mg Oral Daily   [MAR Hold] melatonin  3 mg Oral QHS   [MAR Hold] metoprolol tartrate  25 mg Oral BID   [MAR Hold] rosuvastatin  40 mg Oral q1800   sodium chloride flush  3 mL Intravenous Q12H   Continuous Infusions:  sodium chloride     sodium chloride 10 mL/hr at 06/19/21 0641   amiodarone 30 mg/hr (06/19/21 0859)   heparin 1,150 Units/hr (06/19/21 0232)     LOS: 3 days    Time spent: 25 mins,More than 50% of that time was spent in counseling and/or coordination of care.      Shelly Coss, MD [facility] P9/09/2021, 1:01 PM

## 2021-06-19 NOTE — Progress Notes (Signed)
ANTICOAGULATION CONSULT NOTE - Follow-Up Consult  Pharmacy Consult for Heparin Indication: chest pain/ACS, NSTEMI   No Known Allergies  Patient Measurements: Height: '5\' 11"'$  (180.3 cm) Weight: 74.4 kg (164 lb 1.6 oz) IBW/kg (Calculated) : 75.3 Heparin Dosing Weight: 74.4 kg  Vital Signs: Temp: 98.4 F (36.9 C) (09/12 0436) Temp Source: Oral (09/12 0436) BP: 101/64 (09/12 0436) Pulse Rate: 57 (09/12 0436)  Labs: Recent Labs    06/16/21 1640 06/17/21 0245 06/17/21 0722 06/17/21 1025 06/17/21 1530 06/18/21 0111 06/18/21 0948 06/18/21 1907 06/19/21 0225  HGB  --   --   --   --   --  13.0  --   --  12.4*  HCT  --   --   --   --   --  38.2*  --   --  36.1*  PLT  --   --   --   --   --  138*  --   --  155  HEPARINUNFRC  --   --   --   --    < > 0.12* 0.66 0.60 0.57  CREATININE  --  1.28*  --   --   --  1.31*  --   --  1.41*  TROPONINIHS 292*  --  136* 113*  --   --   --   --   --    < > = values in this interval not displayed.     Estimated Creatinine Clearance: 55 mL/min (A) (by C-G formula based on SCr of 1.41 mg/dL (H)).   Medical History: Past Medical History:  Diagnosis Date   Arthritis    knees, hands   Cataract    Coronary artery disease    Dysrhythmia 06/2014   sinus brady with PVC'S   GERD (gastroesophageal reflux disease)    Hepatitis C    Hx of adenomatous colonic polyps 09/21/2014   Hypercholesteremia    Hypertension    Myocardial infarction [facility])     Medications:  Scheduled:   alfuzosin  10 mg Oral q1800   aspirin EC  81 mg Oral Daily   melatonin  3 mg Oral QHS   metoprolol tartrate  25 mg Oral BID   rosuvastatin  40 mg Oral q1800   sodium chloride flush  3 mL Intravenous Q12H    Assessment: 65 yo male presenting with NSTEMI.  Patient has history of CAD s/p CABG (2003).  Patient was not on anticoagulation PTA.  Pharmacy consulted for heparin dosing.  Plans noted for cardiac cath on 9/12.   Heparin level therapeutic at 0.57, CBC wnl, cath  later today.   Goal of Therapy:  Heparin level 0.3-0.7 units/ml Monitor platelets by anticoagulation protocol: Yes   Plan:  Continue heparion 1150/h Daily heparin level and CBC  Arrie Senate, PharmD, BCPS, [facility] Clinical Pharmacist [phone] Please check AMION for all [facility] numbers 06/19/2021

## 2021-06-19 NOTE — Progress Notes (Signed)
Progress Note  Patient Name: Nicholas [name] Date of Encounter: 06/19/2021  [facility] Cardiologist: Dr Marlou Porch  Subjective   No dizziness no chest pain for cath latter today   Inpatient Medications    Scheduled Meds:  alfuzosin  10 mg Oral q1800   aspirin EC  81 mg Oral Daily   melatonin  3 mg Oral QHS   metoprolol tartrate  25 mg Oral BID   rosuvastatin  40 mg Oral q1800   sodium chloride flush  3 mL Intravenous Q12H   Continuous Infusions:  sodium chloride     sodium chloride 10 mL/hr at 06/19/21 0641   amiodarone 30 mg/hr (06/18/21 2229)   heparin 1,150 Units/hr (06/19/21 0232)   PRN Meds:    Vital Signs    Vitals:   06/18/21 0942 06/18/21 1300 06/18/21 2013 06/19/21 0436  BP: 125/86 136/75 (!) 105/49 101/64  Pulse:  65 60 (!) 57  Resp:  '16 19 15  '$ Temp:  98.9 F (37.2 C) 98.5 F (36.9 C) 98.4 F (36.9 C)  TempSrc:  Oral Oral Oral  SpO2:  100% 100% 98%  Weight:      Height:        Intake/Output Summary (Last 24 hours) at 06/19/2021 0754 Last data filed at 06/19/2021 [id number] Gross per 24 hour  Intake 1146.08 ml  Output 1450 ml  Net -303.92 ml   Last 3 Weights 06/16/2021 06/16/2021 02/21/2018  Weight (lbs) 164 lb 1.6 oz 167 lb 1.7 oz 167 lb  Weight (kg) 74.435 kg 75.8 kg 75.751 kg      Telemetry    06/19/2021 NSR with PVC;s no NSVT this am    Physical Exam   Affect appropriate Healthy:  appears stated age HEENT: normal Neck supple with no adenopathy JVP normal no bruits no thyromegaly Lungs clear with no wheezing and good diaphragmatic motion Heart:  S1/S2 no murmur, no rub, gallop or click PMI normal Abdomen: benighn, BS positve, no tenderness, no AAA no bruit.  No HSM or HJR Distal pulses intact with no bruits No edema Neuro non-focal Skin warm and dry No muscular weakness   Labs    High Sensitivity Troponin:   Recent Labs  Lab 06/16/21 0800 06/16/21 1015 06/16/21 1640 06/17/21 0722 06/17/21 1025  TROPONINIHS 12 47* 292* 136*  113*      Chemistry Recent Labs  Lab 06/17/21 0245 06/18/21 0111 06/19/21 0225  NA 137 137 136  K 4.0 3.9 4.1  CL 105 107 108  CO2 23 21* 22  GLUCOSE 93 99 115*  BUN '18 14 13  '$ CREATININE 1.28* 1.31* 1.41*  CALCIUM 9.3 9.1 9.1  GFRNONAA >60 >60 55*  ANIONGAP '9 9 6     '$ Hematology Recent Labs  Lab 06/16/21 0800 06/18/21 0111 06/19/21 0225  WBC 6.4 7.2 6.3  RBC 4.49 4.58 4.39  HGB 12.7* 13.0 12.4*  HCT 37.9* 38.2* 36.1*  MCV 84.4 83.4 82.2  MCH 28.3 28.4 28.2  MCHC 33.5 34.0 34.3  RDW 14.0 13.7 13.5  PLT 168 138* 155     Radiology    No results found.   Patient Profile      64 year old male with past medical history of coronary artery disease status post coronary artery bypass graft, hypertension, hyperlipidemia for evaluation of PVCs and dizziness.  Echocardiogram shows ejection fraction 45 to 50%, inferior hypokinesis, moderate left ventricular hypertrophy, mild RV dysfunction, mild mitral regurgitation, trace aortic insufficiency.  Pattern suggestive of sarcoid and cardiac MRI  recommended.  Patient with recurrent episode of lightheadedness and telemetry documented this was associated with ventricular tachycardia.  Assessment & Plan    1 non-ST elevation myocardial infarction-patient remains pain-free; however noted to have recurrent nonsustained ventricular tachycardia that is symptomatic.  Continue aspirin, heparin, statin and beta-blockade.  Plan for cardiac catheterization tomorrow.  The risk and benefits including myocardial infarction, CVA and death previously discussed and he agrees to proceed.  Called cath lab and hopefully cath before noon If no intervention done will need EP consult regarding ventricular arrhythmia   2 dizziness-patient with recurrent episode of lightheadedness/palpitations this morning and telemetry shows nonsustained ventricular tachycardia.  As outlined above plan cardiac catheterization to exclude ischemia mediated VT.  If no lesion  identified will need cardiac MRI in the hospital as echocardiogram suggests possible cardiac sarcoid.  Continue beta-blocker.  Add IV amiodarone.  Will need electrophysiology consult pending results of catheterization.  Note potassium is 3.9.  Check magnesium for completeness.   3 hypertension-continue metoprolol and follow.   4 coronary artery disease-continue aspirin and statin.   5 hyperlipidemia-continue Crestor.  Check lipids and liver in 12 weeks.  For questions or updates, please contact [place] Please consult [email or web address] for contact info under        Signed, Jenkins Rouge, MD  06/19/2021, 7:54 AM   Patient ID: Nicholas [name], male   DOB: 1956/09/24, 65 y.o.   MRN: [id number]

## 2021-06-19 NOTE — Consult Note (Signed)
Electrophysiology Consultation:   Patient ID: Nicholas [name] MRN: [id number]; DOB: Sep 22, 1956  Admit date: 06/16/2021 Date of Consult: 06/19/2021  PCP:  Alroy Dust, L.Marlou Sa, MD   [place] [facility] Cardiologist:  Dr. Marlou Porch (2017)   Patient Profile:   Nicholas [name] is a 65 y.o. male with a hx of MI, CAD (CABG 2003), HLT, HLD, PVCs,  who is being seen 06/19/2021 for the evaluation of PVCs, NSVT at the request of Dr. Johnsie Cancel.  History of Present Illness:   Nicholas [name] last saw Dr. Marlou Porch back in 2017, was doing well, discussed back then hx of presyncope associated with emotional upset or leaning forward (perhaps more of a historical symptom by then), he was doing well with plans for annual follow up, though lost to f/u after that.  He was admitted to [facility] 06/16/21, calling EMS with a couple episodes of acute onset of dizziness, lightheadedness, and chest discomfort.  EMS found him in [place] [facility] consulted, planned to follow trop and add on BB Dx with NSTEMI, planned for cath Over the weekend had some NSVT associated with NSVT and amiodarone was added/started.  TTE noted LVEF 45-50% with WMA, RVEF mildly reduced LHC full report below with CAD, no interventions  LABS K+ >>> 4.1 Mag 1.9, 2.0 BUN/Creat 17/1.32 >>> HS trop 12 > 47 > 292 > 136 . 113 WBC 6.3 H/H 12/36 Plts 155   Past Medical History:  Diagnosis Date   Arthritis    knees, hands   Cataract    Coronary artery disease    Dysrhythmia 06/2014   sinus brady with PVC'S   GERD (gastroesophageal reflux disease)    Hepatitis C    Hx of adenomatous colonic polyps 09/21/2014   Hypercholesteremia    Hypertension    Myocardial infarction [facility])     Past Surgical History:  Procedure Laterality Date   CARDIAC SURGERY     triple bypass   COLONOSCOPY     CORONARY ARTERY BYPASS GRAFT     CABG x 3 Vessels     Home Medications:  Prior to Admission medications   Medication Sig Start Date End Date Taking?  Authorizing Provider  alfuzosin (UROXATRAL) 10 MG 24 hr tablet Take 1 tablet (10 mg total) by mouth daily. 05/17/21  Yes   aspirin EC 81 MG tablet Take 81 mg by mouth daily with breakfast.   Yes [provider]  hydrochlorothiazide (HYDRODIURIL) 25 MG tablet TAKE 1/2 TABLET BY MOUTH DAILY. Patient taking differently: Take 12.5 mg by mouth daily. 07/02/17  Yes Jerline Pain, MD  ibuprofen (ADVIL,MOTRIN) 600 MG tablet Take 1 tablet (600 mg total) by mouth every 6 (six) hours as needed. 04/13/15  Yes Antonietta Breach, PA-C  naproxen (NAPROSYN) 500 MG tablet Take 1 tablet (500 mg total) by mouth 2 (two) times daily with a meal. 04/20/21  Yes Regal, Tamala Fothergill, DPM  nitroGLYCERIN (NITROSTAT) 0.4 MG SL tablet Place 1 tablet (0.4 mg total) under the tongue every 5 (five) minutes x 3 doses as needed for chest pain. 05/22/16  Yes Jerline Pain, MD  simvastatin (ZOCOR) 40 MG tablet Take 1 tablet (40 mg total) by mouth every evening. 03/22/20  Yes Mitchell, L.Marlou Sa, MD  diclofenac (VOLTAREN) 75 MG EC tablet Take 1 tablet (75 mg total) by mouth 2 (two) times daily. Patient not taking: Reported on 06/16/2021 06/22/19   Wallene Huh, DPM  hydrochlorothiazide (HYDRODIURIL) 25 MG tablet TAKE 1/2 TABLET BY MOUTH ONCE DAILY Patient not taking:  Reported on 06/16/2021 01/05/21 01/05/22  Alroy Dust, L.Marlou Sa, MD  hydrochlorothiazide (HYDRODIURIL) 25 MG tablet Take 0.5 tablets (12.5 mg total) by mouth daily. Patient not taking: Reported on 06/16/2021 04/19/21     pantoprazole (PROTONIX) 40 MG tablet Take 1 tablet (40 mg total) by mouth daily as needed. Patient not taking: Reported on 06/16/2021 03/13/21     predniSONE (DELTASONE) 10 MG tablet TAKE 2 TABLETS (20 MG TOTAL) BY MOUTH DAILY FOR 4 DAYS. Patient not taking: Reported on 06/16/2021 01/06/21 01/06/22  Raspet, Derry Skill, PA-C  simvastatin (ZOCOR) 40 MG tablet TAKE 1 TABLET (40 MG TOTAL) BY MOUTH EVERY EVENING. Patient not taking: Reported on 06/16/2021 07/02/17   Jerline Pain, MD     Inpatient Medications: Scheduled Meds:  alfuzosin  10 mg Oral q1800   aspirin EC  81 mg Oral Daily   melatonin  3 mg Oral QHS   metoprolol tartrate  25 mg Oral BID   rosuvastatin  40 mg Oral q1800   Continuous Infusions:  amiodarone 30 mg/hr (06/19/21 0859)   PRN Meds:   Allergies:   No Known Allergies  Social History:   Social History   Socioeconomic History   Marital status: Married    Spouse name: Not on file   Number of children: Not on file   Years of education: Not on file   Highest education level: Not on file  Occupational History   Not on file  Tobacco Use   Smoking status: Former    Types: Cigarettes    Quit date: 06/10/2002    Years since quitting: 19.0   Smokeless tobacco: Never  Vaping Use   Vaping Use: Never used  Substance and Sexual Activity   Alcohol use: Yes    Alcohol/week: 7.0 standard drinks    Types: 7 Cans of beer per week   Drug use: Yes    Types: Marijuana    Comment: occ   Sexual activity: Yes  Other Topics Concern   Not on file  Social History Narrative   Not on file   Social Determinants of Health   Financial Resource Strain: Not on file  Food Insecurity: Not on file  Transportation Needs: Not on file  Physical Activity: Not on file  Stress: Not on file  Social Connections: Not on file  Intimate Partner Violence: Not on file    Family History:   Family History  Problem Relation Age of Onset   Diabetic kidney disease Mother    Kidney disease Mother    Throat cancer Mother    Cancer Mother    Diabetes Mother    Hypertension Mother    Esophageal cancer Mother    Other Father        unknown   Hypertension Father    Heart attack Neg Hx    Colon cancer Neg Hx    Stomach cancer Neg Hx    Rectal cancer Neg Hx    Colon polyps Neg Hx      ROS:  Please see the history of present illness.  All other ROS reviewed and negative.     Physical Exam/Data:   Vitals:   06/19/21 1317 06/19/21 1322 06/19/21 1327 06/19/21  1332  BP: 125/75 120/77 117/76 124/74  Pulse: (!) 56 (!) 49 (!) 58 (!) 46  Resp: '11 18 20 '$ (!) 5  Temp:      TempSrc:      SpO2: 100% 100% 100% (!) 0%  Weight:      Height:  Intake/Output Summary (Last 24 hours) at 06/19/2021 1543 Last data filed at 06/19/2021 1300 Gross per 24 hour  Intake 1239.26 ml  Output 725 ml  Net 514.26 ml   Last 3 Weights 06/16/2021 06/16/2021 02/21/2018  Weight (lbs) 164 lb 1.6 oz 167 lb 1.7 oz 167 lb  Weight (kg) 74.435 kg 75.8 kg 75.751 kg     Body mass index is 22.89 kg/m.  General:  Well nourished, well developed, in no acute distress HEENT: normal Lymph: no adenopathy Neck: no JVD Endocrine:  No thryomegaly Vascular: No carotid bruits; FA pulses 2+ bilaterally without bruits  Cardiac:  RRR; bradycardic, no murmurs gallops or rubs Lungs:  CTA b/l, no wheezing, rhonchi or rales  Abd: soft, nontender  Ext: no edema Musculoskeletal:  No deformities Skin: warm and dry  Neuro:  no focal abnormalities noted Psych:  Normal affect   EKG:  The EKG was personally reviewed and demonstrates:    06/18/2021 ECG Sinus with frequent monomorphic PVCs with a left superior axis and positive throughout precordium       Telemetry:  Telemetry was personally reviewed and demonstrates:   SB/SR 50's, PVC burden is improved, infrequently now with transient V bigeminy   Relevant CV Studies:  06/19/21: LHC   Left dominant coronary anatomy   Widely patent left main   Widely patent LAD but with total occlusion of a large first diagonal   Total occlusion of the distal circumflex which supplied the PDA.  There is total occlusion of the large early arising first obtuse marginal.   No selective engagement of the presumed nondominant right coronary was possible.  Faint opacification of a small conus branch could be seen.  This is the same situation noted in 2003 prior to coronary bypass surgery.   Left ventriculography by hand-injection reveals a large inferobasal  aneurysm.  EF less than 50%.  LVEDP is normal.   Atretic LIMA to LAD   60% ostial narrowing of the saphenous vein graft to the first obtuse marginal   Widely patent saphenous vein graft to first diagonal   Recommendations Guideline directed therapy for systolic dysfunction. Further evaluation as needed relative to ventricular arrhythmia per EP.   06/16/2021 Echo personally reviewed  Basal inferior aneurysmal segment EF moderately decreased Prominent trabeculations in the LV and RV    06/19/2021 LHC  Inferobasal aneurysm   Severe multivessel CAD. Patent LAD is wrap around to the inferior wall (apical and likely mid segments as well)     Laboratory Data:  High Sensitivity Troponin:   Recent Labs  Lab 06/16/21 0800 06/16/21 1015 06/16/21 1640 06/17/21 0722 06/17/21 1025  TROPONINIHS 12 47* 292* 136* 113*     Chemistry Recent Labs  Lab 06/17/21 0245 06/18/21 0111 06/19/21 0225  NA 137 137 136  K 4.0 3.9 4.1  CL 105 107 108  CO2 23 21* 22  GLUCOSE 93 99 115*  BUN '18 14 13  '$ CREATININE 1.28* 1.31* 1.41*  CALCIUM 9.3 9.1 9.1  GFRNONAA >60 >60 55*  ANIONGAP '9 9 6    '$ No results for input(s): PROT, ALBUMIN, AST, ALT, ALKPHOS, BILITOT in the last 168 hours. Hematology Recent Labs  Lab 06/16/21 0800 06/18/21 0111 06/19/21 0225  WBC 6.4 7.2 6.3  RBC 4.49 4.58 4.39  HGB 12.7* 13.0 12.4*  HCT 37.9* 38.2* 36.1*  MCV 84.4 83.4 82.2  MCH 28.3 28.4 28.2  MCHC 33.5 34.0 34.3  RDW 14.0 13.7 13.5  PLT 168 138* 155   BNPNo  results for input(s): BNP, PROBNP in the last 168 hours.  DDimer No results for input(s): DDIMER in the last 168 hours.   Radiology/Studies:  DG Chest Port 1 View Result Date: 06/16/2021 CLINICAL DATA:  Pain, midsternal EXAM: PORTABLE CHEST 1 VIEW COMPARISON:  Chest radiograph 05/21/2014 FINDINGS: Median sternotomy wires and mediastinal surgical material is again seen. The superior most wire is fractured, unchanged. The cardiomediastinal  silhouette is normal. There is no focal consolidation or pulmonary edema. There is no pleural effusion or pneumothorax. There is no acute osseous abnormality. IMPRESSION: No radiographic evidence of acute cardiopulmonary process. Electronically Signed   By: Valetta Mole M.D.   On: 06/16/2021 08:20     Assessment and Plan:   PVCs, NSVT Echo, cath, tlemetry have been reviewed by Dr. Quentin Ore Agree with c.MRI and amiodarone Titrate cardiac meds to tolerance  No role for ICD at this juncture  His full thoughts to follow   Risk Assessment/Risk Scores:    For questions or updates, please contact [place] Please consult [email or web address] for contact info under    Signed, Vickie Epley, MD  06/19/2021 3:43 PM

## 2021-06-19 NOTE — Progress Notes (Signed)
TR BAND REMOVAL  LOCATION:    left radial  DEFLATED PER PROTOCOL:    Yes.    TIME BAND OFF / DRESSING APPLIED:    1700   SITE UPON ARRIVAL:    Level 0  SITE AFTER BAND REMOVAL:    Level 0  CIRCULATION SENSATION AND MOVEMENT:    Within Normal Limits   Yes.    COMMENTS:   Instructions given for post radial site care for the next 24 hours with patient and wife verbalizing understanding

## 2021-06-19 NOTE — Interval H&P Note (Signed)
Cath Lab Visit (complete for each Cath Lab visit)  Clinical Evaluation Leading to the Procedure:   ACS: Yes.    Non-ACS:    Anginal Classification: CCS III  Anti-ischemic medical therapy: Minimal Therapy (1 class of medications)  Non-Invasive Test Results: No non-invasive testing performed  Prior CABG: Previous CABG      History and Physical Interval Note:  06/19/2021 12:15 PM  Nicholas [name]  has presented today for surgery, with the diagnosis of NSTEMI.  The various methods of treatment have been discussed with the patient and family. After consideration of risks, benefits and other options for treatment, the patient has consented to  Procedure(s): LEFT HEART CATH AND CORS/GRAFTS ANGIOGRAPHY (N/A) as a surgical intervention.  The patient's history has been reviewed, patient examined, no change in status, stable for surgery.  I have reviewed the patient's chart and labs.  Questions were answered to the patient's satisfaction.     [name]

## 2021-06-20 ENCOUNTER — Other Ambulatory Visit (HOSPITAL_COMMUNITY): Payer: Self-pay

## 2021-06-20 ENCOUNTER — Encounter (HOSPITAL_COMMUNITY): Payer: Self-pay | Admitting: Interventional Cardiology

## 2021-06-20 DIAGNOSIS — I214 Non-ST elevation (NSTEMI) myocardial infarction: Secondary | ICD-10-CM | POA: Diagnosis not present

## 2021-06-20 DIAGNOSIS — I5043 Acute on chronic combined systolic (congestive) and diastolic (congestive) heart failure: Secondary | ICD-10-CM | POA: Diagnosis not present

## 2021-06-20 DIAGNOSIS — I472 Ventricular tachycardia: Secondary | ICD-10-CM | POA: Diagnosis not present

## 2021-06-20 LAB — BASIC METABOLIC PANEL
Anion gap: 7 (ref 5–15)
BUN: 12 mg/dL (ref 8–23)
CO2: 22 mmol/L (ref 22–32)
Calcium: 9 mg/dL (ref 8.9–10.3)
Chloride: 107 mmol/L (ref 98–111)
Creatinine, Ser: 1.33 mg/dL — ABNORMAL HIGH (ref 0.61–1.24)
GFR, Estimated: 59 mL/min — ABNORMAL LOW (ref >60)
Glucose, Bld: 93 mg/dL (ref 70–99)
Potassium: 3.9 mmol/L (ref 3.5–5.1)
Sodium: 136 mmol/L (ref 135–145)

## 2021-06-20 LAB — CBC
HCT: 35.4 % — ABNORMAL LOW (ref 39.0–52.0)
Hemoglobin: 12.2 g/dL — ABNORMAL LOW (ref 13.0–17.0)
MCH: 28.4 pg (ref 26.0–34.0)
MCHC: 34.5 g/dL (ref 30.0–36.0)
MCV: 82.5 fL (ref 80.0–100.0)
Platelets: 146 10*3/uL — ABNORMAL LOW (ref 150–400)
RBC: 4.29 MIL/uL (ref 4.22–5.81)
RDW: 13.8 % (ref 11.5–15.5)
WBC: 7.1 10*3/uL (ref 4.0–10.5)
nRBC: 0 % (ref 0.0–0.2)

## 2021-06-20 LAB — MAGNESIUM: Magnesium: 1.7 mg/dL (ref 1.7–2.4)

## 2021-06-20 MED ORDER — AMIODARONE HCL 200 MG PO TABS
200.0000 mg | ORAL_TABLET | Freq: Two times a day (BID) | ORAL | Status: DC
  Administered 2021-06-20 – 2021-06-21 (×3): 200 mg via ORAL
  Filled 2021-06-20 (×3): qty 1

## 2021-06-20 MED ORDER — AMIODARONE HCL 200 MG PO TABS: 200.0000 mg | ORAL_TABLET | Freq: Every day | ORAL | Status: DC

## 2021-06-20 NOTE — Progress Notes (Signed)
Progress Note  Patient Name: Nicholas [name] Date of Encounter: 06/20/2021  [facility] Cardiologist:   Subjective   Nicholas [name] is a 65 year old gentleman with a history of coronary artery disease, coronary artery bypass grafting, hypertension, hyperlipidemia.  We are were asked to see him for further evaluation of chest discomfort.  Troponin levels peaked at 292 on September 9. Cath yesterday reveals a patent LAD.  The first diagonal artery is occluded.  There is a total occlusion of the distal circumflex artery.  The LIMA to LAD is atretic.  SVG to first OM has a 60% stenosis at the ostium.  The SVG to the first diagonal artery is widely patent.  Medical therapy was recommended. He has been found to have nonsustained ventricular tachycardia.  Electrophysiology saw him yesterday. He has been started on amiodarone.  Dr. Quentin [name] thought that his ventricular tachycardia was likely ischemic in origin.  He recommended cardiac MRI for further characterization of the left ventricular scar.  He recommended continuing amiodarone, changing to oral amiodarone tomorrow  He feels well today . No CP .  Inpatient Medications    Scheduled Meds:  alfuzosin  10 mg Oral q1800   aspirin  81 mg Oral Daily   heparin  5,000 Units Subcutaneous Q8H   melatonin  3 mg Oral QHS   metoprolol tartrate  25 mg Oral BID   rosuvastatin  40 mg Oral q1800   sodium chloride flush  3 mL Intravenous Q12H   Continuous Infusions:  sodium chloride     amiodarone 30 mg/hr (06/19/21 2052)   PRN Meds: sodium chloride, acetaminophen, ondansetron (ZOFRAN) IV, oxyCODONE, sodium chloride flush   Vital Signs    Vitals:   06/19/21 2001 06/20/21 0021 06/20/21 0511 06/20/21 0806  BP: 121/83 106/68 130/61 112/74  Pulse: 60 60 69 61  Resp: '18 17 15 16  '$ Temp: 98.8 F (37.1 C) 98.8 F (37.1 C) 99.1 F (37.3 C) 98.9 F (37.2 C)  TempSrc: Oral Oral Oral Oral  SpO2: 98%   100%  Weight:      Height:         Intake/Output Summary (Last 24 hours) at 06/20/2021 [id number] Last data filed at 06/20/2021 0022 Gross per 24 hour  Intake 93.18 ml  Output 200 ml  Net -106.82 ml   Last 3 Weights 06/16/2021 06/16/2021 02/21/2018  Weight (lbs) 164 lb 1.6 oz 167 lb 1.7 oz 167 lb  Weight (kg) 74.435 kg 75.8 kg 75.751 kg      Telemetry    NSR , occasional episodes of nonsustained VT - Personally Reviewed  ECG     - Personally Reviewed  Physical Exam   GEN: No acute distress.   Neck: No JVD Cardiac: RRR, no murmurs, rubs, or gallops.  Respiratory: Clear to auscultation bilaterally. GI: Soft, nontender, non-distended  MS: No edema; No deformity. Neuro:  Nonfocal  Psych: Normal affect   Labs    High Sensitivity Troponin:   Recent Labs  Lab 06/16/21 0800 06/16/21 1015 06/16/21 1640 06/17/21 0722 06/17/21 1025  TROPONINIHS 12 47* 292* 136* 113*      Chemistry Recent Labs  Lab 06/18/21 0111 06/19/21 0225 06/20/21 0222  NA 137 136 136  K 3.9 4.1 3.9  CL 107 108 107  CO2 21* 22 22  GLUCOSE 99 115* 93  BUN '14 13 12  '$ CREATININE 1.31* 1.41* 1.33*  CALCIUM 9.1 9.1 9.0  GFRNONAA >60 55* 59*  ANIONGAP 9 6 7  Hematology Recent Labs  Lab 06/18/21 0111 06/19/21 0225 06/20/21 0222  WBC 7.2 6.3 7.1  RBC 4.58 4.39 4.29  HGB 13.0 12.4* 12.2*  HCT 38.2* 36.1* 35.4*  MCV 83.4 82.2 82.5  MCH 28.4 28.2 28.4  MCHC 34.0 34.3 34.5  RDW 13.7 13.5 13.8  PLT 138* 155 146*    BNPNo results for input(s): BNP, PROBNP in the last 168 hours.   DDimer No results for input(s): DDIMER in the last 168 hours.   Radiology    CARDIAC CATHETERIZATION  Result Date: 06/19/2021   Left dominant coronary anatomy   Widely patent left main   Widely patent LAD but with total occlusion of a large first diagonal   Total occlusion of the distal circumflex which supplied the PDA.  There is total occlusion of the large early arising first obtuse marginal.   No selective engagement of the presumed nondominant  right coronary was possible.  Faint opacification of a small conus branch could be seen.  This is the same situation noted in 2003 prior to coronary bypass surgery.   Left ventriculography by hand-injection reveals a large inferobasal aneurysm.  EF less than 50%.  LVEDP is normal.   Atretic LIMA to LAD   60% ostial narrowing of the saphenous vein graft to the first obtuse marginal   Widely patent saphenous vein graft to first diagonal Recommendations Guideline directed therapy for systolic dysfunction. Further evaluation as needed relative to ventricular arrhythmia per EP.    Cardiac Studies   LHC 06/19/21:   Left dominant coronary anatomy   Widely patent left main   Widely patent LAD but with total occlusion of a large first diagonal   Total occlusion of the distal circumflex which supplied the PDA.  There is total occlusion of the large early arising first obtuse marginal.   No selective engagement of the presumed nondominant right coronary was possible.  Faint opacification of a small conus branch could be seen.  This is the same situation noted in 2003 prior to coronary bypass surgery.   Left ventriculography by hand-injection reveals a large inferobasal aneurysm.  EF less than 50%.  LVEDP is normal.   Atretic LIMA to LAD   60% ostial narrowing of the saphenous vein graft to the first obtuse marginal   Widely patent saphenous vein graft to first diagonal   Recommendations Guideline directed therapy for systolic dysfunction. Further evaluation as needed relative to ventricular arrhythmia per EP.  Echocardiogram 06/16/21: 1. Frequent PVCs. Left ventricular ejection fraction, by estimation, is  45 to 50%. The left ventricle has mildly decreased function. The left  ventricle demonstrates regional wall motion abnormalities. Akinesis of  basal inferior wall. There is moderate  left ventricular hypertrophy. Left ventricular diastolic parameters are  indeterminate.   2. Right ventricular systolic  function is mildly reduced. The right  ventricular size is normal. Tricuspid regurgitation signal is inadequate  for assessing PA pressure.   3. The mitral valve is normal in structure. Mild mitral valve  regurgitation. No evidence of mitral stenosis.   4. The aortic valve is tricuspid. Aortic valve regurgitation is trivial.  No aortic stenosis is present.   5. The inferior vena cava is normal in size with greater than 50%  respiratory variability, suggesting right atrial pressure of 3 mmHg.    Patient Profile     65 y.o. male with a PMH of CAD s/p CABG in 2003, HTN, HLD, and presyncope, who presented with chest pain. Cardiology following for NSTEMI.  Assessment & Plan    1. NSTEMI: patient presented following a 1 hour episode of chest pain. HsTrop 12> 47> 292>136> 113. EKG showed sinus rhythm with bigeminy and nonspecific T wave abnormalities.  Echocardiogram showed EF 45-50%, akinesis of basal inferior wall, moderate LVH, indeterminate LV diastolic function, mildly reduced RV systolic function, and mild MR.  He underwent LHR which showed severe native RCA, LCx, OM, and D1 stenosis with atretic LIMA, 60% SVG to OM1 stenosis, and patent SVG to D1.  No intervention performed.  He was noted to have episodes of NSVT with presentation concerning for possible ventricular arrhythmia.  Cardiac MRI ordered. - Continue aspirin and statin - Continue beta-blocker  2. NSVT: Patient also had dizziness on presentation and noted to have frequent PVCs.  Possible ventricular arrhythmia contributed to #1.  He was started on IV amiodarone and metoprolol uptitrated to  25 mg twice daily.  EP evaluated patient and agreed with cardiac MR to further characterize LV scar burden and LV function. - Will transition to p.o. amiodarone today-200 mg twice daily x1 week followed by 200 mg daily. - Continue metoprolol for now - did have some mild bradycardia yesterday afternoon - Await cardiac MR results  3. Acute  combine CHF: Echo with mild biventricular CHF this admission.  LVEDP normal on [facility] yesterday.  He was started on beta-blocker this admission. - Continue metoprolol - Await cardiac MR to further characterize LV function  4. HTN: BP generally stable. Home HCTZ on hold - Continue metoprolol for now  5. HLD: no recent lipids on file. He was transitioned from simvastatin to rosuvastatin - Continue rosuvastatin - Will need lipid check and LFTs in 6-8 weeks       For questions or updates, please contact [place] Please consult [email or web address] for contact info under        Signed, Abigail Butts, PA-C  06/20/2021, 8:22 AM     Attending Note:   The patient was seen and examined.  Agree with assessment and plan as noted above.  Changes made to the above note as needed.  Patient seen and independently examined with Roby Lofts, PA .   We discussed all aspects of the encounter. I agree with the assessment and plan as stated above.    CAD : Had bypass grafting many years ago.  His troponin levels are relatively low but are consistent with a non-ST elevation segment myocardial infarction.  Cath showed only moderate disease.  He has an atretic LAD.  Medical therapy was recommended.  He is getting a cardiac MRI to evaluate his inferior scar. Continue current medications. 2.  Nonsustained ventricular tachycardia.  Continue amiodarone.  He is scheduled to get a cardiac MRI today.  2.  Acute combined congestive heart failure: LVEDP was normal at time of heart catheterization yesterday.  We will get a better look at his LV function today with a cardiac MRI.   4.  Hypertension: Generally stable.    I have spent a total of 40 minutes with patient reviewing hospital  notes , telemetry, EKGs, labs and examining patient as well as establishing an assessment and plan that was discussed with the patient.  > 50% of time was spent in direct patient care.    Thayer Headings, Brooke Bonito., MD,  [facility] 06/20/2021, 11:03 AM [address],  [place] Pager [phone]

## 2021-06-20 NOTE — Progress Notes (Signed)
Pt had a 9-beat-run of V-tach nonsustained. Pt is resting comfortably & asymptomatic. [place], MD made aware. Will continue to monitor.   Elaina Hoops, RN

## 2021-06-20 NOTE — Progress Notes (Signed)
CARDIAC REHAB PHASE I   PRE:  Rate/Rhythm: 15 SB with occ PVC    BP: sitting 135/81    SaO2: 96 RA  MODE:  Ambulation: 300 ft   POST:  Rate/Rhythm: 81 SR with occ PVC, after walk trigeminy    BP: sitting 123/94     SaO2: 99 RA  Pt weak upon standing, needed min assist for unsteady gait in hall. Right leg weaker than left. Denied CP or dizziness walking. Pt had trigeminy after sitting but less PVCs walking. Gave MI book and heart healthy diet. Will f/u after MRI for more ed. [id number]   [place], ACSM 06/20/2021 11:34 AM

## 2021-06-20 NOTE — Progress Notes (Signed)
[name] PROGRESS NOTE    [name]  [id number] DOB: 10-May-1956 DOA: 06/16/2021 PCP: Alroy Dust, L.Marlou Sa, MD      Brief Narrative:  Mr. Dryer is a 65 y.o. M with CAD s/p CABG 2003 and HTN who presented with dizziness and chest discomfort.        Assessment & Plan:  NSTEMI Patient admitted and started on heparin.  Tni peaked at 292.  Relatively asymptomatic.   LHC on 9/12 showed patent LAD, some diffuse other disease, medical therapy recommended.  EF obtained this admission, 45-50%  Transitioned off heparin.  Pain free, walked today, felt good. - Continue aspirin, Crestor, metoprolol    Non-sustained ventricular tachycardia Noted on admission to have dizziness with frequent ectopy.  After admission, noted on telemetry to have episodes of NSVT.    EP were consulted, noted his EKG shows a predominantly monomorphic PVC with a left superior axis, whose morphology suggests it originates in that basal inferior aneurysm  Electrophysiology recommend cardiac MRI, transition to oral amiodarone, monitoring of CMP, thyroid studies, and no ICD unless indicated by cardiac MRI  - Continue new amiodarone -Obtain cardiac MRI   Hypertension BP controlled  -Continue metoprolol  BPH - Continue alfuzosin  CKD stage IIIa Cr stable relative to baseline, 0000000  Chronic systolic CHF Acute CHF ruled out. Diastolic CHF ruled out.   Appears euvolemic.  EF this admission is 45-50%.  -Continue new metoprolol         Disposition: Status is: Inpatient  Remains inpatient appropriate because:Ongoing diagnostic testing needed not appropriate for outpatient work up  Dispo: The patient is from: Home              Anticipated d/c is to: Home              Patient currently is not medically stable to d/c.   Difficult to place patient No       Level of care: Telemetry Medical       MDM: The below labs and imaging reports were reviewed and  summarized above.  Medication management as above.    DVT prophylaxis: heparin injection 5,000 Units Start: 06/19/21 2200 SCD's Start: 06/19/21 1612  Code Status: FULL Family Communication: wife at bedside    Consultants:  Cardiology Electrophysiology  Procedures:  9/9 echo -- reduced EF 9/12 LHC -- diffuse disease 9/13 cMRI -- pending  Antimicrobials:     Culture data:             Subjective: Ambulated with physical therapy, felt well.  No chest pain.  Little dizziness.  No confusion, focal weakness, numbness.  Breathing is okay.  No swelling.  Objective: Vitals:   06/20/21 0021 06/20/21 0511 06/20/21 0806 06/20/21 1226  BP: 106/68 130/61 112/74 130/82  Pulse: 60 69 61 (!) 46  Resp: '17 15 16 18  '$ Temp: 98.8 F (37.1 C) 99.1 F (37.3 C) 98.9 F (37.2 C) 98.9 F (37.2 C)  TempSrc: Oral Oral Oral Oral  SpO2:   100% 100%  Weight:      Height:        Intake/Output Summary (Last 24 hours) at 06/20/2021 1553 Last data filed at 06/20/2021 1227 Gross per 24 hour  Intake --  Output 800 ml  Net -800 ml   Filed Weights   06/16/21 0748 06/16/21 1609  Weight: 75.8 kg 74.4 kg    Examination: General appearance:  adult male, alert and in no acute distress.  On the edge of  the bed, eating lunch. HEENT: Anicteric, conjunctiva pink, lids and lashes normal. No nasal deformity, discharge, epistaxis.  Lips moist.   Skin: Warm and dry.  no jaundice.  No suspicious rashes or lesions. Cardiac: RRR, nl S1-S2, no murmurs appreciated.  Capillary refill is brisk.  No JVD, no LE edema.  Radial  pulses 2+ and symmetric. Respiratory: Normal respiratory rate and rhythm.  CTAB without rales or wheezes. Abdomen:     MSK: No deformities or effusions. Neuro: Awake and alert.  EOMI, moves all extremities. Speech fluent.    Psych: Sensorium intact and responding to questions, attention normal. Affect appropriate.  Judgment and insight appear normal.    Data Reviewed: I have  personally reviewed following labs and imaging studies:  CBC: Recent Labs  Lab 06/16/21 0800 06/18/21 0111 06/19/21 0225 06/20/21 0222  WBC 6.4 7.2 6.3 7.1  NEUTROABS 4.5  --   --   --   HGB 12.7* 13.0 12.4* 12.2*  HCT 37.9* 38.2* 36.1* 35.4*  MCV 84.4 83.4 82.2 82.5  PLT 168 138* 155 [id number]*   Basic Metabolic Panel: Recent Labs  Lab 06/16/21 0800 06/16/21 1640 06/17/21 0245 06/18/21 0111 06/18/21 0948 06/19/21 0225 06/20/21 0222  NA 136  --  137 137  --  136 136  K 4.3  --  4.0 3.9  --  4.1 3.9  CL 106  --  105 107  --  108 107  CO2 22  --  23 21*  --  22 22  GLUCOSE 104*  --  93 99  --  115* 93  BUN 17  --  18 14  --  13 12  CREATININE 1.32*  --  1.28* 1.31*  --  1.41* 1.33*  CALCIUM 8.8*  --  9.3 9.1  --  9.1 9.0  MG  --   --   --   --  1.9 2.0 1.7  PHOS  --  2.8  --   --   --   --   --    GFR: Estimated Creatinine Clearance: 58.3 mL/min (A) (by C-G formula based on SCr of 1.33 mg/dL (H)). Liver Function Tests: No results for input(s): AST, ALT, ALKPHOS, BILITOT, PROT, ALBUMIN in the last 168 hours. No results for input(s): LIPASE, AMYLASE in the last 168 hours. No results for input(s): AMMONIA in the last 168 hours. Coagulation Profile: No results for input(s): INR, PROTIME in the last 168 hours. Cardiac Enzymes: No results for input(s): CKTOTAL, CKMB, CKMBINDEX, TROPONINI in the last 168 hours. BNP (last 3 results) No results for input(s): PROBNP in the last 8760 hours. HbA1C: No results for input(s): HGBA1C in the last 72 hours. CBG: No results for input(s): GLUCAP in the last 168 hours. Lipid Profile: No results for input(s): CHOL, HDL, LDLCALC, TRIG, CHOLHDL, LDLDIRECT in the last 72 hours. Thyroid Function Tests: No results for input(s): TSH, T4TOTAL, FREET4, T3FREE, THYROIDAB in the last 72 hours. Anemia Panel: No results for input(s): VITAMINB12, FOLATE, FERRITIN, TIBC, IRON, RETICCTPCT in the last 72 hours. Urine analysis: No results found for:  COLORURINE, APPEARANCEUR, LABSPEC, PHURINE, GLUCOSEU, HGBUR, BILIRUBINUR, KETONESUR, PROTEINUR, UROBILINOGEN, NITRITE, LEUKOCYTESUR Sepsis Labs: '@LABRCNTIP'$ (procalcitonin:4,lacticacidven:4)  ) Recent Results (from the past 240 hour(s))  Resp Panel by RT-PCR (Flu A&B, Covid) Nasopharyngeal Swab     Status: None   Collection Time: 06/16/21  8:02 PM   Specimen: Nasopharyngeal Swab; Nasopharyngeal(NP) swabs in vial transport medium  Result Value Ref Range Status   SARS Coronavirus 2 by  RT PCR NEGATIVE NEGATIVE Final    Comment: (NOTE) SARS-CoV-2 target nucleic acids are NOT DETECTED.  The SARS-CoV-2 RNA is generally detectable in upper respiratory specimens during the acute phase of infection. The lowest concentration of SARS-CoV-2 viral copies this assay can detect is 138 copies/mL. A negative result does not preclude SARS-Cov-2 infection and should not be used as the sole basis for treatment or other patient management decisions. A negative result may occur with  improper specimen collection/handling, submission of specimen other than nasopharyngeal swab, presence of viral mutation(s) within the areas targeted by this assay, and inadequate number of viral copies(<138 copies/mL). A negative result must be combined with clinical observations, patient history, and epidemiological information. The expected result is Negative.  Fact Sheet for Patients:  EntrepreneurPulse.com.au  Fact Sheet for Healthcare Providers:  IncredibleEmployment.be  This test is no t yet approved or cleared by the [place] FDA and  has been authorized for detection and/or diagnosis of SARS-CoV-2 by FDA under an Emergency Use Authorization (EUA). This EUA will remain  in effect (meaning this test can be used) for the duration of the COVID-19 declaration under Section 564(b)(1) of the Act, 21 U.S.C.section 360bbb-3(b)(1), unless the authorization is terminated  or revoked  sooner.       Influenza A by PCR NEGATIVE NEGATIVE Final   Influenza B by PCR NEGATIVE NEGATIVE Final    Comment: (NOTE) The Xpert Xpress SARS-CoV-2/FLU/RSV plus assay is intended as an aid in the diagnosis of influenza from Nasopharyngeal swab specimens and should not be used as a sole basis for treatment. Nasal washings and aspirates are unacceptable for Xpert Xpress SARS-CoV-2/FLU/RSV testing.  Fact Sheet for Patients: EntrepreneurPulse.com.au  Fact Sheet for Healthcare Providers: IncredibleEmployment.be  This test is not yet approved or cleared by the [place] FDA and has been authorized for detection and/or diagnosis of SARS-CoV-2 by FDA under an Emergency Use Authorization (EUA). This EUA will remain in effect (meaning this test can be used) for the duration of the COVID-19 declaration under Section 564(b)(1) of the Act, 21 U.S.C. section 360bbb-3(b)(1), unless the authorization is terminated or revoked.  Performed at [facility], [place] [address], [place], [place] [address]          Radiology Studies: CARDIAC CATHETERIZATION  Result Date: 06/19/2021   Left dominant coronary anatomy   Widely patent left main   Widely patent LAD but with total occlusion of a large first diagonal   Total occlusion of the distal circumflex which supplied the PDA.  There is total occlusion of the large early arising first obtuse marginal.   No selective engagement of the presumed nondominant right coronary was possible.  Faint opacification of a small conus branch could be seen.  This is the same situation noted in 2003 prior to coronary bypass surgery.   Left ventriculography by hand-injection reveals a large inferobasal aneurysm.  EF less than 50%.  LVEDP is normal.   Atretic LIMA to LAD   60% ostial narrowing of the saphenous vein graft to the first obtuse marginal   Widely patent saphenous vein graft to first diagonal Recommendations Guideline  directed therapy for systolic dysfunction. Further evaluation as needed relative to ventricular arrhythmia per EP.        Scheduled Meds:  alfuzosin  10 mg Oral q1800   amiodarone  200 mg Oral BID   Followed by   Derrill Memo ON 06/27/2021] amiodarone  200 mg Oral Daily   aspirin  81 mg Oral Daily  heparin  5,000 Units Subcutaneous Q8H   melatonin  3 mg Oral QHS   metoprolol tartrate  25 mg Oral BID   rosuvastatin  40 mg Oral q1800   sodium chloride flush  3 mL Intravenous Q12H   Continuous Infusions:  sodium chloride       LOS: 4 days    Time spent: 25 minutes    Edwin Dada, MD [name] 06/20/2021, 3:53 PM     Please page though [place] or Epic secure chat:  For [employer], Adult nurse

## 2021-06-20 NOTE — TOC Benefit Eligibility Note (Signed)
Patient Teacher, English as a foreign language completed.    The patient is currently admitted and upon discharge could be taking Jardiance 10 mg.  The current 30 day co-pay is, $42.99.  [place] 10 MG TABLET paid 40.01 toward plan copay   The patient is currently admitted and upon discharge could be taking Farxiga 10 mg.  The current 30 day co-pay is, $15.00.  Prest[place] 10 MG TABLET paid 87.31 toward plan copay  The patient is insured through [place], [place] Patient Advocate Specialist [place] Team Direct Number: [phone]  Fax: [phone]

## 2021-06-20 NOTE — Care Management Important Message (Signed)
Important Message  Patient Details  Name: Nicholas [name] MRN: [id number] Date of Birth: Mar 30, 1956   Medicare Important Message Given:  Yes     Orbie Pyo 06/20/2021, 3:39 PM

## 2021-06-21 ENCOUNTER — Other Ambulatory Visit (HOSPITAL_COMMUNITY): Payer: Self-pay

## 2021-06-21 ENCOUNTER — Inpatient Hospital Stay (HOSPITAL_COMMUNITY): Payer: 59

## 2021-06-21 DIAGNOSIS — I2583 Coronary atherosclerosis due to lipid rich plaque: Secondary | ICD-10-CM

## 2021-06-21 DIAGNOSIS — I472 Ventricular tachycardia: Secondary | ICD-10-CM | POA: Diagnosis not present

## 2021-06-21 DIAGNOSIS — I214 Non-ST elevation (NSTEMI) myocardial infarction: Secondary | ICD-10-CM

## 2021-06-21 MED ORDER — AMIODARONE HCL 200 MG PO TABS
200.0000 mg | ORAL_TABLET | Freq: Every day | ORAL | 3 refills | Status: DC
  Filled 2021-06-21 – 2021-06-26 (×2): qty 30, 30d supply, fill #0
  Filled 2021-07-24: qty 30, 30d supply, fill #1
  Filled 2021-08-21: qty 30, 30d supply, fill #2

## 2021-06-21 MED ORDER — METOPROLOL TARTRATE 25 MG PO TABS
25.0000 mg | ORAL_TABLET | Freq: Two times a day (BID) | ORAL | 3 refills | Status: DC
  Filled 2021-06-21: qty 60, 30d supply, fill #0

## 2021-06-21 MED ORDER — AMIODARONE HCL 200 MG PO TABS
200.0000 mg | ORAL_TABLET | Freq: Two times a day (BID) | ORAL | 0 refills | Status: DC
  Filled 2021-06-21: qty 10, 5d supply, fill #0

## 2021-06-21 MED ORDER — GADOBUTROL 1 MMOL/ML IV SOLN
7.0000 mL | Freq: Once | INTRAVENOUS | Status: AC | PRN
  Administered 2021-06-21: 7 mL via INTRAVENOUS

## 2021-06-21 MED ORDER — ROSUVASTATIN CALCIUM 40 MG PO TABS
40.0000 mg | ORAL_TABLET | Freq: Every day | ORAL | 3 refills | Status: DC
Start: 2021-06-21 — End: 2021-10-23
  Filled 2021-06-21: qty 30, 30d supply, fill #0
  Filled 2021-07-24: qty 30, 30d supply, fill #1
  Filled 2021-08-21: qty 30, 30d supply, fill #2
  Filled 2021-09-21: qty 30, 30d supply, fill #3

## 2021-06-21 NOTE — Progress Notes (Addendum)
Progress Note  Patient Name: Nicholas [name] Date of Encounter: 06/21/2021  [facility] Cardiologist: Candee Furbish, MD   Subjective   Feeling okay this morning. No complaints of CP or SOB. Occasional palpitations but nothing persistent.   Inpatient Medications    Scheduled Meds:  alfuzosin  10 mg Oral q1800   amiodarone  200 mg Oral BID   Followed by   Derrill Memo ON 06/27/2021] amiodarone  200 mg Oral Daily   aspirin  81 mg Oral Daily   heparin  5,000 Units Subcutaneous Q8H   melatonin  3 mg Oral QHS   metoprolol tartrate  25 mg Oral BID   rosuvastatin  40 mg Oral q1800   sodium chloride flush  3 mL Intravenous Q12H   Continuous Infusions:  sodium chloride     PRN Meds: sodium chloride, acetaminophen, ondansetron (ZOFRAN) IV, oxyCODONE, sodium chloride flush   Vital Signs    Vitals:   06/20/21 1607 06/20/21 2112 06/20/21 2334 06/21/21 0500  BP: 134/78 128/80 121/79 125/76  Pulse: (!) 51 (!) 59 65 75  Resp: '16 16 18 18  '$ Temp: 99.7 F (37.6 C) 98.3 F (36.8 C) 98.2 F (36.8 C) 98.2 F (36.8 C)  TempSrc: Oral Oral Oral Oral  SpO2: 98% 100% 99% 99%  Weight:      Height:        Intake/Output Summary (Last 24 hours) at 06/21/2021 1003 Last data filed at 06/20/2021 2126 Gross per 24 hour  Intake --  Output 900 ml  Net -900 ml   Last 3 Weights 06/16/2021 06/16/2021 02/21/2018  Weight (lbs) 164 lb 1.6 oz 167 lb 1.7 oz 167 lb  Weight (kg) 74.435 kg 75.8 kg 75.751 kg      Telemetry    Sinus rhythm with occasional PVCs or couplets  - Personally Reviewed  ECG    No new tracings - Personally Reviewed  Physical Exam   GEN: No acute distress.   Neck: No JVD Cardiac: RRR, no murmurs, rubs, or gallops.  Respiratory: Clear to auscultation bilaterally. GI: Soft, nontender, non-distended  MS: No edema; No deformity. Neuro:  Nonfocal  Psych: Normal affect   Labs    High Sensitivity Troponin:   Recent Labs  Lab 06/16/21 0800 06/16/21 1015 06/16/21 1640  06/17/21 0722 06/17/21 1025  TROPONINIHS 12 47* 292* 136* 113*      Chemistry Recent Labs  Lab 06/18/21 0111 06/19/21 0225 06/20/21 0222  NA 137 136 136  K 3.9 4.1 3.9  CL 107 108 107  CO2 21* 22 22  GLUCOSE 99 115* 93  BUN '14 13 12  '$ CREATININE 1.31* 1.41* 1.33*  CALCIUM 9.1 9.1 9.0  GFRNONAA >60 55* 59*  ANIONGAP '9 6 7     '$ Hematology Recent Labs  Lab 06/18/21 0111 06/19/21 0225 06/20/21 0222  WBC 7.2 6.3 7.1  RBC 4.58 4.39 4.29  HGB 13.0 12.4* 12.2*  HCT 38.2* 36.1* 35.4*  MCV 83.4 82.2 82.5  MCH 28.4 28.2 28.4  MCHC 34.0 34.3 34.5  RDW 13.7 13.5 13.8  PLT 138* 155 146*    BNPNo results for input(s): BNP, PROBNP in the last 168 hours.   DDimer No results for input(s): DDIMER in the last 168 hours.   Radiology    CARDIAC CATHETERIZATION  Result Date: 06/19/2021   Left dominant coronary anatomy   Widely patent left main   Widely patent LAD but with total occlusion of a large first diagonal   Total occlusion of the  distal circumflex which supplied the PDA.  There is total occlusion of the large early arising first obtuse marginal.   No selective engagement of the presumed nondominant right coronary was possible.  Faint opacification of a small conus branch could be seen.  This is the same situation noted in 2003 prior to coronary bypass surgery.   Left ventriculography by hand-injection reveals a large inferobasal aneurysm.  EF less than 50%.  LVEDP is normal.   Atretic LIMA to LAD   60% ostial narrowing of the saphenous vein graft to the first obtuse marginal   Widely patent saphenous vein graft to first diagonal Recommendations Guideline directed therapy for systolic dysfunction. Further evaluation as needed relative to ventricular arrhythmia per EP.    Cardiac Studies   LHC 06/19/21:   Left dominant coronary anatomy   Widely patent left main   Widely patent LAD but with total occlusion of a large first diagonal   Total occlusion of the distal circumflex which  supplied the PDA.  There is total occlusion of the large early arising first obtuse marginal.   No selective engagement of the presumed nondominant right coronary was possible.  Faint opacification of a small conus branch could be seen.  This is the same situation noted in 2003 prior to coronary bypass surgery.   Left ventriculography by hand-injection reveals a large inferobasal aneurysm.  EF less than 50%.  LVEDP is normal.   Atretic LIMA to LAD   60% ostial narrowing of the saphenous vein graft to the first obtuse marginal   Widely patent saphenous vein graft to first diagonal   Recommendations Guideline directed therapy for systolic dysfunction. Further evaluation as needed relative to ventricular arrhythmia per EP.   Echocardiogram 06/16/21: 1. Frequent PVCs. Left ventricular ejection fraction, by estimation, is  45 to 50%. The left ventricle has mildly decreased function. The left  ventricle demonstrates regional wall motion abnormalities. Akinesis of  basal inferior wall. There is moderate  left ventricular hypertrophy. Left ventricular diastolic parameters are  indeterminate.   2. Right ventricular systolic function is mildly reduced. The right  ventricular size is normal. Tricuspid regurgitation signal is inadequate  for assessing PA pressure.   3. The mitral valve is normal in structure. Mild mitral valve  regurgitation. No evidence of mitral stenosis.   4. The aortic valve is tricuspid. Aortic valve regurgitation is trivial.  No aortic stenosis is present.   5. The inferior vena cava is normal in size with greater than 50%  respiratory variability, suggesting right atrial pressure of 3 mmHg.   Patient Profile     65 y.o. male with a PMH of CAD s/p CABG in 2003, HTN, HLD, and presyncope, who presented with chest pain. Cardiology following for NSTEMI.    Assessment & Plan    1. NSTEMI in patient with CAD s/p CABG in 2003: patient presented following a 1 hour episode of chest  pain. HsTrop 12> 47> 292>136> 113. EKG showed sinus rhythm with bigeminy and nonspecific T wave abnormalities.  Echocardiogram showed EF 45-50%, akinesis of basal inferior wall, moderate LVH, indeterminate LV diastolic function, mildly reduced RV systolic function, and mild MR.  He underwent LHR which showed severe native RCA, LCx, OM, and D1 stenosis with atretic LIMA, 60% SVG to OM1 stenosis, and patent SVG to D1.  No intervention performed.  He was noted to have episodes of NSVT with presentation concerning for possible ventricular arrhythmia.  Cardiac MRI completed - await formal read. - Continue aspirin  and statin - Continue beta-blocker  2. NSVT: Patient also had dizziness on presentation and noted to have frequent PVCs.  Possible ventricular arrhythmia contributed to #1.  He was started on IV amiodarone and metoprolol uptitrated to  25 mg twice daily.  EP evaluated patient and agreed with cardiac MR to further characterize LV scar burden and LV function. - Will transition to p.o. amiodarone today-200 mg twice daily x1 week followed by 200 mg daily. - Continue metoprolol for now - did have some mild bradycardia yesterday afternoon - Await cardiac MR results - will review with EP to finalize recommendations   3. Acute combine CHF: Echo with mild biventricular CHF this admission.  LVEDP normal on [facility] yesterday.  He was started on beta-blocker this admission. - Continue metoprolol - Await cardiac MR to further characterize LV function   4. HTN: BP generally stable. Home HCTZ on hold - Continue metoprolol for now   5. HLD: no recent lipids on file. He was transitioned from simvastatin to rosuvastatin - Continue rosuvastatin - Will need lipid check and LFTs in 6-8 weeks     For questions or updates, please contact [place] Please consult [email or web address] for contact info under        Signed, Abigail Butts, PA-C  06/21/2021, 10:03 AM    Personally seen and examined. Agree with  above.  Non-ST elevation myocardial infarction - Most likely demand ischemia process given low level troponin increase of 292 at peak.  Cardiac catheterization showed severe native disease but no intervention was performed. -Continue with beta-blocker aspirin statin   Nonsustained ventricular tachycardia - Has had some dizziness frequent PVCs and possible ventricular arrhythmia.  He had IV amiodarone and increased metoprolol.  Appreciate EP evaluation.  Amiodarone p.o. currently being utilized. -Underwent cardiac MRI.  EP aware.  Candee Furbish, MD

## 2021-06-21 NOTE — Progress Notes (Signed)
CARDIAC REHAB PHASE I   PRE:  Rate/Rhythm: 64 SR    BP: sitting 117/81    SaO2:   MODE:  Ambulation: 420 ft   POST:  Rate/Rhythm: 90 SR, 84 bigeminy after walk    BP: sitting 141/75     SaO2:   No sx walking, no c/o. Bigeminy once sitting. Discussed HF management, exercise, diet, NTG and CRPII. Will refer to [place]. Pt and wife receptive. [place], ACSM 06/21/2021 11:32 AM

## 2021-06-21 NOTE — Progress Notes (Signed)
[facility] PROGRESS NOTE    Nicholas [name]  [id number] DOB: [birth year] DOA: 06/16/2021 PCP: Alroy Dust, L.Marlou Sa, MD      Brief Narrative:  Nicholas [name] is a 65 y.o. M with CAD s/p CABG 2003 and HTN who presented with dizziness and chest discomfort.        Assessment & Plan:  NSTEMI See summary yesterday Asymptomatic today - Continue aspirin, Crestor, metoprolol     Non-sustained ventricular tachycardia Some activity noted overnight, he could feel palpitations but no dizziness - Continue amiodarone - Cardiac MRI per cardiology -Consult electrophysiology   Hypertension Blood pressure normal - Continue metoprolol  BPH - Continue alfuzosin  CKD stage IIIa Cr stable relative to baseline, 0000000  Chronic systolic CHF Acute CHF ruled out. Diastolic CHF ruled out.    EF this admission is 45-50%.  Is euvolemic and asymptomatic -Continue metoprolol   COVID-19 vaccination Patient inquired about new bivalent Booster.  Discussed with pharmacy yesterday, this is not yet generally available in the hospital although he is eligible for the by Valent vaccine and I recommended.  Recommended to watch the news, check back with [name], or [facility] upon availability if needed regular posterior.     Disposition: Status is: Inpatient  Remains inpatient appropriate because:Ongoing diagnostic testing needed not appropriate for outpatient work up  Dispo: The patient is from: Home              Anticipated d/c is to: Home              Patient currently is not medically stable to d/c.   Difficult to place patient No       Level of care: Telemetry Medical       MDM: The below labs and imaging reports were reviewed and summarized above.  Medication management as above.    DVT prophylaxis: heparin injection 5,000 Units Start: 06/19/21 2200 SCD's Start: 06/19/21 1612  Code Status: FULL Family Communication: wife    Consultants:   Cardiology Electrophysiology  Procedures:  9/9 echo -- reduced EF 9/12 LHC -- diffuse disease 9/14 cMRI -- pending  Antimicrobials:     Culture data:             Subjective: Feeling well.  No headache, chest pain, dyspnea, swelling.  He had some palpitations overnight which he described as "pounding", lasting minutes only, is not associated with chest pain or dizziness or loss of consciousness.     Objective: Vitals:   06/20/21 1607 06/20/21 2112 06/20/21 2334 06/21/21 0500  BP: 134/78 128/80 121/79 125/76  Pulse: (!) 51 (!) 59 65 75  Resp: '16 16 18 18  '$ Temp: 99.7 F (37.6 C) 98.3 F (36.8 C) 98.2 F (36.8 C) 98.2 F (36.8 C)  TempSrc: Oral Oral Oral Oral  SpO2: 98% 100% 99% 99%  Weight:      Height:        Intake/Output Summary (Last 24 hours) at 06/21/2021 0920 Last data filed at 06/20/2021 2126 Gross per 24 hour  Intake --  Output 900 ml  Net -900 ml   Filed Weights   06/16/21 0748 06/16/21 1609  Weight: 75.8 kg 74.4 kg    Examination: General appearance: Adult male, lying in bed, no acute distress     HEENT:    Skin:  Cardiac: RRR, no murmurs, no lower extremity edema, peripheral pulses normal Respiratory: Normal rate and rhythm  Abdomen:   MSK:  Neuro: Awake and alert, extraocular movements intact,  face symmetric, speech fluent, moves upper extremities with symmetric strength Psych: Attention normal, affect appropriate, judgment and insight normal     Data Reviewed: I have personally reviewed following labs and imaging studies:  CBC: Recent Labs  Lab 06/16/21 0800 06/18/21 0111 06/19/21 0225 06/20/21 0222  WBC 6.4 7.2 6.3 7.1  NEUTROABS 4.5  --   --   --   HGB 12.7* 13.0 12.4* 12.2*  HCT 37.9* 38.2* 36.1* 35.4*  MCV 84.4 83.4 82.2 82.5  PLT 168 138* 155 [id number]*   Basic Metabolic Panel: Recent Labs  Lab 06/16/21 0800 06/16/21 1640 06/17/21 0245 06/18/21 0111 06/18/21 0948 06/19/21 0225 06/20/21 0222  NA 136  --  137 137  --   136 136  K 4.3  --  4.0 3.9  --  4.1 3.9  CL 106  --  105 107  --  108 107  CO2 22  --  23 21*  --  22 22  GLUCOSE 104*  --  93 99  --  115* 93  BUN 17  --  18 14  --  13 12  CREATININE 1.32*  --  1.28* 1.31*  --  1.41* 1.33*  CALCIUM 8.8*  --  9.3 9.1  --  9.1 9.0  MG  --   --   --   --  1.9 2.0 1.7  PHOS  --  2.8  --   --   --   --   --    GFR: Estimated Creatinine Clearance: 58.3 mL/min (A) (by C-G formula based on SCr of 1.33 mg/dL (H)). Liver Function Tests: No results for input(s): AST, ALT, ALKPHOS, BILITOT, PROT, ALBUMIN in the last 168 hours. No results for input(s): LIPASE, AMYLASE in the last 168 hours. No results for input(s): AMMONIA in the last 168 hours. Coagulation Profile: No results for input(s): INR, PROTIME in the last 168 hours. Cardiac Enzymes: No results for input(s): CKTOTAL, CKMB, CKMBINDEX, TROPONINI in the last 168 hours. BNP (last 3 results) No results for input(s): PROBNP in the last 8760 hours. HbA1C: No results for input(s): HGBA1C in the last 72 hours. CBG: No results for input(s): GLUCAP in the last 168 hours. Lipid Profile: No results for input(s): CHOL, HDL, LDLCALC, TRIG, CHOLHDL, LDLDIRECT in the last 72 hours. Thyroid Function Tests: No results for input(s): TSH, T4TOTAL, FREET4, T3FREE, THYROIDAB in the last 72 hours. Anemia Panel: No results for input(s): VITAMINB12, FOLATE, FERRITIN, TIBC, IRON, RETICCTPCT in the last 72 hours. Urine analysis: No results found for: COLORURINE, APPEARANCEUR, LABSPEC, PHURINE, GLUCOSEU, HGBUR, BILIRUBINUR, KETONESUR, PROTEINUR, UROBILINOGEN, NITRITE, LEUKOCYTESUR Sepsis Labs: '@LABRCNTIP'$ (procalcitonin:4,lacticacidven:4)  ) Recent Results (from the past 240 hour(s))  Resp Panel by RT-PCR (Flu A&B, Covid) Nasopharyngeal Swab     Status: None   Collection Time: 06/16/21  8:02 PM   Specimen: Nasopharyngeal Swab; Nasopharyngeal(NP) swabs in vial transport medium  Result Value Ref Range Status   SARS  Coronavirus 2 by RT PCR NEGATIVE NEGATIVE Final    Comment: (NOTE) SARS-CoV-2 target nucleic acids are NOT DETECTED.  The SARS-CoV-2 RNA is generally detectable in upper respiratory specimens during the acute phase of infection. The lowest concentration of SARS-CoV-2 viral copies this assay can detect is 138 copies/mL. A negative result does not preclude SARS-Cov-2 infection and should not be used as the sole basis for treatment or other patient management decisions. A negative result may occur with  improper specimen collection/handling, submission of specimen other than nasopharyngeal swab, presence of viral mutation(s)  within the areas targeted by this assay, and inadequate number of viral copies(<138 copies/mL). A negative result must be combined with clinical observations, patient history, and epidemiological information. The expected result is Negative.  Fact Sheet for Patients:  EntrepreneurPulse.com.au  Fact Sheet for Healthcare Providers:  IncredibleEmployment.be  This test is no t yet approved or cleared by the [place] FDA and  has been authorized for detection and/or diagnosis of SARS-CoV-2 by FDA under an Emergency Use Authorization (EUA). This EUA will remain  in effect (meaning this test can be used) for the duration of the COVID-19 declaration under Section 564(b)(1) of the Act, 21 U.S.C.section 360bbb-3(b)(1), unless the authorization is terminated  or revoked sooner.       Influenza A by PCR NEGATIVE NEGATIVE Final   Influenza B by PCR NEGATIVE NEGATIVE Final    Comment: (NOTE) The Xpert Xpress SARS-CoV-2/FLU/RSV plus assay is intended as an aid in the diagnosis of influenza from Nasopharyngeal swab specimens and should not be used as a sole basis for treatment. Nasal washings and aspirates are unacceptable for Xpert Xpress SARS-CoV-2/FLU/RSV testing.  Fact Sheet for  Patients: EntrepreneurPulse.com.au  Fact Sheet for Healthcare Providers: IncredibleEmployment.be  This test is not yet approved or cleared by the [place] FDA and has been authorized for detection and/or diagnosis of SARS-CoV-2 by FDA under an Emergency Use Authorization (EUA). This EUA will remain in effect (meaning this test can be used) for the duration of the COVID-19 declaration under Section 564(b)(1) of the Act, 21 U.S.C. section 360bbb-3(b)(1), unless the authorization is terminated or revoked.  Performed at [facility], [place] [address], [place], [place] [address]          Radiology Studies: CARDIAC CATHETERIZATION  Result Date: 06/19/2021   Left dominant coronary anatomy   Widely patent left main   Widely patent LAD but with total occlusion of a large first diagonal   Total occlusion of the distal circumflex which supplied the PDA.  There is total occlusion of the large early arising first obtuse marginal.   No selective engagement of the presumed nondominant right coronary was possible.  Faint opacification of a small conus branch could be seen.  This is the same situation noted in 2003 prior to coronary bypass surgery.   Left ventriculography by hand-injection reveals a large inferobasal aneurysm.  EF less than 50%.  LVEDP is normal.   Atretic LIMA to LAD   60% ostial narrowing of the saphenous vein graft to the first obtuse marginal   Widely patent saphenous vein graft to first diagonal Recommendations Guideline directed therapy for systolic dysfunction. Further evaluation as needed relative to ventricular arrhythmia per EP.        Scheduled Meds:  alfuzosin  10 mg Oral q1800   amiodarone  200 mg Oral BID   Followed by   Derrill Memo ON 06/27/2021] amiodarone  200 mg Oral Daily   aspirin  81 mg Oral Daily   heparin  5,000 Units Subcutaneous Q8H   melatonin  3 mg Oral QHS   metoprolol tartrate  25 mg Oral BID   rosuvastatin   40 mg Oral q1800   sodium chloride flush  3 mL Intravenous Q12H   Continuous Infusions:  sodium chloride       LOS: 5 days    Time spent: 25 minutes    Edwin Dada, MD [facility] 06/21/2021, 9:20 AM     Please page though [facility] [place] or Epic secure chat:  For [employer]  password, contact charge nurse

## 2021-06-21 NOTE — Discharge Instructions (Signed)

## 2021-06-21 NOTE — Discharge Summary (Signed)
Physician Discharge Summary  Nicholas [name] [id number] DOB: 1956/08/01 DOA: 06/16/2021  PCP: Nicholas [name], Nicholas Sa, MD  Admit date: 06/16/2021 Discharge date: 06/21/2021  Admitted From: Home  Disposition:  Home   Recommendations for Outpatient Follow-up:  Follow up with Dr. Alroy [name] PCP in 1 week Follow up with Cardiology as recommended      Home Health: None  Equipment/Devices: None new  Discharge Condition: Good  CODE STATUS: FULL Diet recommendation: Cardiac  Brief/Interim Summary: Nicholas [name] is a 65 y.o. M with CAD s/p CABG 2003 and HTN who presented with dizziness and chest discomfort.  In the ER, ECG showed no ischemic changes but bigeminy noted.  Troponins low but rising.      PRINCIPAL HOSPITAL DIAGNOSIS: NSTEMI    Discharge Diagnoses:   NSTEMI Patient admitted, troponins continued to rise and he was started on heparin.  Troponin peaked at 292.  Relatively asymptomatic.    LHC on 9/12 showed patent LAD, some diffuse other disease, medical therapy recommended.  Echo obtained this admission, EF 45-50%   Transitioned off heparin.  Remained pain free and without exertional symptoms.        Non-sustained ventricular tachycardia Noted on admission to have dizziness with frequent ectopy.  After admission, noted on telemetry to have episodes of NSVT.     EP were consulted, noted his EKG shows a predominantly monomorphic PVC with a left superior axis, whose morphology suggests it originates in that basal inferior aneurysm   Electrophysiology recommend cardiac MRI, transition to oral amiodarone, monitoring of CMP, thyroid studies, and no ICD unless indicated by cardiac MRI   Cardiac MRI consistent with ischemic scar.  Started on amiodarone with plans to transition to once daily on 9/20 and follow up closely with Cardiology.     Hypertension HCTZ changed to metoprolol   BPH   CKD stage IIIa   Chronic systolic CHF Acute CHF ruled out. Diastolic CHF ruled out.    Appeared euvolemic.  EF this admission is 45-50%.                Discharge Instructions  Discharge Instructions     Amb Referral to Cardiac Rehabilitation   Complete by: As directed    Diagnosis: NSTEMI   After initial evaluation and assessments completed: Virtual Based Care may be provided alone or in conjunction with Phase 2 Cardiac Rehab based on patient barriers.: Yes   Discharge instructions   Complete by: As directed    From Dr. Loleta [name]: You were admitted for dizziness.  Here we found that you had had a small heart attack, and also that you had dizziness from abnormal heart rhythms (abnormal electrical rhythms) coming from the aneurysm in your heart.  You started the medicine amiodarone to suppress these abnormal rhythms Take amiodarone 200 mg twice daily for the next 5 days, then on 9/20 reduce to 200 mg once daily  Also: STOP simvastatin and start the cholesterol medicine Crestor STOP the blood pressure medicine hydrochlorothiazide/HCTZ and start the medicine metoprolol twice daily  Go see the heart team as directed below in the To Do section   Increase activity slowly   Complete by: As directed       Allergies as of 06/21/2021   No Known Allergies      Medication List     STOP taking these medications    diclofenac 75 MG EC tablet Commonly known as: VOLTAREN   hydrochlorothiazide 25 MG tablet Commonly known as: HYDRODIURIL   ibuprofen 600 MG tablet  Commonly known as: ADVIL   naproxen 500 MG tablet Commonly known as: Naprosyn   pantoprazole 40 MG tablet Commonly known as: PROTONIX   predniSONE 10 MG tablet Commonly known as: DELTASONE   simvastatin 40 MG tablet Commonly known as: ZOCOR       TAKE these medications    alfuzosin 10 MG 24 hr tablet Commonly known as: UROXATRAL Take 1 tablet (10 mg total) by mouth daily.   amiodarone 200 MG tablet Commonly known as: PACERONE Take 1 tablet (200 mg total) by mouth 2 (two) times daily  for 5 days.   amiodarone 200 MG tablet Commonly known as: PACERONE Take 1 tablet (200 mg total) by mouth daily. Start taking on: June 27, 2021   aspirin EC 81 MG tablet Take 81 mg by mouth daily with breakfast.   metoprolol tartrate 25 MG tablet Commonly known as: LOPRESSOR Take 1 tablet (25 mg total) by mouth 2 (two) times daily.   nitroGLYCERIN 0.4 MG SL tablet Commonly known as: NITROSTAT Place 1 tablet (0.4 mg total) under the tongue every 5 (five) minutes x 3 doses as needed for chest pain.   rosuvastatin 40 MG tablet Commonly known as: CRESTOR Take 1 tablet (40 mg total) by mouth daily at 6 PM.        Follow-up Information     Nicholas [name], Bhavinkumar, PA Follow up on 07/13/2021.   Specialty: Cardiology Why: Please arrive 15 minutes early for your 10:15am post-hospital cardiology appoitnment Contact information: Nicholas [name] [place] [address] [phone]                No Known Allergies  Consultations: Cardiology EP   Procedures/Studies: CARDIAC CATHETERIZATION  Result Date: 06/19/2021   Left dominant coronary anatomy   Widely patent left main   Widely patent LAD but with total occlusion of a large first diagonal   Total occlusion of the distal circumflex which supplied the PDA.  There is total occlusion of the large early arising first obtuse marginal.   No selective engagement of the presumed nondominant right coronary was possible.  Faint opacification of a small conus branch could be seen.  This is the same situation noted in 2003 prior to coronary bypass surgery.   Left ventriculography by hand-injection reveals a large inferobasal aneurysm.  EF less than 50%.  LVEDP is normal.   Atretic LIMA to LAD   60% ostial narrowing of the saphenous vein graft to the first obtuse marginal   Widely patent saphenous vein graft to first diagonal Recommendations Guideline directed therapy for systolic dysfunction. Further evaluation as needed relative  to ventricular arrhythmia per EP.   DG Chest Port 1 View  Result Date: 06/16/2021 CLINICAL DATA:  Pain, midsternal EXAM: PORTABLE CHEST 1 VIEW COMPARISON:  Chest radiograph 05/21/2014 FINDINGS: Median sternotomy wires and mediastinal surgical material is again seen. The superior most wire is fractured, unchanged. The cardiomediastinal silhouette is normal. There is no focal consolidation or pulmonary edema. There is no pleural effusion or pneumothorax. There is no acute osseous abnormality. IMPRESSION: No radiographic evidence of acute cardiopulmonary process. Electronically Signed   By: Valetta Mole M.D.   On: 06/16/2021 08:20   MR CARDIAC MORPHOLOGY W WO CONTRAST  Result Date: 06/21/2021 CLINICAL DATA:  Clinical question of sarcoidosis and ventricular tachycardia Study assumes HCT of 35 and BSA of 1.9 EXAM: CARDIAC MRI TECHNIQUE: The patient was scanned on a 1.5 Tesla GE magnet. A dedicated cardiac coil was used. Functional imaging  was done using Fiesta sequences. 2,3, and 4 chamber views were done to assess for RWMA's. Modified Simpson's rule using a short axis stack was used to calculate an ejection fraction on a dedicated work Conservation officer, nature. The patient received 7 cc of Gadavist. After 10 minutes inversion recovery sequences were used to assess for infiltration and scar tissue. CONTRAST:  7 cc  of Gadavist FINDINGS: 1. Normal left ventricular size, with LVEDD 43 mm, and LVEDVi 67 mL/m2. Normal left ventricular thickness, with intraventricular septal thickness of 8 mm, posterior wall thickness of 9 mm, and septal to posterior ratio < 1.5. Myocardial mass index normal at 70 g/m2. Mildly reduced left ventricular systolic function (LVEF [id number]). There is inferior and inferoseptal dyskinesis. Left ventricular parametric mapping notable for significant elevated in the ECV signal in the areas of dyskinesis (basal inferior 71% with increase inferolateral ECV of 49% base and 44% mid) and normal T2  signal. There is late gadolinium enhancement in the left ventricular myocardium. Transmural in the inferoseptal and inferior base with associated thinning, there is up to 50% subendocardial LGE in the inferolateral based to mid. 2. Normal right ventricular size with RVEDVI 80 mL/m2. Normal right ventricular thickness. Moderate right ventricular systolic dysfunction (RVEF =32%). Abnormal basal RV function without aneurysm. 3.  Normal left and right atrial size. 4. Normal size of the aortic root, ascending aorta and pulmonary artery. 5.  Valve disease as below. Tri-leaflet aortic valve. Not suggestive of significant aortic stenosis, mild to moderate regurgitation by regurgitation fraction (16%), though this may be overestimated in the setting of notable artifact. Pulmonary valve not suggestive of significant stenosis or regurgitation Tricuspid valve with qualitatively mild regurgitation. Mitral valve is central and mild based on LV to RV comparison (given flow related artifact). 6.  Normal pericardium.  No pericardial effusion. 7. Grossly, no extracardiac findings. Recommended dedicated study if concerned for non-cardiac pathology. 8. Notable PVCs during procedure decreases accuracy of volumetric assessment and valve quantification. There is artifact related to sternotomy wires. Breath hold artifact during flow acquisitions. IMPRESSION: LGE and myocardial thinning with normal T2 signal: Lack of edema and subendocardial pattern are more consistent with ischemic disease than sarcoidosis. Functional assessment limited in the setting of breath hold artifact and PVCs. Rudean Haskell MD Electronically Signed   By: Rudean Haskell M.D.   On: 06/21/2021 13:21   ECHOCARDIOGRAM COMPLETE  Result Date: 06/16/2021    ECHOCARDIOGRAM REPORT   Patient Name:   VISHAAN TURENNE Date of Exam: 06/16/2021 Medical Rec #:  [id number]      Height:       71.0 in Accession #:    [id number]     Weight:       164.1 lb Date of Birth:   1956/07/19      BSA:          1.939 m Patient Age:    65 years       BP:           138/71 mmHg Patient Gender: M              HR:           69 bpm. Exam Location:  Inpatient Procedure: 2D Echo, Cardiac Doppler and Color Doppler Indications:    Chest pain  History:        Patient has prior history of Echocardiogram examinations, most                 recent  06/11/2014. CAD and Previous Myocardial Infarction, Prior                 CABG; Arrythmias:PVC.  Sonographer:    Merrie Roof RDCS Referring Phys: Alexis  1. Frequent PVCs. Left ventricular ejection fraction, by estimation, is 45 to 50%. The left ventricle has mildly decreased function. The left ventricle demonstrates regional wall motion abnormalities. Akinesis of basal inferior wall. There is moderate left ventricular hypertrophy. Left ventricular diastolic parameters are indeterminate.  2. Right ventricular systolic function is mildly reduced. The right ventricular size is normal. Tricuspid regurgitation signal is inadequate for assessing PA pressure.  3. The mitral valve is normal in structure. Mild mitral valve regurgitation. No evidence of mitral stenosis.  4. The aortic valve is tricuspid. Aortic valve regurgitation is trivial. No aortic stenosis is present.  5. The inferior vena cava is normal in size with greater than 50% respiratory variability, suggesting right atrial pressure of 3 mmHg. Conclusion(s)/Recommendation(s): Given frequent PVCs and thinning/akinesis in noncoronary distribution (localized to basal inferior wall), would consider sarcoidosis and recommend cardiac MRI for further evaluation. FINDINGS  Left Ventricle: Left ventricular ejection fraction, by estimation, is 45 to 50%. The left ventricle has mildly decreased function. The left ventricle demonstrates regional wall motion abnormalities. The left ventricular internal cavity size was normal in size. There is moderate left ventricular hypertrophy. Left ventricular  diastolic parameters are indeterminate. Right Ventricle: The right ventricular size is normal. Right vetricular wall thickness was not well visualized. Right ventricular systolic function is mildly reduced. Tricuspid regurgitation signal is inadequate for assessing PA pressure. Left Atrium: Left atrial size was normal in size. Right Atrium: Right atrial size was normal in size. Pericardium: There is no evidence of pericardial effusion. Mitral Valve: The mitral valve is normal in structure. Mild mitral valve regurgitation. No evidence of mitral valve stenosis. Tricuspid Valve: The tricuspid valve is normal in structure. Tricuspid valve regurgitation is trivial. Aortic Valve: The aortic valve is tricuspid. Aortic valve regurgitation is trivial. No aortic stenosis is present. Pulmonic Valve: The pulmonic valve was not well visualized. Pulmonic valve regurgitation is trivial. Aorta: The aortic root and ascending aorta are structurally normal, with no evidence of dilitation. Venous: The inferior vena cava is normal in size with greater than 50% respiratory variability, suggesting right atrial pressure of 3 mmHg. IAS/Shunts: The interatrial septum was not well visualized.  LEFT VENTRICLE PLAX 2D LVIDd:         4.90 cm LVIDs:         4.00 cm LV PW:         1.50 cm LV IVS:        1.50 cm LVOT diam:     2.10 cm LV SV:         60 LV SV Index:   31 LVOT Area:     3.46 cm  LV Volumes (MOD) LV vol d, MOD A4C: 126.0 ml LV vol s, MOD A4C: 51.6 ml LV SV MOD A4C:     126.0 ml RIGHT VENTRICLE RV Basal diam:  3.50 cm LEFT ATRIUM           Index       RIGHT ATRIUM           Index LA diam:      3.10 cm 1.60 cm/m  RA Area:     15.10 cm LA Vol (A4C): 42.8 ml 22.08 ml/m RA Volume:   36.30 ml  18.73 ml/m  AORTIC  VALVE LVOT Vmax:   101.00 cm/s LVOT Vmean:  68.700 cm/s LVOT VTI:    0.172 m  AORTA Ao Root diam: 3.50 cm Ao Asc diam:  3.40 cm  SHUNTS Systemic VTI:  0.17 m Systemic Diam: 2.10 cm Oswaldo Milian MD Electronically signed  by Oswaldo Milian MD Signature Date/Time: 06/16/2021/10:35:35 PM    Final       Subjective: Ambulated with physical therapy, felt well.  No chest pain.  Little dizziness.  No confusion, focal weakness, numbness.  Breathing is okay.  No swelling.  Discharge Exam: Vitals:   06/21/21 0500 06/21/21 1206  BP: 125/76 118/76  Pulse: 75 (!) 55  Resp: 18 18  Temp: 98.2 F (36.8 C) 98.3 F (36.8 C)  SpO2: 99%    Vitals:   06/20/21 2112 06/20/21 2334 06/21/21 0500 06/21/21 1206  BP: 128/80 121/79 125/76 118/76  Pulse: (!) 59 65 75 (!) 55  Resp: '16 18 18 18  '$ Temp: 98.3 F (36.8 C) 98.2 F (36.8 C) 98.2 F (36.8 C) 98.3 F (36.8 C)  TempSrc: Oral Oral Oral Oral  SpO2: 100% 99% 99%   Weight:      Height:        General appearance:  adult male, alert and in no acute distress.  On the edge of the bed, eating lunch. HEENT: Anicteric, conjunctiva pink, lids and lashes normal. No nasal deformity, discharge, epistaxis.  Lips moist.   Skin: Warm and dry.  no jaundice.  No suspicious rashes or lesions. Cardiac: RRR, nl S1-S2, no murmurs appreciated.  Capillary refill is brisk.  No JVD, no LE edema.  Radial  pulses 2+ and symmetric. Respiratory: Normal respiratory rate and rhythm.  CTAB without rales or wheezes. Abdomen:     MSK: No deformities or effusions. Neuro: Awake and alert.  EOMI, moves all extremities. Speech fluent.    Psych: Sensorium intact and responding to questions, attention normal. Affect appropriate.  Judgment and insight appear normal.   The results of significant diagnostics from this hospitalization (including imaging, microbiology, ancillary and laboratory) are listed below for reference.     Microbiology: Recent Results (from the past 240 hour(s))  Resp Panel by RT-PCR (Flu A&B, Covid) Nasopharyngeal Swab     Status: None   Collection Time: 06/16/21  8:02 PM   Specimen: Nasopharyngeal Swab; Nasopharyngeal(NP) swabs in vial transport medium  Result Value Ref  Range Status   SARS Coronavirus 2 by RT PCR NEGATIVE NEGATIVE Final    Comment: (NOTE) SARS-CoV-2 target nucleic acids are NOT DETECTED.  The SARS-CoV-2 RNA is generally detectable in upper respiratory specimens during the acute phase of infection. The lowest concentration of SARS-CoV-2 viral copies this assay can detect is 138 copies/mL. A negative result does not preclude SARS-Cov-2 infection and should not be used as the sole basis for treatment or other patient management decisions. A negative result may occur with  improper specimen collection/handling, submission of specimen other than nasopharyngeal swab, presence of viral mutation(s) within the areas targeted by this assay, and inadequate number of viral copies(<138 copies/mL). A negative result must be combined with clinical observations, patient history, and epidemiological information. The expected result is Negative.  Fact Sheet for Patients:  EntrepreneurPulse.com.au  Fact Sheet for Healthcare Providers:  IncredibleEmployment.be  This test is no t yet approved or cleared by the [place] FDA and  has been authorized for detection and/or diagnosis of SARS-CoV-2 by FDA under an Emergency Use Authorization (EUA). This EUA will remain  in  effect (meaning this test can be used) for the duration of the COVID-19 declaration under Section 564(b)(1) of the Act, 21 U.S.C.section 360bbb-3(b)(1), unless the authorization is terminated  or revoked sooner.       Influenza A by PCR NEGATIVE NEGATIVE Final   Influenza B by PCR NEGATIVE NEGATIVE Final    Comment: (NOTE) The Xpert Xpress SARS-CoV-2/FLU/RSV plus assay is intended as an aid in the diagnosis of influenza from Nasopharyngeal swab specimens and should not be used as a sole basis for treatment. Nasal washings and aspirates are unacceptable for Xpert Xpress SARS-CoV-2/FLU/RSV testing.  Fact Sheet for  Patients: EntrepreneurPulse.com.au  Fact Sheet for Healthcare Providers: IncredibleEmployment.be  This test is not yet approved or cleared by the [place] FDA and has been authorized for detection and/or diagnosis of SARS-CoV-2 by FDA under an Emergency Use Authorization (EUA). This EUA will remain in effect (meaning this test can be used) for the duration of the COVID-19 declaration under Section 564(b)(1) of the Act, 21 U.S.C. section 360bbb-3(b)(1), unless the authorization is terminated or revoked.  Performed at [facility], [place] [address], [place], [place] [address]      Labs: BNP (last 3 results) No results for input(s): BNP in the last 8760 hours. Basic Metabolic Panel: Recent Labs  Lab 06/16/21 0800 06/16/21 1640 06/17/21 0245 06/18/21 0111 06/18/21 0948 06/19/21 0225 06/20/21 0222  NA 136  --  137 137  --  136 136  K 4.3  --  4.0 3.9  --  4.1 3.9  CL 106  --  105 107  --  108 107  CO2 22  --  23 21*  --  22 22  GLUCOSE 104*  --  93 99  --  115* 93  BUN 17  --  18 14  --  13 12  CREATININE 1.32*  --  1.28* 1.31*  --  1.41* 1.33*  CALCIUM 8.8*  --  9.3 9.1  --  9.1 9.0  MG  --   --   --   --  1.9 2.0 1.7  PHOS  --  2.8  --   --   --   --   --    Liver Function Tests: No results for input(s): AST, ALT, ALKPHOS, BILITOT, PROT, ALBUMIN in the last 168 hours. No results for input(s): LIPASE, AMYLASE in the last 168 hours. No results for input(s): AMMONIA in the last 168 hours. CBC: Recent Labs  Lab 06/16/21 0800 06/18/21 0111 06/19/21 0225 06/20/21 0222  WBC 6.4 7.2 6.3 7.1  NEUTROABS 4.5  --   --   --   HGB 12.7* 13.0 12.4* 12.2*  HCT 37.9* 38.2* 36.1* 35.4*  MCV 84.4 83.4 82.2 82.5  PLT 168 138* 155 146*   Cardiac Enzymes: No results for input(s): CKTOTAL, CKMB, CKMBINDEX, TROPONINI in the last 168 hours. BNP: Invalid input(s): POCBNP CBG: No results for input(s): GLUCAP in the last 168  hours. D-Dimer No results for input(s): DDIMER in the last 72 hours. Hgb A1c No results for input(s): HGBA1C in the last 72 hours. Lipid Profile No results for input(s): CHOL, HDL, LDLCALC, TRIG, CHOLHDL, LDLDIRECT in the last 72 hours. Thyroid function studies No results for input(s): TSH, T4TOTAL, T3FREE, THYROIDAB in the last 72 hours.  Invalid input(s): FREET3 Anemia work up No results for input(s): VITAMINB12, FOLATE, FERRITIN, TIBC, IRON, RETICCTPCT in the last 72 hours. Urinalysis No results found for: COLORURINE, APPEARANCEUR, [place], [place], [place], [place], [place], [place],  PROTEINUR, UROBILINOGEN, NITRITE, LEUKOCYTESUR Sepsis Labs Invalid input(s): PROCALCITONIN,  WBC,  LACTICIDVEN Microbiology Recent Results (from the past 240 hour(s))  Resp Panel by RT-PCR (Flu A&B, Covid) Nasopharyngeal Swab     Status: None   Collection Time: 06/16/21  8:02 PM   Specimen: Nasopharyngeal Swab; Nasopharyngeal(NP) swabs in vial transport medium  Result Value Ref Range Status   SARS Coronavirus 2 by RT PCR NEGATIVE NEGATIVE Final    Comment: (NOTE) SARS-CoV-2 target nucleic acids are NOT DETECTED.  The SARS-CoV-2 RNA is generally detectable in upper respiratory specimens during the acute phase of infection. The lowest concentration of SARS-CoV-2 viral copies this assay can detect is 138 copies/mL. A negative result does not preclude SARS-Cov-2 infection and should not be used as the sole basis for treatment or other patient management decisions. A negative result may occur with  improper specimen collection/handling, submission of specimen other than nasopharyngeal swab, presence of viral mutation(s) within the areas targeted by this assay, and inadequate number of viral copies(<138 copies/mL). A negative result must be combined with clinical observations, patient history, and epidemiological information. The expected result is Negative.  Fact Sheet for Patients:   EntrepreneurPulse.com.au  Fact Sheet for Healthcare Providers:  IncredibleEmployment.be  This test is no t yet approved or cleared by the [place] FDA and  has been authorized for detection and/or diagnosis of SARS-CoV-2 by FDA under an Emergency Use Authorization (EUA). This EUA will remain  in effect (meaning this test can be used) for the duration of the COVID-19 declaration under Section 564(b)(1) of the Act, 21 U.S.C.section 360bbb-3(b)(1), unless the authorization is terminated  or revoked sooner.       Influenza A by PCR NEGATIVE NEGATIVE Final   Influenza B by PCR NEGATIVE NEGATIVE Final    Comment: (NOTE) The Xpert Xpress SARS-CoV-2/FLU/RSV plus assay is intended as an aid in the diagnosis of influenza from Nasopharyngeal swab specimens and should not be used as a sole basis for treatment. Nasal washings and aspirates are unacceptable for Xpert Xpress SARS-CoV-2/FLU/RSV testing.  Fact Sheet for Patients: EntrepreneurPulse.com.au  Fact Sheet for Healthcare Providers: IncredibleEmployment.be  This test is not yet approved or cleared by the [place] FDA and has been authorized for detection and/or diagnosis of SARS-CoV-2 by FDA under an Emergency Use Authorization (EUA). This EUA will remain in effect (meaning this test can be used) for the duration of the COVID-19 declaration under Section 564(b)(1) of the Act, 21 U.S.C. section 360bbb-3(b)(1), unless the authorization is terminated or revoked.  Performed at [facility], [place] [address] [place], [place] [address]      Time coordinating discharge: 25 minutes The [place] controlled substances registry was reviewed for this patient     30 Day Unplanned Readmission Risk Score    Flowsheet Row ED to Hosp-Admission (Current) from 06/16/2021 in [facility]  30 Day Unplanned Readmission Risk Score (%) 10.32 Filed at  06/21/2021 1600       This score is the patient's risk of an unplanned readmission within 30 days of being discharged (0 -100%). The score is based on dignosis, age, lab data, medications, orders, and past utilization.   Low:  0-14.9   Medium: 15-21.9   High: 22-29.9   Extreme: 30 and above            SIGNED:   Edwin Dada, MD  [facility] 06/21/2021, 4:45 PM

## 2021-06-23 ENCOUNTER — Other Ambulatory Visit: Payer: Self-pay | Admitting: *Deleted

## 2021-06-23 NOTE — Patient Outreach (Signed)
[facility]) Care Management  06/23/2021  Nicholas [name] 20-May-1956 [id number]   Transition of care telephone call  Referral received: 06/19/21 Initial outreach:06/23/21 Insurance: [facility]   Initial unsuccessful telephone call to patient's preferred number in order to complete transition of care assessment; no answer, left HIPAA compliant voicemail message requesting return call. Placed call to patient wife Drystan Bouse, [place] no answer mailbox is full.   Objective: Per the electronic medical record, [name]  was hospitalized at [facility] 9/9-9/14/22 for Chest Pain, NSTEMI , Non sustained VT . Comorbidities include: Hx CABG,  Hyperlipidemia, He was discharged to home on 06/21/21 without the need for home health services or durable medical equipment per the discharge summary, Referral to outpatient cardiac rehab.    Plan: This RNCM will route unsuccessful outreach letter with [place] Management pamphlet and 24 hour Nurse Advice Line Magnet to [place] Management clinical pool to be mailed to patient's home address. This RNCM will attempt another outreach within 4 business days.   Joylene Draft, RN, BSN  [place] Management Coordinator  [phone]- Mobile [phone]- Toll Free Main Office

## 2021-06-26 ENCOUNTER — Telehealth (HOSPITAL_COMMUNITY): Payer: Self-pay

## 2021-06-26 ENCOUNTER — Other Ambulatory Visit (HOSPITAL_COMMUNITY): Payer: Self-pay

## 2021-06-26 NOTE — Telephone Encounter (Signed)
Pt insurance is active and benefits verified through [place] 0, DED 0/0 met, out of pocket $7,900/$810.43 met, co-insurance 0%. no pre-authorization required. [place]/UMR 06/26/2021_0 :06pm, REF# [id number]   Will contact patient to see if he is interested in the Cardiac Rehab Program. If interested, patient will need to complete follow up appt. Once completed, patient will be contacted for scheduling upon review by the RN Navigator.

## 2021-06-26 NOTE — Telephone Encounter (Signed)
Called patient to see if he is interested in the Cardiac Rehab Program. Patient expressed interest. Explained scheduling process and went over insurance, patient verbalized understanding. Will contact patient for scheduling once f/u has been completed.  °

## 2021-06-27 ENCOUNTER — Other Ambulatory Visit (HOSPITAL_COMMUNITY): Payer: Self-pay

## 2021-06-27 ENCOUNTER — Other Ambulatory Visit: Payer: Self-pay | Admitting: *Deleted

## 2021-06-27 MED ORDER — NITROGLYCERIN 0.4 MG SL SUBL
0.4000 mg | SUBLINGUAL_TABLET | SUBLINGUAL | 12 refills | Status: DC | PRN
  Filled 2021-06-27: qty 25, 5d supply, fill #0
  Filled 2022-03-12: qty 25, 5d supply, fill #1

## 2021-06-28 ENCOUNTER — Other Ambulatory Visit (HOSPITAL_COMMUNITY): Payer: Self-pay

## 2021-06-28 ENCOUNTER — Other Ambulatory Visit: Payer: Self-pay | Admitting: *Deleted

## 2021-06-28 ENCOUNTER — Encounter: Payer: Self-pay | Admitting: *Deleted

## 2021-06-28 NOTE — Patient Outreach (Signed)
[facility]) Care Management  06/28/2021  Nicholas [name] June 15, 1956 [id number]   Transition of care call/case closure   Referral received:9/12/222 Initial outreach:06/23/21 Insurance: [place] [employer]    Subjective: 2nd successful telephone call to patient's preferred number in order to complete transition of care assessment;  He reports being at work now and request return call around 3pm.  Return call to patient  2 HIPAA identifiers verified. Explained purpose of call and completed transition of care assessment.  Patient states that he is doing okay. She  denies post-procedure  problems, cardiac cath site unremarkable. He denies chest pain or shortness of breath, dizziness. In reviewing medication patient states that he just got his prescription for nitroglycerin sent to pharmacy on yesterday and he will pick it up today. Reviewed with patient with teachback how to use for chest pain.  He reports returning to work on this week and tolerating well.  He is tolerating diet, denies bowel or bladder problems.  Reviewed accessing the following [place] Benefits : He discussed  ongoing health issues CAD, elevated cholesterol , discussed [place] benefit of chronic disease management states he will speak with his wife about program.   He uses a [facility] outpatient pharmacy at [place].     Objective:   [name]  was hospitalized at [facility] 9/9-9/14/22 for Chest Pain, NSTEMI ,Cardiac cath, no intervention performed,  Non sustained VT . Comorbidities include: Hx CABG,  Hyperlipidemia, He was discharged to home on 06/21/21 without the need for home health services or durable medical equipment per the discharge summary, Referral to outpatient cardiac rehab.   Assessment:  Patient voices good understanding of all discharge instructions.  See transition of care flowsheet for assessment details.   Plan:  Reviewed hospital discharge diagnosis  of NSTEMI,   and discharge treatment plan using hospital discharge instructions, assessing medication adherence, reviewing problems requiring provider notification, and discussing the importance of follow up with surgeon, primary care provider and/or specialists as directed.  Reviewed  healthy lifestyle program information to receive discounted premium for  2023   Step 1: Get  your annual physical  Step 2: Complete your health assessment  Step 3:Identify your current health status and complete the corresponding action step between October 08, 2020 and June 08, 2021.    No ongoing care management needs identified so will close case to [place] Management services and route successful outreach letter with [place] Management pamphlet and 24 Hour Nurse Line Magnet to [place] Management clinical pool to be mailed to patient's home address.   Joylene Draft, RN, BSN  [place] Management Coordinator  [phone]- Mobile [phone]- Toll Free Main Office

## 2021-07-11 DIAGNOSIS — K219 Gastro-esophageal reflux disease without esophagitis: Secondary | ICD-10-CM | POA: Diagnosis not present

## 2021-07-11 DIAGNOSIS — E7800 Pure hypercholesterolemia, unspecified: Secondary | ICD-10-CM | POA: Diagnosis not present

## 2021-07-11 DIAGNOSIS — Z125 Encounter for screening for malignant neoplasm of prostate: Secondary | ICD-10-CM | POA: Diagnosis not present

## 2021-07-11 DIAGNOSIS — N400 Enlarged prostate without lower urinary tract symptoms: Secondary | ICD-10-CM | POA: Diagnosis not present

## 2021-07-11 DIAGNOSIS — I2510 Atherosclerotic heart disease of native coronary artery without angina pectoris: Secondary | ICD-10-CM | POA: Diagnosis not present

## 2021-07-11 DIAGNOSIS — Z23 Encounter for immunization: Secondary | ICD-10-CM | POA: Diagnosis not present

## 2021-07-11 DIAGNOSIS — Z0000 Encounter for general adult medical examination without abnormal findings: Secondary | ICD-10-CM | POA: Diagnosis not present

## 2021-07-11 DIAGNOSIS — I10 Essential (primary) hypertension: Secondary | ICD-10-CM | POA: Diagnosis not present

## 2021-07-11 DIAGNOSIS — M2550 Pain in unspecified joint: Secondary | ICD-10-CM | POA: Diagnosis not present

## 2021-07-13 ENCOUNTER — Encounter: Payer: Self-pay | Admitting: Physician Assistant

## 2021-07-13 ENCOUNTER — Ambulatory Visit: Payer: 59 | Admitting: Physician Assistant

## 2021-07-13 ENCOUNTER — Other Ambulatory Visit (HOSPITAL_COMMUNITY): Payer: Self-pay

## 2021-07-13 ENCOUNTER — Other Ambulatory Visit: Payer: Self-pay

## 2021-07-13 VITALS — BP 120/80 | HR 94 | Ht 71.5 in | Wt 170.8 lb

## 2021-07-13 DIAGNOSIS — I10 Essential (primary) hypertension: Secondary | ICD-10-CM

## 2021-07-13 DIAGNOSIS — E782 Mixed hyperlipidemia: Secondary | ICD-10-CM

## 2021-07-13 DIAGNOSIS — I25700 Atherosclerosis of coronary artery bypass graft(s), unspecified, with unstable angina pectoris: Secondary | ICD-10-CM | POA: Diagnosis not present

## 2021-07-13 DIAGNOSIS — I5042 Chronic combined systolic (congestive) and diastolic (congestive) heart failure: Secondary | ICD-10-CM | POA: Diagnosis not present

## 2021-07-13 DIAGNOSIS — I4729 Other ventricular tachycardia: Secondary | ICD-10-CM

## 2021-07-13 MED ORDER — METOPROLOL TARTRATE 50 MG PO TABS
50.0000 mg | ORAL_TABLET | Freq: Two times a day (BID) | ORAL | 3 refills | Status: DC
  Filled 2021-07-13: qty 180, 90d supply, fill #0

## 2021-07-13 NOTE — Progress Notes (Signed)
Cardiology Office Note:    Date:  07/13/2021   ID:  Nicholas [name], DOB [birth year], MRN [id number]  PCP:  Aurea Graff.Marlou Sa, MD  [place] Cardiologist:  Candee Furbish, MD  [facility] Electrophysiologist:  Vickie Epley, MD   Chief Complaint: Hospital follow up  History of Present Illness:    Nicholas [name] is a 65 y.o. male with a hx of CAD s/p CABG '03, HTN, HLD, presycope presents for hospital follow up.   Admitted 06/2021 for dizziness and chest pain. EKG showed SB, bigeminy known TWI in inferolateral leads. Trop was peaked at 292.   Echocardiogram showed EF 45-50%, akinesis of basal inferior wall, moderate LVH, indeterminate LV diastolic function, mildly reduced RV systolic function, and mild MR.  He underwent LHR which showed severe native RCA, LCx, OM, and D1 stenosis with atretic LIMA, 60% SVG to OM1 stenosis, and patent SVG to D1.  No intervention performed.  He was noted to have episodes of NSVT with presentation concerning for possible ventricular arrhythmia.  He was started on IV amiodarone and metoprolol uptitrated to  25 mg twice daily.  EP evaluated patient and agreed with cardiac MR to further characterize LV scar burden and LV function. Cardiac MRI was more consistent with ischemic disease than sarcoidosis. Discharge on PO amiodarone.   Patient here for follow-up with daughter who was previously 3e notes.  Patient denies chest pain, dizziness, palpitation, orthopnea, PND, syncope, lower extremity edema, melena or blood in his stool or urine.  He walks 10 minutes at a time.  He has noted occasional dyspnea with activity without chest tightness.    Past Medical History:  Diagnosis Date   Arthritis    knees, hands   Cataract    Coronary artery disease    Dysrhythmia 06/2014   sinus brady with PVC'S   GERD (gastroesophageal reflux disease)    Hepatitis C    Hx of adenomatous colonic polyps 09/21/2014   Hypercholesteremia    Hypertension    Myocardial infarction  [facility])     Past Surgical History:  Procedure Laterality Date   CARDIAC SURGERY     triple bypass   COLONOSCOPY     CORONARY ARTERY BYPASS GRAFT     CABG x 3 Vessels   LEFT HEART CATH AND CORS/GRAFTS ANGIOGRAPHY N/A 06/19/2021   Procedure: LEFT HEART CATH AND CORS/GRAFTS ANGIOGRAPHY;  Surgeon: Belva Crome, MD;  Location: [facility];  Service: Cardiovascular;  Laterality: N/A;    Current Medications: Current Meds  Medication Sig   alfuzosin (UROXATRAL) 10 MG 24 hr tablet Take 1 tablet (10 mg total) by mouth daily.   amiodarone (PACERONE) 200 MG tablet Take 1 tablet (200 mg total) by mouth daily.   aspirin EC 81 MG tablet Take 81 mg by mouth daily with breakfast.   metoprolol tartrate (LOPRESSOR) 50 MG tablet Take 1 tablet (50 mg total) by mouth 2 (two) times daily.   nitroGLYCERIN (NITROSTAT) 0.4 MG SL tablet Place 1 tablet (0.4 mg total) under the tongue every 5 (five) minutes x 3 doses as needed for chest pain.   rosuvastatin (CRESTOR) 40 MG tablet Take 1 tablet (40 mg total) by mouth daily at 6 PM.   [DISCONTINUED] metoprolol tartrate (LOPRESSOR) 25 MG tablet Take 1 tablet (25 mg total) by mouth 2 (two) times daily.     Allergies:   Patient has no known allergies.   Social History   Socioeconomic History   Marital status: Married  Spouse name: Not on file   Number of children: Not on file   Years of education: Not on file   Highest education level: Not on file  Occupational History   Not on file  Tobacco Use   Smoking status: Former    Types: Cigarettes    Quit date: 06/10/2002    Years since quitting: 19.1   Smokeless tobacco: Never  Vaping Use   Vaping Use: Never used  Substance and Sexual Activity   Alcohol use: Yes    Alcohol/week: 7.0 standard drinks    Types: 7 Cans of beer per week   Drug use: Yes    Types: Marijuana    Comment: occ   Sexual activity: Yes  Other Topics Concern   Not on file  Social History Narrative   Not on file   Social  Determinants of Health   Financial Resource Strain: Not on file  Food Insecurity: Not on file  Transportation Needs: Not on file  Physical Activity: Not on file  Stress: Not on file  Social Connections: Not on file     Family History: The patient's family history includes Cancer in his mother; Diabetes in his mother; Diabetic kidney disease in his mother; Esophageal cancer in his mother; Hypertension in his father and mother; Kidney disease in his mother; Other in his father; Throat cancer in his mother. There is no history of Heart attack, Colon cancer, Stomach cancer, Rectal cancer, or Colon polyps.    ROS:   Please see the history of present illness.    All other systems reviewed and are negative.   EKGs/Labs/Other Studies Reviewed:    The following studies were reviewed today:  LEFT HEART CATH AND CORS/GRAFTS ANGIOGRAPHY 06/19/21   Conclusion      Left dominant coronary anatomy   Widely patent left main   Widely patent LAD but with total occlusion of a large first diagonal   Total occlusion of the distal circumflex which supplied the PDA.  There is total occlusion of the large early arising first obtuse marginal.   No selective engagement of the presumed nondominant right coronary was possible.  Faint opacification of a small conus branch could be seen.  This is the same situation noted in 2003 prior to coronary bypass surgery.   Left ventriculography by hand-injection reveals a large inferobasal aneurysm.  EF less than 50%.  LVEDP is normal.   Atretic LIMA to LAD   60% ostial narrowing of the saphenous vein graft to the first obtuse marginal   Widely patent saphenous vein graft to first diagonal   Recommendations Guideline directed therapy for systolic dysfunction. Further evaluation as needed relative to ventricular arrhythmia per EP. Diagnostic Dominance: Left     Echo 06/16/21 1. Frequent PVCs. Left ventricular ejection fraction, by estimation, is  45 to 50%. The  left ventricle has mildly decreased function. The left  ventricle demonstrates regional wall motion abnormalities. Akinesis of  basal inferior wall. There is moderate  left ventricular hypertrophy. Left ventricular diastolic parameters are  indeterminate.   2. Right ventricular systolic function is mildly reduced. The right  ventricular size is normal. Tricuspid regurgitation signal is inadequate  for assessing PA pressure.   3. The mitral valve is normal in structure. Mild mitral valve  regurgitation. No evidence of mitral stenosis.   4. The aortic valve is tricuspid. Aortic valve regurgitation is trivial.  No aortic stenosis is present.   5. The inferior vena cava is normal in size with  greater than 50%  respiratory variability, suggesting right atrial pressure of 3 mmHg.   Conclusion(s)/Recommendation(s): Given frequent PVCs and thinning/akinesis  in noncoronary distribution (localized to basal inferior wall), would  consider sarcoidosis and recommend cardiac MRI for further evaluation.    EKG:  EKG is not  ordered today.  Recent Labs: 06/20/2021: BUN 12; Creatinine, Ser 1.33; Hemoglobin 12.2; Magnesium 1.7; Platelets 146; Potassium 3.9; Sodium 136  Recent Lipid Panel    Component Value Date/Time   CHOL 116 06/11/2014 0100   TRIG 60 06/11/2014 0100   HDL 57 06/11/2014 0100   CHOLHDL 2.0 06/11/2014 0100   VLDL 12 06/11/2014 0100   LDLCALC 47 06/11/2014 0100     Physical Exam:    VS:  BP 120/80   Pulse 94   Ht 5' 11.5" (1.816 m)   Wt 170 lb 12.8 oz (77.5 kg)   SpO2 98%   BMI 23.49 kg/m     Wt Readings from Last 3 Encounters:  07/13/21 170 lb 12.8 oz (77.5 kg)  06/16/21 164 lb 1.6 oz (74.4 kg)  02/21/18 167 lb (75.8 kg)     GEN:  Well nourished, well developed in no acute distress HEENT: Normal NECK: No JVD; No carotid bruits LYMPHATICS: No lymphadenopathy CARDIAC: RRR, no murmurs, rubs, gallops RESPIRATORY:  Clear to auscultation without rales, wheezing or  rhonchi  ABDOMEN: Soft, non-tender, non-distended MUSCULOSKELETAL:  No edema; No deformity  SKIN: Warm and dry NEUROLOGIC:  Alert and oriented x 3 PSYCHIATRIC:  Normal affect   ASSESSMENT AND PLAN:    CAD s/p CABG -Patient cardiac catheterization as above.  Patient is having exertional dyspnea without chest tightness.  This is intermittent and symptoms improving.  Likely due to deconditioning related anginal symptoms. -Continue aspirin, statin and beta-blocker  2.  Nonsustained VT -Patient was evaluated by EP and did not felt candidate for ICD. -Cardiac MRI showed ischemic disease with LV function of 42%. -Continue amiodarone -Increase metoprolol tartrate to 50 mg twice daily  3.  Chronic combined CHF -Mildly reduced biventricular function.  Patient is euvolemic.  Cardiac MRI as above. -Increase metoprolol tartrate to 50 mg twice daily.  Daughter and patient prefer not to titrate to succinate currently. -Avoid ACE or ARB until next office visit.  Question about blood pressure will tolerate or not.  Renal function 1.2-1.4.  4.  Hypertension -Blood pressure stable -Increase beta-blocker as above  5.  Hyperlipidemia -Continue Crestor 40 mg daily -LFTs and lipid panel at follow-up  Medication Adjustments/Labs and Tests Ordered: Current medicines are reviewed at length with the patient today.  Concerns regarding medicines are outlined above.  No orders of the defined types were placed in this encounter.  Meds ordered this encounter  Medications   metoprolol tartrate (LOPRESSOR) 50 MG tablet    Sig: Take 1 tablet (50 mg total) by mouth 2 (two) times daily.    Dispense:  180 tablet    Refill:  3     Patient Instructions  Medication Instructions:  Your physician has recommended you make the following change in your medication:   INCREASE the Lopressor to 50 mg taking 1 tablet twice a day. You can take 2 tablets of the 25 mg twice a day    *If you need a refill on your  cardiac medications before your next appointment, please call your pharmacy*   Lab Work: None ordered  If you have labs (blood work) drawn today and your tests are completely normal, you will receive your  results only by: MyChart Message (if you have MyChart) OR A paper copy in the mail If you have any lab test that is abnormal or we need to change your treatment, we will call you to review the results.   Testing/Procedures: None ordered   Follow-Up: At [facility], you and your health needs are our priority.  As part of our continuing mission to provide you with exceptional heart care, we have created designated Provider Care Teams.  These Care Teams include your primary Cardiologist (physician) and Advanced Practice Providers (APPs -  Physician Assistants and Nurse Practitioners) who all work together to provide you with the care you need, when you need it.  We recommend signing up for the patient portal called "MyChart".  Sign up information is provided on this After Visit Summary.  MyChart is used to connect with patients for Virtual Visits (Telemedicine).  Patients are able to view lab/test results, encounter notes, upcoming appointments, etc.  Non-urgent messages can be sent to your provider as well.   To learn more about what you can do with MyChart, go to [email or web address].    Your next appointment:   3 month(s)  11/21/2020 3:40  The format for your next appointment:   In Person  Provider:      Other Instructions    Signed, Leanor Kail, [place]  07/13/2021 10:37 AM    [place]

## 2021-07-13 NOTE — Patient Instructions (Addendum)
Medication Instructions:  Your physician has recommended you make the following change in your medication:   INCREASE the Lopressor to 50 mg taking 1 tablet twice a day. You can take 2 tablets of the 25 mg twice a day    *If you need a refill on your cardiac medications before your next appointment, please call your pharmacy*   Lab Work: None ordered  If you have labs (blood work) drawn today and your tests are completely normal, you will receive your results only by: [place] (if you have MyChart) OR A paper copy in the mail If you have any lab test that is abnormal or we need to change your treatment, we will call you to review the results.   Testing/Procedures: None ordered   Follow-Up: At [facility], you and your health needs are our priority.  As part of our continuing mission to provide you with exceptional heart care, we have created designated Provider Care Teams.  These Care Teams include your primary Cardiologist (physician) and Advanced Practice Providers (APPs -  Physician Assistants and Nurse Practitioners) who all work together to provide you with the care you need, when you need it.  We recommend signing up for the patient portal called "MyChart".  Sign up information is provided on this After Visit Summary.  MyChart is used to connect with patients for Virtual Visits (Telemedicine).  Patients are able to view lab/test results, encounter notes, upcoming appointments, etc.  Non-urgent messages can be sent to your provider as well.   To learn more about what you can do with MyChart, go to [email or web address].    Your next appointment:   3 month(s)  11/21/2020 3:40  The format for your next appointment:   In Person  Provider:      Other Instructions

## 2021-07-24 ENCOUNTER — Other Ambulatory Visit (HOSPITAL_COMMUNITY): Payer: Self-pay

## 2021-08-09 ENCOUNTER — Telehealth (HOSPITAL_COMMUNITY): Payer: Self-pay

## 2021-08-09 NOTE — Telephone Encounter (Signed)
Called patient to see if he was interested in participating in the Cardiac Rehab Program. Patient stated yes. Patient will come in for orientation on 08/22/2021@1 :15pm and will attend the 1:00pm exercise class.   Tourist information centre manager.

## 2021-08-21 ENCOUNTER — Other Ambulatory Visit (HOSPITAL_COMMUNITY): Payer: Self-pay

## 2021-08-21 ENCOUNTER — Telehealth (HOSPITAL_COMMUNITY): Payer: Self-pay | Admitting: *Deleted

## 2021-08-21 NOTE — Telephone Encounter (Signed)
Spoke with the patient confirmed appointment for orientation tomorrow. Will complete health history in person.Barnet Pall, RN,BSN 08/21/2021 5:39 PM

## 2021-08-22 ENCOUNTER — Other Ambulatory Visit: Payer: Self-pay

## 2021-08-22 ENCOUNTER — Encounter (HOSPITAL_COMMUNITY): Payer: Self-pay

## 2021-08-22 ENCOUNTER — Encounter (HOSPITAL_COMMUNITY)
Admission: RE | Admit: 2021-08-22 | Discharge: 2021-08-22 | Disposition: A | Discharge status: Home / Self Care | Payer: 59 | Source: Ambulatory Visit | Attending: Cardiology | Admitting: Cardiology

## 2021-08-22 VITALS — BP 128/78 | HR 43 | Ht 70.75 in | Wt 174.4 lb

## 2021-08-22 DIAGNOSIS — I214 Non-ST elevation (NSTEMI) myocardial infarction: Secondary | ICD-10-CM | POA: Insufficient documentation

## 2021-08-22 NOTE — Progress Notes (Signed)
Cardiac Individual Treatment Plan  Patient Details  Name: Nicholas [name] MRN: [id number] Date of Birth: [birth year] Referring Provider:   Flowsheet Row CARDIAC REHAB PHASE II ORIENTATION from 08/22/2021 in [facility]  Referring Provider Candee Furbish, MD       Initial Encounter Date:  [place] PHASE II ORIENTATION from 08/22/2021 in [facility]  Date 08/22/21       Visit Diagnosis: 06/16/21 NSTEMI Medical treatment  Patient's Home Medications on Admission:  Current Outpatient Medications:    alfuzosin (UROXATRAL) 10 MG 24 hr tablet, Take 1 tablet (10 mg total) by mouth daily., Disp: 90 tablet, Rfl: 4   amiodarone (PACERONE) 200 MG tablet, Take 1 tablet (200 mg total) by mouth daily., Disp: 30 tablet, Rfl: 3   aspirin EC 81 MG tablet, Take 81 mg by mouth daily with breakfast., Disp: , Rfl:    metoprolol tartrate (LOPRESSOR) 50 MG tablet, Take 1 tablet (50 mg total) by mouth 2 (two) times daily., Disp: 180 tablet, Rfl: 3   nitroGLYCERIN (NITROSTAT) 0.4 MG SL tablet, Place 1 tablet (0.4 mg total) under the tongue every 5 (five) minutes x 3 doses as needed for chest pain., Disp: 25 tablet, Rfl: 12   rosuvastatin (CRESTOR) 40 MG tablet, Take 1 tablet (40 mg total) by mouth daily at 6 PM., Disp: 30 tablet, Rfl: 3  Past Medical History: Past Medical History:  Diagnosis Date   Arthritis    knees, hands   Cataract    Coronary artery disease    Dysrhythmia 06/2014   sinus brady with PVC'S   GERD (gastroesophageal reflux disease)    Hepatitis C    Hx of adenomatous colonic polyps 09/21/2014   Hypercholesteremia    Hypertension    Myocardial infarction ([facility])     Tobacco Use: Social History   Tobacco Use  Smoking Status Former   Types: Cigarettes   Quit date: 06/10/2002   Years since quitting: 19.2  Smokeless Tobacco Never    Labs: Recent Review Flowsheet Data     Labs for ITP Cardiac and  Pulmonary Rehab Latest Ref Rng & Units 12/18/2009 06/10/2014 06/11/2014   Cholestrol 0 - 200 mg/dL - - 116   LDLCALC 0 - 99 mg/dL - - 47   HDL >39 mg/dL - - 57   Trlycerides <150 mg/dL - - 60   Hemoglobin A1c <5.7 % - 6.4(H) -   TCO2 0 - 100 mmol/L 26 - -       Capillary Blood Glucose: No results found for: GLUCAP   Exercise Target Goals: Exercise Program Goal: Individual exercise prescription set using results from initial 6 min walk test and THRR while considering  patient's activity barriers and safety.   Exercise Prescription Goal: Starting with aerobic activity 30 plus minutes a day, 3 days per week for initial exercise prescription. Provide home exercise prescription and guidelines that participant acknowledges understanding prior to discharge.  Activity Barriers & Risk Stratification:  Activity Barriers & Cardiac Risk Stratification - 08/22/21 1446       Activity Barriers & Cardiac Risk Stratification   Activity Barriers Deconditioning;Shortness of Breath    Cardiac Risk Stratification High             6 Minute Walk:  6 Minute Walk     Row Name 08/22/21 1353         6 Minute Walk   Phase Initial     Distance 6389  feet     Walk Time 6 minutes     # of Rest Breaks 0     MPH 2.36     METS 3     RPE 9     Perceived Dyspnea  0     VO2 Peak 10.48     Symptoms No     Resting HR 43 bpm     Resting BP 128/78     Resting Oxygen Saturation  100 %     Exercise Oxygen Saturation  during 6 min walk 100 %     Max Ex. HR 56 bpm     Max Ex. BP 152/86     2 Minute Post BP 140/84              Oxygen Initial Assessment:   Oxygen Re-Evaluation:   Oxygen Discharge (Final Oxygen Re-Evaluation):   Initial Exercise Prescription:  Initial Exercise Prescription - 08/22/21 1400       Date of Initial Exercise RX and Referring Provider   Date 08/22/21    Referring Provider Candee Furbish, MD    Expected Discharge Date 10/20/21      Recumbant Bike   Level 2     RPM 60    Minutes 15    METs 2.3      Track   Laps 14    Minutes 15    METs 2.62      Prescription Details   Frequency (times per week) 3    Duration Progress to 30 minutes of continuous aerobic without signs/symptoms of physical distress      Intensity   THRR 40-80% of Max Heartrate 62-124    Ratings of Perceived Exertion 11-13    Perceived Dyspnea 0-4      Progression   Progression Continue progressive overload as per policy without signs/symptoms or physical distress.      Resistance Training   Training Prescription Yes    Weight 5    Reps 10-15             Perform Capillary Blood Glucose checks as needed.  Exercise Prescription Changes:   Exercise Comments:   Exercise Goals and Review:   Exercise Goals     Row Name 08/22/21 1446             Exercise Goals   Increase Physical Activity Yes       Intervention Provide advice, education, support and counseling about physical activity/exercise needs.;Develop an individualized exercise prescription for aerobic and resistive training based on initial evaluation findings, risk stratification, comorbidities and participant's personal goals.       Expected Outcomes Long Term: Exercising regularly at least 3-5 days a week.;Long Term: Add in home exercise to make exercise part of routine and to increase amount of physical activity.;Short Term: Attend rehab on a regular basis to increase amount of physical activity.       Increase Strength and Stamina Yes       Intervention Provide advice, education, support and counseling about physical activity/exercise needs.;Develop an individualized exercise prescription for aerobic and resistive training based on initial evaluation findings, risk stratification, comorbidities and participant's personal goals.       Expected Outcomes Short Term: Increase workloads from initial exercise prescription for resistance, speed, and METs.;Short Term: Perform resistance training exercises  routinely during rehab and add in resistance training at home;Long Term: Improve cardiorespiratory fitness, muscular endurance and strength as measured by increased METs and functional capacity (6MWT)  Able to understand and use rate of perceived exertion (RPE) scale Yes       Intervention Provide education and explanation on how to use RPE scale       Expected Outcomes Short Term: Able to use RPE daily in rehab to express subjective intensity level;Long Term:  Able to use RPE to guide intensity level when exercising independently       Knowledge and understanding of Target Heart Rate Range (THRR) Yes       Intervention Provide education and explanation of THRR including how the numbers were predicted and where they are located for reference       Expected Outcomes Short Term: Able to state/look up THRR;Short Term: Able to use daily as guideline for intensity in rehab;Long Term: Able to use THRR to govern intensity when exercising independently       Understanding of Exercise Prescription Yes       Intervention Provide education, explanation, and written materials on patient's individual exercise prescription       Expected Outcomes Short Term: Able to explain program exercise prescription;Long Term: Able to explain home exercise prescription to exercise independently                Exercise Goals Re-Evaluation :    Discharge Exercise Prescription (Final Exercise Prescription Changes):   Nutrition:  Target Goals: Understanding of nutrition guidelines, daily intake of sodium 1500mg , cholesterol 200mg , calories 30% from fat and 7% or less from saturated fats, daily to have 5 or more servings of fruits and vegetables.  Biometrics:  Pre Biometrics - 08/22/21 1315       Pre Biometrics   Waist Circumference 36 inches    Hip Circumference 40.5 inches    Waist to Hip Ratio 0.89 %    Triceps Skinfold 8 mm    % Body Fat 21.6 %    Grip Strength 56 kg    Flexibility 12.5 in     Single Leg Stand 30 seconds              Nutrition Therapy Plan and Nutrition Goals:   Nutrition Assessments:  MEDIFICTS Score Key: ?70 Need to make dietary changes  40-70 Heart Healthy Diet ? 40 Therapeutic Level Cholesterol Diet   Picture Your Plate Scores: <61 Unhealthy dietary pattern with much room for improvement. 41-50 Dietary pattern unlikely to meet recommendations for good health and room for improvement. 51-60 More healthful dietary pattern, with some room for improvement.  >60 Healthy dietary pattern, although there may be some specific behaviors that could be improved.    Nutrition Goals Re-Evaluation:   Nutrition Goals Discharge (Final Nutrition Goals Re-Evaluation):   Psychosocial: Target Goals: Acknowledge presence or absence of significant depression and/or stress, maximize coping skills, provide positive support system. Participant is able to verbalize types and ability to use techniques and skills needed for reducing stress and depression.  Initial Review & Psychosocial Screening:  Initial Psych Review & Screening - 08/22/21 1501       Initial Review   Current issues with None Identified      Family Dynamics   Good Support System? Yes   Fender has his wife and children for support     Barriers   Psychosocial barriers to participate in program There are no identifiable barriers or psychosocial needs.      Screening Interventions   Interventions Encouraged to exercise             Quality of Life Scores:  Quality  of Life - 08/22/21 1454       Quality of Life   Select Quality of Life      Quality of Life Scores   Health/Function Pre 24 %    Socioeconomic Pre 28.31 %    Psych/Spiritual Pre 28.29 %    Family Pre 28.8 %    GLOBAL Pre 26.53 %            Scores of 19 and below usually indicate a poorer quality of life in these areas.  A difference of  2-3 points is a clinically meaningful difference.  A difference of 2-3 points in  the total score of the Quality of Life Index has been associated with significant improvement in overall quality of life, self-image, physical symptoms, and general health in studies assessing change in quality of life.  PHQ-9: Recent Review Flowsheet Data     Depression screen [facility] 2/9 08/22/2021 08/22/2021 12/19/2015 07/21/2015 05/12/2015   Decreased Interest 0 0 0 0 0   Down, Depressed, Hopeless 0 0 0 0 0   PHQ - 2 Score 0 0 0 0 0      Interpretation of Total Score  Total Score Depression Severity:  1-4 = Minimal depression, 5-9 = Mild depression, 10-14 = Moderate depression, 15-19 = Moderately severe depression, 20-27 = Severe depression   Psychosocial Evaluation and Intervention:   Psychosocial Re-Evaluation:   Psychosocial Discharge (Final Psychosocial Re-Evaluation):   Vocational Rehabilitation: Provide vocational rehab assistance to qualifying candidates.   Vocational Rehab Evaluation & Intervention:  Vocational Rehab - 08/22/21 1501       Initial Vocational Rehab Evaluation & Intervention   Assessment shows need for Vocational Rehabilitation No   Clever works part time and does not need vocational rehab at this time.            Education: Education Goals: Education classes will be provided on a weekly basis, covering required topics. Participant will state understanding/return demonstration of topics presented.  Learning Barriers/Preferences:  Learning Barriers/Preferences - 08/22/21 1457       Learning Barriers/Preferences   Learning Barriers Sight   wears glasses   Learning Preferences Group Instruction;Individual Instruction;Pictoral;Computer/Internet;Video;Skilled Demonstration             Education Topics: Hypertension, Hypertension Reduction -Define heart disease and high blood pressure. Discus how high blood pressure affects the body and ways to reduce high blood pressure.   Exercise and Your Heart -Discuss why it is important to exercise,  the FITT principles of exercise, normal and abnormal responses to exercise, and how to exercise safely.   Angina -Discuss definition of angina, causes of angina, treatment of angina, and how to decrease risk of having angina.   Cardiac Medications -Review what the following cardiac medications are used for, how they affect the body, and side effects that may occur when taking the medications.  Medications include Aspirin, Beta blockers, calcium channel blockers, ACE Inhibitors, angiotensin receptor blockers, diuretics, digoxin, and antihyperlipidemics.   Congestive Heart Failure -Discuss the definition of CHF, how to live with CHF, the signs and symptoms of CHF, and how keep track of weight and sodium intake.   Heart Disease and Intimacy -Discus the effect sexual activity has on the heart, how changes occur during intimacy as we age, and safety during sexual activity.   Smoking Cessation / COPD -Discuss different methods to quit smoking, the health benefits of quitting smoking, and the definition of COPD.   Nutrition I: Fats -Discuss the types of cholesterol,  what cholesterol does to the heart, and how cholesterol levels can be controlled.   Nutrition II: Labels -Discuss the different components of food labels and how to read food label   Heart Parts/Heart Disease and PAD -Discuss the anatomy of the heart, the pathway of blood circulation through the heart, and these are affected by heart disease.   Stress I: Signs and Symptoms -Discuss the causes of stress, how stress may lead to anxiety and depression, and ways to limit stress.   Stress II: Relaxation -Discuss different types of relaxation techniques to limit stress.   Warning Signs of Stroke / TIA -Discuss definition of a stroke, what the signs and symptoms are of a stroke, and how to identify when someone is having stroke.   Knowledge Questionnaire Score:  Knowledge Questionnaire Score - 08/22/21 1456        Knowledge Questionnaire Score   Pre Score 17/24             Core Components/Risk Factors/Patient Goals at Admission:  Personal Goals and Risk Factors at Admission - 08/22/21 1456       Core Components/Risk Factors/Patient Goals on Admission    Weight Management Weight Maintenance    Hypertension Yes    Intervention Provide education on lifestyle modifcations including regular physical activity/exercise, weight management, moderate sodium restriction and increased consumption of fresh fruit, vegetables, and low fat dairy, alcohol moderation, and smoking cessation.;Monitor prescription use compliance.    Expected Outcomes Short Term: Continued assessment and intervention until BP is < 140/100mm HG in hypertensive participants. < 130/54mm HG in hypertensive participants with diabetes, heart failure or chronic kidney disease.;Long Term: Maintenance of blood pressure at goal levels.    Lipids Yes    Intervention Provide education and support for participant on nutrition & aerobic/resistive exercise along with prescribed medications to achieve LDL 70mg , HDL >40mg .    Expected Outcomes Short Term: Participant states understanding of desired cholesterol values and is compliant with medications prescribed. Participant is following exercise prescription and nutrition guidelines.;Long Term: Cholesterol controlled with medications as prescribed, with individualized exercise RX and with personalized nutrition plan. Value goals: LDL < 70mg , HDL > 40 mg.             Core Components/Risk Factors/Patient Goals Review:    Core Components/Risk Factors/Patient Goals at Discharge (Final Review):    ITP Comments:  ITP Comments     Row Name 08/22/21 1401           ITP Comments Dr Fransico Him MD, Medical Director                Comments: Coralyn Mark  attended orientation on 08/22/2021 to review rules and guidelines for program.  Completed 6 minute walk test, Intitial ITP, and exercise  prescription.  VSS. Telemetry-Sinus Brady rate 40's resting first degree heart block.  Asymptomatic.  Please see previous documentation in Epic 08/22/2021 3:48 PM Safety measures and social distancing in place per CDC guidelines. Barnet Pall, RN,BSN

## 2021-08-22 NOTE — Progress Notes (Signed)
6 minute walk test completed. Vital signs are as follows.  Pre walk test heart rate 43 Blood pressure 128/78 Max heart rate 56  post  6 minute walk test Blood pressure 152/86 Post 2 minute walk test heart rate 43 Sinus brady. Blood pressure 140/84 Only one PVC noted during 6 minute walk test.  [name] PAC called and notified of vital sings. Vin said Nicholas [name] is okay to proceed with exercise at cardiac rehab and will discuss with Dr Kingsley Plan if changing his medication therapy is needed. Nicholas Musselman was asymptomatic today.Will fax walk test  flow sheets and ECG tracings to Dr. Marlou Porch  office for review. Barnet Pall, RN,BSN 08/22/2021 3:15 PM

## 2021-08-22 NOTE — Progress Notes (Signed)
Patient here for cardiac rehab orientation. Resting heart rate 42-45. Telemetry rhythm Sinus brady first degree heart block. Patient asymptomatic. No PVC's noted currently. Oxygen saturation 100% on room air. Blood pressure 128/78. [name] PAC paged and notified. Vin said to proceed with 6 minute walk test will notify about heart rates afterwards.Barnet Pall, RN,BSN 08/22/2021 1:49 PM

## 2021-08-22 NOTE — Progress Notes (Signed)
Cardiac Rehab Medication Review by a Nurse  Does the patient  feel that his/her medications are working for him/her?  YES   Has the patient been experiencing any side effects to the medications prescribed?  NO  Does the patient measure his/her own blood pressure or blood glucose at home?  YES   Does the patient have any problems obtaining medications due to transportation or finances?    NO  Understanding of regimen: good Understanding of indications: good Potential of compliance: good    Nurse comments: Nicholas [name] is taking his medications and has a good understanding of what his medications are for. Nicholas [name] says that he checks his blood pressures at home and that he has noted his heart rate to be in the 40's at times.    Harrell Gave RN BSN 08/22/2021 2:55 PM

## 2021-08-24 ENCOUNTER — Other Ambulatory Visit (HOSPITAL_COMMUNITY): Payer: Self-pay

## 2021-08-24 ENCOUNTER — Telehealth: Payer: Self-pay

## 2021-08-24 MED ORDER — AMIODARONE HCL 100 MG PO TABS
100.0000 mg | ORAL_TABLET | Freq: Every day | ORAL | 3 refills | Status: DC
  Filled 2021-08-24: qty 90, 90d supply, fill #0
  Filled 2022-01-22: qty 60, 60d supply, fill #1
  Filled 2022-01-22: qty 30, 30d supply, fill #1
  Filled 2022-04-23: qty 90, 90d supply, fill #2
  Filled 2022-08-03: qty 90, 90d supply, fill #3

## 2021-08-24 MED ORDER — METOPROLOL TARTRATE 25 MG PO TABS
25.0000 mg | ORAL_TABLET | Freq: Two times a day (BID) | ORAL | 3 refills | Status: DC
  Filled 2021-08-24: qty 180, 90d supply, fill #0

## 2021-08-24 NOTE — Telephone Encounter (Signed)
  From: Jerline Pain, MD  Sent: 08/23/2021   7:10 AM EST  To: Leanor Kail, PA  Subject: RE: Sinus Loletha Grayer noted at cardiac rehab oriet*   Yes. Let's decrease amiodarone to 100mg  PO QD and metoprolol to 25 BID  See if he can follow up with you in about a month or so.   Thanks  Candee Furbish, MD    ----- Message -----  From: Leanor Kail, [place]  Sent: 08/22/2021   4:41 PM EST  To: Jerline Pain, MD  Subject: Melton Alar: Sinus Loletha Grayer noted at cardiac rehab oriet*   Okay to reduce metoprolol to 37.5mg  BID or even lower?   ----- Message -----  From: Magda Kiel, RN  Sent: 08/22/2021   3:39 PM EST  To: Jerline Pain, MD, Shellia Cleverly, RN, *  Subject: Sinus Loletha Grayer noted at cardiac rehab orietation   Good afternoon Dr Marlou Porch,    Mr Allender attended cardiac rehab orientation this afternoon. Resting heart rate 42-45. Telemetry rhythm Sinus brady first degree heart block. Mr Griesinger was asymptomatic. I called [name] PAC to notify and proceeded with his 6 minute walk test.   6 minute walk test completed. Vital signs are as follows.     Pre walk test heart rate 43 Blood pressure 128/78  Max heart rate 56  post  6 minute walk test Blood pressure 152/86  Post 2 minute walk test heart rate 43 Sinus brady. Blood pressure 140/84  Only one PVC noted during 6 minute walk test.   Patient is on 50 mg of metoprolol twice a day which was increased by Vin on 07/13/21 and 200 mg of Amiodarone once a day due to history of frequent PVC's. Vin said that he will discuss with you whether his medications need to be adjusted.  Mr Roesler will begin exercise on Monday 08/28/21.   I faxed the ECG tracings to the office for your review.   Thanks for your input!   Sincerely,  Barnet Pall RN  Cardiac Rehab

## 2021-08-24 NOTE — Telephone Encounter (Signed)
Called patient with medication changes. Patient will start taking  metoprolol 25 mg BID and amiodarone 100 mg daily.  Updated medication list. Patient still needs an appointment in one month, will forward to Dr. Marlou Porch nurse to see if she can get patient in to see Dr. Marlou Porch.

## 2021-08-28 ENCOUNTER — Telehealth: Payer: Self-pay | Admitting: Cardiology

## 2021-08-28 ENCOUNTER — Ambulatory Visit (HOSPITAL_COMMUNITY): Payer: 59

## 2021-08-28 DIAGNOSIS — R000 Tachycardia, unspecified: Secondary | ICD-10-CM

## 2021-08-28 NOTE — Telephone Encounter (Signed)
Pt returned CR phone call and stated he would like to move his class time to 3PM.

## 2021-08-28 NOTE — Telephone Encounter (Signed)
Pt c/o medication issue:  1. Name of Medication: amiodarone (PACERONE) 100 MG tablet  2. How are you currently taking this medication (dosage and times per day)? Take 1 tablet (100 mg total) by mouth daily.  3. Are you having a reaction (difficulty breathing--STAT)? No   4. What is your medication issue? Patient says that when he was taking 2 tablets by mouth daily, he was doing a lot better than he is now taking one 1 tablet

## 2021-08-28 NOTE — Telephone Encounter (Signed)
Returned pt phone call in regards to CR, LMTCB.  

## 2021-08-29 NOTE — Telephone Encounter (Signed)
Please see phone note from 11/21 for more documentation.

## 2021-08-29 NOTE — Telephone Encounter (Signed)
From: Jerline Pain, MD  Sent: 08/23/2021   7:10 AM EST  To: Leanor Kail, PA  Subject: RE: Sinus Loletha Grayer noted at cardiac rehab oriet*   Yes. Let's decrease amiodarone to 100mg  PO QD and metoprolol to 25 BID  See if he can follow up with you in about a month or so.   Thanks  Candee Furbish, MD   Attempted to contact pt to discuss his concerns of "doing a lot better than he is now taking one tablet"   Amiodarone was recently decreased from 200 mg daily (see above).  Called phone # listed.  Phone rang and sounded as if it were answered but no one said anything.  Unable to left message.  Will attempt to call back.

## 2021-08-29 NOTE — Telephone Encounter (Signed)
Please have him follow up with Dr. Quentin Ore (EP). Has seen him in the hospitial.  Candee Furbish, MD   Will forward to Dr Quentin Ore and nurse nurse

## 2021-08-29 NOTE — Telephone Encounter (Signed)
Pt called back to discuss his concerns r/t "feeling better" before medication changes.  He reports for the last 1 - 2 weeks his HR has been between 45-50 bpm and never above 50 bpm.  For the past several days has has felt more fluttering in his chest when he wakes in the AM.  He gets up at 3 am to be at work at 4 am.  He notices the fluttering for 1 to 2 hours.  He generally takes his medications at 7 am.  Recent medication changes implemented 11/18 AM (to decrease Amiodarone to 100 mg PO QD and metoprolol to 25 mg BID).  Today would be the beginning of day 4 since changes made.  Pt denies any other s/s.  Advised to continue to monitor HR and BP.  He has a BP monitor at home (arm cuff) but is unsure of when the batteries were changed last.  He will make sure these are fresh.  Pt aware I will forward this information to Dr Marlou Porch for review and call back with any further instructions.  He starts cardiac rehab on Monday 11/28.

## 2021-08-30 ENCOUNTER — Ambulatory Visit (HOSPITAL_COMMUNITY): Payer: 59

## 2021-08-30 ENCOUNTER — Ambulatory Visit (INDEPENDENT_AMBULATORY_CARE_PROVIDER_SITE_OTHER): Payer: 59

## 2021-08-30 DIAGNOSIS — R000 Tachycardia, unspecified: Secondary | ICD-10-CM

## 2021-08-30 NOTE — Progress Notes (Unsigned)
Enrolled patient for a 7 day Zio XT monitor to be mailed to patients home.  

## 2021-08-30 NOTE — Telephone Encounter (Signed)
Pt advised that Dr. Quentin Ore would like Pt to wear a 7 day heart monitor and follow up after.

## 2021-09-01 ENCOUNTER — Ambulatory Visit (HOSPITAL_COMMUNITY): Payer: 59

## 2021-09-04 ENCOUNTER — Encounter (HOSPITAL_COMMUNITY)
Admission: RE | Admit: 2021-09-04 | Discharge: 2021-09-04 | Disposition: A | Discharge status: Home / Self Care | Payer: 59 | Source: Ambulatory Visit | Attending: Cardiology | Admitting: Cardiology

## 2021-09-04 ENCOUNTER — Other Ambulatory Visit: Payer: Self-pay

## 2021-09-04 ENCOUNTER — Ambulatory Visit (HOSPITAL_COMMUNITY): Payer: 59

## 2021-09-04 DIAGNOSIS — I214 Non-ST elevation (NSTEMI) myocardial infarction: Secondary | ICD-10-CM | POA: Diagnosis not present

## 2021-09-04 NOTE — Progress Notes (Signed)
Daily Session Note  Patient Details  Name: Nicholas [name] MRN: [id number] Date of Birth: [birth year] Referring Provider:   Flowsheet Row CARDIAC REHAB PHASE II ORIENTATION from 08/22/2021 in [place]  Referring Provider Nicholas Furbish, MD       Encounter Date: 09/04/2021  Check In:  Session Check In - 09/04/21 1450       Check-In   Supervising physician immediately available to respond to emergencies [facility] immediately available    Physician(s) Dr. Broadus John    Location [facility]    Staff Present Barnet Pall, RN, Milus Glazier, MS, ACSM-CEP, CCRP, Exercise Physiologist;Jetta Gilford Rile BS, ACSM EP-C, Exercise Physiologist;Olinty Celesta Aver, MS, ACSM CEP, Exercise Physiologist    Virtual Visit No    Medication changes reported     Yes    Comments Amiodarone decreased to $RemoveBefo'100mg'JjTslkCxtGr$  once daily. Metoprolol decreased to $RemoveBefo'25mg'OwCETCgcPzH$  twice daily.    Fall or balance concerns reported    No    Tobacco Cessation No Change    Warm-up and Cool-down Performed on first and last piece of equipment    Resistance Training Performed Yes    VAD Patient? No    PAD/SET Patient? No      Pain Assessment   Currently in Pain? No/denies    Pain Score 0-No pain    Multiple Pain Sites No             Capillary Blood Glucose: No results found for this or any previous visit (from the past 24 hour(s)).   Exercise Prescription Changes - 09/04/21 1654       Response to Exercise   Blood Pressure (Admit) 124/80    Blood Pressure (Exercise) 160/86    Blood Pressure (Exit) 118/72    Heart Rate (Admit) 56 bpm    Heart Rate (Exercise) 75 bpm    Heart Rate (Exit) 50 bpm    Rating of Perceived Exertion (Exercise) 12    Perceived Dyspnea (Exercise) 0    Symptoms 0    Comments Pt first day in the CRP2 program    Duration Progress to 30 minutes of  aerobic without signs/symptoms of physical distress    Intensity THRR unchanged      Progression    Progression Continue to progress workloads to maintain intensity without signs/symptoms of physical distress.    Average METs 2.37      Resistance Training   Training Prescription Yes    Weight 5    Reps 10-15    Time 10 Minutes      Recumbant Bike   Level 2.2    RPM 60    Minutes 15    METs 2      Track   Laps 15    Minutes 15    METs 2.74             Social History   Tobacco Use  Smoking Status Former   Types: Cigarettes   Quit date: 06/10/2002   Years since quitting: 19.2  Smokeless Tobacco Never    Goals Met:  Exercise tolerated well No report of concerns or symptoms today Strength training completed today  Goals Unmet:  Not Applicable  Comments: Nicholas [name] started cardiac rehab today.  Pt tolerated light exercise without difficulty. VSS, telemetry-Sinus Rhythm, rare PVC, asymptomatic.  Medication list reconciled. Medication changes notedPt denies barriers to medicaiton compliance.  PSYCHOSOCIAL ASSESSMENT:  PHQ-0. Pt exhibits positive coping skills, hopeful outlook with supportive family. No psychosocial needs identified  at this time, no psychosocial interventions necessary.    Pt enjoys basketball and roller skating.   Pt oriented to exercise equipment and routine.    Understanding verbalized. Barnet Pall, RN,BSN 09/05/2021 8:05 AM    Dr. Fransico Him is Medical Director for Cardiac Rehab at [facility].

## 2021-09-05 NOTE — Progress Notes (Signed)
Cardiac Individual Treatment Plan  Patient Details  Name: Nicholas [name] MRN: [id number] Date of Birth: [birth year] Referring Provider:   Flowsheet Row CARDIAC REHAB PHASE II ORIENTATION from 08/22/2021 in [place]  Referring Provider Candee Furbish, MD       Initial Encounter Date:  [place] PHASE II ORIENTATION from 08/22/2021 in [place]  Date 08/22/21       Visit Diagnosis: 06/16/21 NSTEMI Medical treatment  Patient's Home Medications on Admission:  Current Outpatient Medications:    alfuzosin (UROXATRAL) 10 MG 24 hr tablet, Take 1 tablet (10 mg total) by mouth daily., Disp: 90 tablet, Rfl: 4   amiodarone (PACERONE) 100 MG tablet, Take 1 tablet (100 mg total) by mouth daily., Disp: 90 tablet, Rfl: 3   aspirin EC 81 MG tablet, Take 81 mg by mouth daily with breakfast., Disp: , Rfl:    metoprolol tartrate (LOPRESSOR) 25 MG tablet, Take 1 tablet (25 mg total) by mouth 2 (two) times daily., Disp: 180 tablet, Rfl: 3   nitroGLYCERIN (NITROSTAT) 0.4 MG SL tablet, Place 1 tablet (0.4 mg total) under the tongue every 5 (five) minutes x 3 doses as needed for chest pain., Disp: 25 tablet, Rfl: 12   rosuvastatin (CRESTOR) 40 MG tablet, Take 1 tablet (40 mg total) by mouth daily at 6 PM., Disp: 30 tablet, Rfl: 3  Past Medical History: Past Medical History:  Diagnosis Date   Arthritis    knees, hands   Cataract    Coronary artery disease    Dysrhythmia 06/2014   sinus brady with PVC'S   GERD (gastroesophageal reflux disease)    Hepatitis C    Hx of adenomatous colonic polyps 09/21/2014   Hypercholesteremia    Hypertension    Myocardial infarction ([place])     Tobacco Use: Social History   Tobacco Use  Smoking Status Former   Types: Cigarettes   Quit date: 06/10/2002   Years since quitting: 19.2  Smokeless Tobacco Never    Labs: Recent Review Flowsheet Data     Labs for ITP Cardiac and  Pulmonary Rehab Latest Ref Rng & Units 12/18/2009 06/10/2014 06/11/2014   Cholestrol 0 - 200 mg/dL - - 116   LDLCALC 0 - 99 mg/dL - - 47   HDL >39 mg/dL - - 57   Trlycerides <150 mg/dL - - 60   Hemoglobin A1c <5.7 % - 6.4(H) -   TCO2 0 - 100 mmol/L 26 - -       Capillary Blood Glucose: No results found for: GLUCAP   Exercise Target Goals: Exercise Program Goal: Individual exercise prescription set using results from initial 6 min walk test and THRR while considering  patient's activity barriers and safety.   Exercise Prescription Goal: Starting with aerobic activity 30 plus minutes a day, 3 days per week for initial exercise prescription. Provide home exercise prescription and guidelines that participant acknowledges understanding prior to discharge.  Activity Barriers & Risk Stratification:  Activity Barriers & Cardiac Risk Stratification - 08/22/21 1446       Activity Barriers & Cardiac Risk Stratification   Activity Barriers Deconditioning;Shortness of Breath    Cardiac Risk Stratification High             6 Minute Walk:  6 Minute Walk     Row Name 08/22/21 1353         6 Minute Walk   Phase Initial     Distance 6606  feet     Walk Time 6 minutes     # of Rest Breaks 0     MPH 2.36     METS 3     RPE 9     Perceived Dyspnea  0     VO2 Peak 10.48     Symptoms No     Resting HR 43 bpm     Resting BP 128/78     Resting Oxygen Saturation  100 %     Exercise Oxygen Saturation  during 6 min walk 100 %     Max Ex. HR 56 bpm     Max Ex. BP 152/86     2 Minute Post BP 140/84              Oxygen Initial Assessment:   Oxygen Re-Evaluation:   Oxygen Discharge (Final Oxygen Re-Evaluation):   Initial Exercise Prescription:  Initial Exercise Prescription - 08/22/21 1400       Date of Initial Exercise RX and Referring Provider   Date 08/22/21    Referring Provider Candee Furbish, MD    Expected Discharge Date 10/20/21      Recumbant Bike   Level 2     RPM 60    Minutes 15    METs 2.3      Track   Laps 14    Minutes 15    METs 2.62      Prescription Details   Frequency (times per week) 3    Duration Progress to 30 minutes of continuous aerobic without signs/symptoms of physical distress      Intensity   THRR 40-80% of Max Heartrate 62-124    Ratings of Perceived Exertion 11-13    Perceived Dyspnea 0-4      Progression   Progression Continue progressive overload as per policy without signs/symptoms or physical distress.      Resistance Training   Training Prescription Yes    Weight 5    Reps 10-15             Perform Capillary Blood Glucose checks as needed.  Exercise Prescription Changes:   Exercise Prescription Changes     Row Name 09/04/21 1654             Response to Exercise   Blood Pressure (Admit) 124/80       Blood Pressure (Exercise) 160/86       Blood Pressure (Exit) 118/72       Heart Rate (Admit) 56 bpm       Heart Rate (Exercise) 75 bpm       Heart Rate (Exit) 50 bpm       Rating of Perceived Exertion (Exercise) 12       Perceived Dyspnea (Exercise) 0       Symptoms 0       Comments Pt first day in the CRP2 program       Duration Progress to 30 minutes of  aerobic without signs/symptoms of physical distress       Intensity THRR unchanged         Progression   Progression Continue to progress workloads to maintain intensity without signs/symptoms of physical distress.       Average METs 2.37         Resistance Training   Training Prescription Yes       Weight 5       Reps 10-15       Time 10 Minutes  Recumbant Bike   Level 2.2       RPM 60       Minutes 15       METs 2         Track   Laps 15       Minutes 15       METs 2.74                Exercise Comments:   Exercise Comments     Row Name 09/04/21 1659           Exercise Comments Pt's first day in the [place] program. pt tolerated evercise well with an average MET level of 2.37. Pt understands his THRR,  RPE and Ex Rx                Exercise Goals and Review:   Exercise Goals     Row Name 08/22/21 1446             Exercise Goals   Increase Physical Activity Yes       Intervention Provide advice, education, support and counseling about physical activity/exercise needs.;Develop an individualized exercise prescription for aerobic and resistive training based on initial evaluation findings, risk stratification, comorbidities and participant's personal goals.       Expected Outcomes Long Term: Exercising regularly at least 3-5 days a week.;Long Term: Add in home exercise to make exercise part of routine and to increase amount of physical activity.;Short Term: Attend rehab on a regular basis to increase amount of physical activity.       Increase Strength and Stamina Yes       Intervention Provide advice, education, support and counseling about physical activity/exercise needs.;Develop an individualized exercise prescription for aerobic and resistive training based on initial evaluation findings, risk stratification, comorbidities and participant's personal goals.       Expected Outcomes Short Term: Increase workloads from initial exercise prescription for resistance, speed, and METs.;Short Term: Perform resistance training exercises routinely during rehab and add in resistance training at home;Long Term: Improve cardiorespiratory fitness, muscular endurance and strength as measured by increased METs and functional capacity (6MWT)       Able to understand and use rate of perceived exertion (RPE) scale Yes       Intervention Provide education and explanation on how to use RPE scale       Expected Outcomes Short Term: Able to use RPE daily in rehab to express subjective intensity level;Long Term:  Able to use RPE to guide intensity level when exercising independently       Knowledge and understanding of Target Heart Rate Range (THRR) Yes       Intervention Provide education and explanation of  THRR including how the numbers were predicted and where they are located for reference       Expected Outcomes Short Term: Able to state/look up THRR;Short Term: Able to use daily as guideline for intensity in rehab;Long Term: Able to use THRR to govern intensity when exercising independently       Understanding of Exercise Prescription Yes       Intervention Provide education, explanation, and written materials on patient's individual exercise prescription       Expected Outcomes Short Term: Able to explain program exercise prescription;Long Term: Able to explain home exercise prescription to exercise independently                Exercise Goals Re-Evaluation :  Exercise Goals Re-Evaluation     Row  Name 09/04/21 1657             Exercise Goal Re-Evaluation   Exercise Goals Review Increase Physical Activity;Increase Strength and Stamina;Able to understand and use rate of perceived exertion (RPE) scale;Knowledge and understanding of Target Heart Rate Range (THRR);Understanding of Exercise Prescription       Comments Pt's first day in the CRP2 program. pt tolerated evercise well with an average MET level of 2.37. Pt understands his THRR, RPE and Ex Rx       Expected Outcomes Will continue to monitor pt and progress workloads as tolerated without sign or symptom                 Discharge Exercise Prescription (Final Exercise Prescription Changes):  Exercise Prescription Changes - 09/04/21 1654       Response to Exercise   Blood Pressure (Admit) 124/80    Blood Pressure (Exercise) 160/86    Blood Pressure (Exit) 118/72    Heart Rate (Admit) 56 bpm    Heart Rate (Exercise) 75 bpm    Heart Rate (Exit) 50 bpm    Rating of Perceived Exertion (Exercise) 12    Perceived Dyspnea (Exercise) 0    Symptoms 0    Comments Pt first day in the CRP2 program    Duration Progress to 30 minutes of  aerobic without signs/symptoms of physical distress    Intensity THRR unchanged       Progression   Progression Continue to progress workloads to maintain intensity without signs/symptoms of physical distress.    Average METs 2.37      Resistance Training   Training Prescription Yes    Weight 5    Reps 10-15    Time 10 Minutes      Recumbant Bike   Level 2.2    RPM 60    Minutes 15    METs 2      Track   Laps 15    Minutes 15    METs 2.74             Nutrition:  Target Goals: Understanding of nutrition guidelines, daily intake of sodium '1500mg'$ , cholesterol '200mg'$ , calories 30% from fat and 7% or less from saturated fats, daily to have 5 or more servings of fruits and vegetables.  Biometrics:  Pre Biometrics - 08/22/21 1315       Pre Biometrics   Waist Circumference 36 inches    Hip Circumference 40.5 inches    Waist to Hip Ratio 0.89 %    Triceps Skinfold 8 mm    % Body Fat 21.6 %    Grip Strength 56 kg    Flexibility 12.5 in    Single Leg Stand 30 seconds              Nutrition Therapy Plan and Nutrition Goals:   Nutrition Assessments:  MEDIFICTS Score Key: ?70 Need to make dietary changes  40-70 Heart Healthy Diet ? 40 Therapeutic Level Cholesterol Diet   Picture Your Plate Scores: <54 Unhealthy dietary pattern with much room for improvement. 41-50 Dietary pattern unlikely to meet recommendations for good health and room for improvement. 51-60 More healthful dietary pattern, with some room for improvement.  >60 Healthy dietary pattern, although there may be some specific behaviors that could be improved.    Nutrition Goals Re-Evaluation:   Nutrition Goals Discharge (Final Nutrition Goals Re-Evaluation):   Psychosocial: Target Goals: Acknowledge presence or absence of significant depression and/or stress, maximize coping skills, provide positive  support system. Participant is able to verbalize types and ability to use techniques and skills needed for reducing stress and depression.  Initial Review & Psychosocial  Screening:  Initial Psych Review & Screening - 08/22/21 1501       Initial Review   Current issues with None Identified      Family Dynamics   Good Support System? Yes   Cormac has his wife and children for support     Barriers   Psychosocial barriers to participate in program There are no identifiable barriers or psychosocial needs.      Screening Interventions   Interventions Encouraged to exercise             Quality of Life Scores:  Quality of Life - 08/22/21 1454       Quality of Life   Select Quality of Life      Quality of Life Scores   Health/Function Pre 24 %    Socioeconomic Pre 28.31 %    Psych/Spiritual Pre 28.29 %    Family Pre 28.8 %    GLOBAL Pre 26.53 %            Scores of 19 and below usually indicate a poorer quality of life in these areas.  A difference of  2-3 points is a clinically meaningful difference.  A difference of 2-3 points in the total score of the Quality of Life Index has been associated with significant improvement in overall quality of life, self-image, physical symptoms, and general health in studies assessing change in quality of life.  PHQ-9: Recent Review Flowsheet Data     Depression screen [facility] 2/9 08/22/2021 08/22/2021 12/19/2015 07/21/2015 05/12/2015   Decreased Interest 0 0 0 0 0   Down, Depressed, Hopeless 0 0 0 0 0   PHQ - 2 Score 0 0 0 0 0      Interpretation of Total Score  Total Score Depression Severity:  1-4 = Minimal depression, 5-9 = Mild depression, 10-14 = Moderate depression, 15-19 = Moderately severe depression, 20-27 = Severe depression   Psychosocial Evaluation and Intervention:   Psychosocial Re-Evaluation:  Psychosocial Re-Evaluation     Row Name 09/05/21 0808             Psychosocial Re-Evaluation   Current issues with None Identified       Interventions Encouraged to attend Cardiac Rehabilitation for the exercise       Continue Psychosocial Services  No Follow up required                 Psychosocial Discharge (Final Psychosocial Re-Evaluation):  Psychosocial Re-Evaluation - 09/05/21 0808       Psychosocial Re-Evaluation   Current issues with None Identified    Interventions Encouraged to attend Cardiac Rehabilitation for the exercise    Continue Psychosocial Services  No Follow up required             Vocational Rehabilitation: Provide vocational rehab assistance to qualifying candidates.   Vocational Rehab Evaluation & Intervention:  Vocational Rehab - 08/22/21 1501       Initial Vocational Rehab Evaluation & Intervention   Assessment shows need for Vocational Rehabilitation No   Panagiotis works part time and does not need vocational rehab at this time.            Education: Education Goals: Education classes will be provided on a weekly basis, covering required topics. Participant will state understanding/return demonstration of topics presented.  Learning Barriers/Preferences:  Learning Barriers/Preferences -  08/22/21 1457       Learning Barriers/Preferences   Learning Barriers Sight   wears glasses   Learning Preferences Group Instruction;Individual Instruction;Pictoral;Computer/Internet;Video;Skilled Demonstration             Education Topics: Hypertension, Hypertension Reduction -Define heart disease and high blood pressure. Discus how high blood pressure affects the body and ways to reduce high blood pressure.   Exercise and Your Heart -Discuss why it is important to exercise, the FITT principles of exercise, normal and abnormal responses to exercise, and how to exercise safely.   Angina -Discuss definition of angina, causes of angina, treatment of angina, and how to decrease risk of having angina.   Cardiac Medications -Review what the following cardiac medications are used for, how they affect the body, and side effects that may occur when taking the medications.  Medications include Aspirin, Beta blockers, calcium channel  blockers, ACE Inhibitors, angiotensin receptor blockers, diuretics, digoxin, and antihyperlipidemics.   Congestive Heart Failure -Discuss the definition of CHF, how to live with CHF, the signs and symptoms of CHF, and how keep track of weight and sodium intake.   Heart Disease and Intimacy -Discus the effect sexual activity has on the heart, how changes occur during intimacy as we age, and safety during sexual activity.   Smoking Cessation / COPD -Discuss different methods to quit smoking, the health benefits of quitting smoking, and the definition of COPD.   Nutrition I: Fats -Discuss the types of cholesterol, what cholesterol does to the heart, and how cholesterol levels can be controlled.   Nutrition II: Labels -Discuss the different components of food labels and how to read food label   Heart Parts/Heart Disease and PAD -Discuss the anatomy of the heart, the pathway of blood circulation through the heart, and these are affected by heart disease.   Stress I: Signs and Symptoms -Discuss the causes of stress, how stress may lead to anxiety and depression, and ways to limit stress.   Stress II: Relaxation -Discuss different types of relaxation techniques to limit stress.   Warning Signs of Stroke / TIA -Discuss definition of a stroke, what the signs and symptoms are of a stroke, and how to identify when someone is having stroke.   Knowledge Questionnaire Score:  Knowledge Questionnaire Score - 08/22/21 1456       Knowledge Questionnaire Score   Pre Score 17/24             Core Components/Risk Factors/Patient Goals at Admission:  Personal Goals and Risk Factors at Admission - 08/22/21 1456       Core Components/Risk Factors/Patient Goals on Admission    Weight Management Weight Maintenance    Hypertension Yes    Intervention Provide education on lifestyle modifcations including regular physical activity/exercise, weight management, moderate sodium restriction  and increased consumption of fresh fruit, vegetables, and low fat dairy, alcohol moderation, and smoking cessation.;Monitor prescription use compliance.    Expected Outcomes Short Term: Continued assessment and intervention until BP is < 140/73mm HG in hypertensive participants. < 130/60mm HG in hypertensive participants with diabetes, heart failure or chronic kidney disease.;Long Term: Maintenance of blood pressure at goal levels.    Lipids Yes    Intervention Provide education and support for participant on nutrition & aerobic/resistive exercise along with prescribed medications to achieve LDL '70mg'$ , HDL >$Remo'40mg'fXwKT$ .    Expected Outcomes Short Term: Participant states understanding of desired cholesterol values and is compliant with medications prescribed. Participant is following exercise prescription and nutrition guidelines.;Long  Term: Cholesterol controlled with medications as prescribed, with individualized exercise RX and with personalized nutrition plan. Value goals: LDL < $Rem'70mg'iTJy$ , HDL > 40 mg.             Core Components/Risk Factors/Patient Goals Review:   Goals and Risk Factor Review     Row Name 09/05/21 0809             Core Components/Risk Factors/Patient Goals Review   Personal Goals Review Weight Management/Obesity;Hypertension;Lipids       Review Wendell started exercise at cardiac rehab on 09/04/21.Marland Kitchen Stanislav did well with exercise. Vital signs were stable.       Expected Outcomes Nisaiah will continue to participate in phase 2 cardiac rehab for exercise, nutrition and lifestyle modifications                Core Components/Risk Factors/Patient Goals at Discharge (Final Review):   Goals and Risk Factor Review - 09/05/21 0809       Core Components/Risk Factors/Patient Goals Review   Personal Goals Review Weight Management/Obesity;Hypertension;Lipids    Review Viktor started exercise at cardiac rehab on 09/04/21.Marland Kitchen Misael did well with exercise. Vital signs were stable.     Expected Outcomes Noam will continue to participate in phase 2 cardiac rehab for exercise, nutrition and lifestyle modifications             ITP Comments:  ITP Comments     Row Name 08/22/21 1401 09/05/21 0806         ITP Comments Dr Fransico Him MD, Medical Director 30 Day ITP Review. Devaughn started  cardiac rehab on 09/05/21 and did well with exercise.               Comments: See ITP comments.Barnet Pall, RN,BSN 09/05/2021 8:13 AM

## 2021-09-06 ENCOUNTER — Ambulatory Visit (HOSPITAL_COMMUNITY): Payer: 59

## 2021-09-06 ENCOUNTER — Other Ambulatory Visit: Payer: Self-pay

## 2021-09-06 ENCOUNTER — Encounter (HOSPITAL_COMMUNITY)
Admission: RE | Admit: 2021-09-06 | Discharge: 2021-09-06 | Disposition: A | Discharge status: Home / Self Care | Payer: 59 | Source: Ambulatory Visit | Attending: Cardiology | Admitting: Cardiology

## 2021-09-06 DIAGNOSIS — I214 Non-ST elevation (NSTEMI) myocardial infarction: Secondary | ICD-10-CM | POA: Diagnosis not present

## 2021-09-08 ENCOUNTER — Encounter (HOSPITAL_COMMUNITY): Payer: 59

## 2021-09-08 ENCOUNTER — Ambulatory Visit (HOSPITAL_COMMUNITY): Payer: 59

## 2021-09-09 DIAGNOSIS — R000 Tachycardia, unspecified: Secondary | ICD-10-CM | POA: Diagnosis not present

## 2021-09-11 ENCOUNTER — Ambulatory Visit (HOSPITAL_COMMUNITY): Payer: 59

## 2021-09-11 ENCOUNTER — Other Ambulatory Visit: Payer: Self-pay

## 2021-09-11 ENCOUNTER — Encounter (HOSPITAL_COMMUNITY)
Admission: RE | Admit: 2021-09-11 | Discharge: 2021-09-11 | Disposition: A | Discharge status: Home / Self Care | Payer: 59 | Source: Ambulatory Visit | Attending: Cardiology | Admitting: Cardiology

## 2021-09-11 DIAGNOSIS — I214 Non-ST elevation (NSTEMI) myocardial infarction: Secondary | ICD-10-CM | POA: Insufficient documentation

## 2021-09-13 ENCOUNTER — Encounter (HOSPITAL_COMMUNITY)
Admission: RE | Admit: 2021-09-13 | Discharge: 2021-09-13 | Disposition: A | Discharge status: Home / Self Care | Payer: 59 | Source: Ambulatory Visit | Attending: Cardiology | Admitting: Cardiology

## 2021-09-13 ENCOUNTER — Ambulatory Visit (HOSPITAL_COMMUNITY): Payer: 59

## 2021-09-13 ENCOUNTER — Other Ambulatory Visit: Payer: Self-pay

## 2021-09-13 DIAGNOSIS — I214 Non-ST elevation (NSTEMI) myocardial infarction: Secondary | ICD-10-CM

## 2021-09-15 ENCOUNTER — Telehealth (HOSPITAL_COMMUNITY): Payer: Self-pay | Admitting: Family Medicine

## 2021-09-15 ENCOUNTER — Encounter (HOSPITAL_COMMUNITY): Payer: 59

## 2021-09-15 ENCOUNTER — Ambulatory Visit (HOSPITAL_COMMUNITY): Payer: 59

## 2021-09-18 ENCOUNTER — Ambulatory Visit (HOSPITAL_COMMUNITY): Payer: 59

## 2021-09-18 ENCOUNTER — Encounter (HOSPITAL_COMMUNITY): Payer: 59

## 2021-09-18 ENCOUNTER — Telehealth (HOSPITAL_COMMUNITY): Payer: Self-pay | Admitting: Family Medicine

## 2021-09-19 ENCOUNTER — Telehealth: Payer: Self-pay | Admitting: Cardiology

## 2021-09-19 ENCOUNTER — Emergency Department (HOSPITAL_COMMUNITY)
Admission: EM | Admit: 2021-09-19 | Discharge: 2021-09-19 | Disposition: A | Discharge status: Home / Self Care | Payer: 59 | Attending: Emergency Medicine | Admitting: Emergency Medicine

## 2021-09-19 ENCOUNTER — Other Ambulatory Visit: Payer: Self-pay

## 2021-09-19 DIAGNOSIS — Z951 Presence of aortocoronary bypass graft: Secondary | ICD-10-CM | POA: Diagnosis not present

## 2021-09-19 DIAGNOSIS — Z7982 Long term (current) use of aspirin: Secondary | ICD-10-CM | POA: Insufficient documentation

## 2021-09-19 DIAGNOSIS — Z87891 Personal history of nicotine dependence: Secondary | ICD-10-CM | POA: Insufficient documentation

## 2021-09-19 DIAGNOSIS — I2510 Atherosclerotic heart disease of native coronary artery without angina pectoris: Secondary | ICD-10-CM | POA: Diagnosis not present

## 2021-09-19 DIAGNOSIS — Z79899 Other long term (current) drug therapy: Secondary | ICD-10-CM | POA: Insufficient documentation

## 2021-09-19 DIAGNOSIS — I10 Essential (primary) hypertension: Secondary | ICD-10-CM | POA: Diagnosis not present

## 2021-09-19 DIAGNOSIS — R002 Palpitations: Secondary | ICD-10-CM | POA: Insufficient documentation

## 2021-09-19 LAB — CBC WITH DIFFERENTIAL/PLATELET
Abs Immature Granulocytes: 0.01 10*3/uL (ref 0.00–0.07)
Basophils Absolute: 0 10*3/uL (ref 0.0–0.1)
Basophils Relative: 1 %
Eosinophils Absolute: 0.1 10*3/uL (ref 0.0–0.5)
Eosinophils Relative: 2 %
HCT: 42.3 % (ref 39.0–52.0)
Hemoglobin: 14.2 g/dL (ref 13.0–17.0)
Immature Granulocytes: 0 %
Lymphocytes Relative: 35 %
Lymphs Abs: 2.2 10*3/uL (ref 0.7–4.0)
MCH: 27.8 pg (ref 26.0–34.0)
MCHC: 33.6 g/dL (ref 30.0–36.0)
MCV: 82.9 fL (ref 80.0–100.0)
Monocytes Absolute: 0.7 10*3/uL (ref 0.1–1.0)
Monocytes Relative: 12 %
Neutro Abs: 3.2 10*3/uL (ref 1.7–7.7)
Neutrophils Relative %: 50 %
Platelets: 184 10*3/uL (ref 150–400)
RBC: 5.1 MIL/uL (ref 4.22–5.81)
RDW: 13.8 % (ref 11.5–15.5)
WBC: 6.4 10*3/uL (ref 4.0–10.5)
nRBC: 0 % (ref 0.0–0.2)

## 2021-09-19 LAB — BASIC METABOLIC PANEL
Anion gap: 9 (ref 5–15)
BUN: 18 mg/dL (ref 8–23)
CO2: 21 mmol/L — ABNORMAL LOW (ref 22–32)
Calcium: 9.3 mg/dL (ref 8.9–10.3)
Chloride: 106 mmol/L (ref 98–111)
Creatinine, Ser: 1.4 mg/dL — ABNORMAL HIGH (ref 0.61–1.24)
GFR, Estimated: 56 mL/min — ABNORMAL LOW (ref >60)
Glucose, Bld: 112 mg/dL — ABNORMAL HIGH (ref 70–99)
Potassium: 4.3 mmol/L (ref 3.5–5.1)
Sodium: 136 mmol/L (ref 135–145)

## 2021-09-19 LAB — TSH: TSH: 1.325 u[IU]/mL (ref 0.350–4.500)

## 2021-09-19 LAB — PHOSPHORUS: Phosphorus: 3 mg/dL (ref 2.5–4.6)

## 2021-09-19 LAB — MAGNESIUM: Magnesium: 2 mg/dL (ref 1.7–2.4)

## 2021-09-19 LAB — TROPONIN I (HIGH SENSITIVITY)
Troponin I (High Sensitivity): 13 ng/L (ref <18)
Troponin I (High Sensitivity): 14 ng/L (ref <18)

## 2021-09-19 NOTE — ED Notes (Signed)
RN reviewed discharge instructions w/ pt. Follow up reviewed, pt had no further questions 

## 2021-09-19 NOTE — Discharge Instructions (Signed)
You were seen in the ER today for your palpitations.  Your blood work, EKG, and chest x-ray were reassuring.  Her case was discussed with the cardiologist who recommends you continue your outpatient amiodarone and metoprolol and follow-up closely with Dr. Marlou Porch.  They should be contacting you soon about the results of from your recent heart monitor.  Return to the ER if develop any chest pain, difficulty breathing, worsening palpitations, nausea or vomiting that does not stop, or any other new severe symptom.

## 2021-09-19 NOTE — Telephone Encounter (Signed)
Patient's wife calling to inform that patient is currently in the hospital for irregular heartbeat. She states this morning it was skipping and fluttering. She says he did just turn in his heart monitor Saturday.

## 2021-09-19 NOTE — ED Provider Notes (Signed)
[facility] EMERGENCY DEPARTMENT Provider Note   CSN: [id number] Arrival date & time: 09/19/21  0930     History Chief Complaint  Patient presents with   Palpitations    Nicholas [name] is a 65 y.o. male  history of palpitations and NSTEMI in September of this year who presents with concern for palpitations morning with irregular heart beat according to the patient.  He states that he gets these frequently but they usually only last 5 minutes and this morning he lasted for about 30.  He did recently wear a ZIO cardiac monitor for 2 weeks but has not yet received the results.  Unfortunately his results are not visible in epic yet either.  He follows with [facility].  He is not anticoagulated, only on aspirin without platelet-blocker or DOAC/warfarin.  History of frequent PVCs and sinus bradycardia, history of chronic hep C and liver fibrosis.  Patient on Pacerone and metoprolol.  Asymptomatic at time of my evaluation.  HPI     Past Medical History:  Diagnosis Date   Arthritis    knees, hands   Cataract    Coronary artery disease    Dysrhythmia 06/2014   sinus brady with PVC'S   GERD (gastroesophageal reflux disease)    Hepatitis C    Hx of adenomatous colonic polyps 09/21/2014   Hypercholesteremia    Hypertension    Myocardial infarction [facility])     Patient Active Problem List   Diagnosis Date Noted   NSVT (nonsustained ventricular tachycardia) 06/18/2021   NSTEMI (non-ST elevated myocardial infarction) ([place]) 06/16/2021   Angina at rest ([place]) 06/16/2021   Liver fibrosis 12/19/2015   Chronic hepatitis C without hepatic coma ([place]) 04/13/2015   Hx of adenomatous colonic polyps 09/21/2014   CAD- CABG x 3 in [place] '03 05/21/2014   Dizziness when bending over 05/21/2014   Frequent PVCs 05/21/2014   Sinus bradycardia 05/21/2014   Chronic renal insufficiency, stage II (mild) 05/21/2014   Dyslipidemia 05/21/2014   Near syncope 05/21/2014    Past  Surgical History:  Procedure Laterality Date   CARDIAC CATHETERIZATION     CARDIAC SURGERY     triple bypass   COLONOSCOPY     CORONARY ARTERY BYPASS GRAFT     CABG x 3 Vessels   LEFT HEART CATH AND CORS/GRAFTS ANGIOGRAPHY N/A 06/19/2021   Procedure: LEFT HEART CATH AND CORS/GRAFTS ANGIOGRAPHY;  Surgeon: Belva Crome, MD;  Location: [facility];  Service: Cardiovascular;  Laterality: N/A;       Family History  Problem Relation Age of Onset   Diabetic kidney disease Mother    Kidney disease Mother    Throat cancer Mother    Cancer Mother    Diabetes Mother    Hypertension Mother    Esophageal cancer Mother    Other Father        unknown   Hypertension Father    Heart attack Neg Hx    Colon cancer Neg Hx    Stomach cancer Neg Hx    Rectal cancer Neg Hx    Colon polyps Neg Hx     Social History   Tobacco Use   Smoking status: Former    Types: Cigarettes    Quit date: 06/10/2002    Years since quitting: 19.2   Smokeless tobacco: Never  Vaping Use   Vaping Use: Never used  Substance Use Topics   Alcohol use: Yes    Alcohol/week: 7.0 standard drinks  Types: 7 Cans of beer per week   Drug use: Yes    Frequency: 3.0 times per week    Types: Marijuana    Comment: occ    Home Medications Prior to Admission medications   Medication Sig Start Date End Date Taking? Authorizing Provider  alfuzosin (UROXATRAL) 10 MG 24 hr tablet Take 1 tablet (10 mg total) by mouth daily. Patient taking differently: Take 10 mg by mouth every evening. 05/17/21  Yes   amiodarone (PACERONE) 100 MG tablet Take 1 tablet (100 mg total) by mouth daily. 08/24/21  Yes Jerline Pain, MD  aspirin EC 81 MG tablet Take 81 mg by mouth daily with breakfast.   Yes [provider]  docusate sodium (COLACE) 100 MG capsule Take 100 mg by mouth every evening.   Yes [provider]  metoprolol tartrate (LOPRESSOR) 25 MG tablet Take 1 tablet (25 mg total) by mouth 2 (two) times  daily. 08/24/21  Yes Jerline Pain, MD  nitroGLYCERIN (NITROSTAT) 0.4 MG SL tablet Place 1 tablet (0.4 mg total) under the tongue every 5 (five) minutes x 3 doses as needed for chest pain. 06/27/21  Yes Jerline Pain, MD  rosuvastatin (CRESTOR) 40 MG tablet Take 1 tablet (40 mg total) by mouth daily at 6 PM. 06/21/21  Yes Danford, Suann Larry, MD    Allergies    Patient has no known allergies.  Review of Systems   Review of Systems  Constitutional: Negative.   HENT: Negative.    Respiratory: Negative.    Cardiovascular:  Positive for palpitations. Negative for chest pain and leg swelling.  Gastrointestinal: Negative.   Genitourinary: Negative.   Musculoskeletal: Negative.   Skin: Negative.   Neurological: Negative.   Hematological: Negative.   Psychiatric/Behavioral: Negative.     Physical Exam Updated Vital Signs BP 138/84    Pulse (!) 45    Temp 98.5 F (36.9 C) (Oral)    Resp 15    SpO2 99%   Physical Exam Vitals and nursing note reviewed.  Constitutional:      Appearance: He is not ill-appearing or toxic-appearing.  HENT:     Head: Normocephalic and atraumatic.     Nose: Nose normal. No congestion.     Mouth/Throat:     Mouth: Mucous membranes are moist.     Pharynx: No oropharyngeal exudate or posterior oropharyngeal erythema.  Eyes:     General:        Right eye: No discharge.        Left eye: No discharge.     Extraocular Movements: Extraocular movements intact.     Conjunctiva/sclera: Conjunctivae normal.     Pupils: Pupils are equal, round, and reactive to light.  Cardiovascular:     Rate and Rhythm: Normal rate and regular rhythm.     Pulses: Normal pulses.          Dorsalis pedis pulses are 2+ on the right side and 2+ on the left side.     Heart sounds: Normal heart sounds.     Comments: Occasional PVCs on auscultation.  Pulmonary:     Effort: Pulmonary effort is normal. No tachypnea, bradypnea, accessory muscle usage, prolonged expiration or  respiratory distress.     Breath sounds: Normal breath sounds. No wheezing or rales.  Chest:     Chest wall: No mass, lacerations, deformity, swelling, tenderness, crepitus or edema.  Abdominal:     General: Bowel sounds are normal. There is no distension.  Palpations: Abdomen is soft.     Tenderness: There is no abdominal tenderness. There is no right CVA tenderness, left CVA tenderness, guarding or rebound.  Musculoskeletal:        General: No deformity.     Cervical back: Neck supple. No tenderness.     Right lower leg: No edema.     Left lower leg: No edema.  Lymphadenopathy:     Cervical: No cervical adenopathy.  Skin:    General: Skin is warm and dry.     Capillary Refill: Capillary refill takes less than 2 seconds.  Neurological:     General: No focal deficit present.     Mental Status: He is alert and oriented to person, place, and time. Mental status is at baseline.     Gait: Gait is intact.  Psychiatric:        Mood and Affect: Mood normal.    ED Results / Procedures / Treatments   Labs (all labs ordered are listed, but only abnormal results are displayed) Labs Reviewed  BASIC METABOLIC PANEL - Abnormal; Notable for the following components:      Result Value   CO2 21 (*)    Glucose, Bld 112 (*)    Creatinine, Ser 1.40 (*)    GFR, Estimated 56 (*)    All other components within normal limits  CBC WITH DIFFERENTIAL/PLATELET  TSH  MAGNESIUM  PHOSPHORUS  TROPONIN I (HIGH SENSITIVITY)  TROPONIN I (HIGH SENSITIVITY)    EKG EKG Interpretation  Date/Time:  Tuesday September 19 2021 09:36:10 EST Ventricular Rate:  77 PR Interval:  182 QRS Duration: 108 QT Interval:  418 QTC Calculation: 473 R Axis:   40 Text Interpretation: Sinus rhythm with frequent Premature ventricular complexes Inferior infarct , age undetermined T wave abnormality, consider lateral ischemia Abnormal ECG Similar to prior tracing Confirmed by Wynona Dove ([id number]) on 09/19/2021 11:07:47  AM  Radiology No results found.  Procedures Procedures   Medications Ordered in ED Medications - No data to display  ED Course  I have reviewed the triage vital signs and the nursing notes.  Pertinent labs & imaging results that were available during my care of the patient were reviewed by me and considered in my medical decision making (see chart for details).  Clinical Course as of 09/19/21 1340  Tue Sep 19, 2021  1210 Consult to [facility] attending, Dr. Johnsie Cancel, who recommends keeping patient's medications as they are and continued outpatient follows with Dr. Marlou Porch.  No acute changes necessary at this time per cardiology.  Appreciate his collaboration in the care of this patient. [RS]    Clinical Course User Index [RS] Caileigh Canche, Sharlene Dory   MDM Rules/Calculators/A&P                         65 year old male who presents with concern for palpitations.  On amiodarone and metoprolol.  Hypertensive on intake, vital signs otherwise normal.  Cardiopulmonary exam revealed intermittent PVCs but was otherwise unremarkable.  Abdominal exam is benign.  Patient is neurovascularly intact in all 4 extremities.  Labs were obtained from triage.  CBC unremarkable, BMP with creatinine of 1.4 at patient's baseline.  Electrolytes are normal.  TSH is normal, troponin is normal at 13.  Delta troponin pending.  Given cardiac history will discuss with cardiology for disposition planning. Patient asymptomatic with normal HR at this time, no acute intervention warranted. No electrolyte or thyroid derangement.  Consult to  cardiology as above. Second troponin normal, 14. No further work up warranted in the ED. Coralyn Mark voiced understanding with medical evaluation and treatment plan.  Each of his questions was answered to his expressed satisfaction.  Return precautions were given.  Patient is well-appearing, stable, and appropriate for discharge at this time.  This chart was dictated using voice recognition  software, Dragon. Despite the best efforts of this provider to proofread and correct errors, errors may still occur which can change documentation meaning.   Final Clinical Impression(s) / ED Diagnoses Final diagnoses:  Palpitations    Rx / DC Orders ED Discharge Orders     None        Emeline Darling, PA-C 09/19/21 [place], DO 09/19/21 1548

## 2021-09-19 NOTE — ED Triage Notes (Signed)
Pt. Stated, My heart has been skipping for 3-4 days a  different times

## 2021-09-19 NOTE — ED Provider Notes (Signed)
Emergency Medicine Provider Triage Evaluation Note  Nicholas [name] , a 65 y.o. male  was evaluated in triage.  Pt complains of palpitations for three days.  He feels like his heart is skipping beats. This occurs everyday at random times during the day. He states that he has a recent heart attack three weeks ago and was seen at [facility]. Denies chest pain, n/v, dyspnea.   Review of Systems  Positive: palpitations Negative: See above  Physical Exam  There were no vitals taken for this visit. Gen:   Awake, no distress   Resp:  Normal effort  MSK:   Moves extremities without difficulty  Other:  Heart S1S2, regular rate and rhythm. No murmurs. Pulses normal  Medical Decision Making  Medically screening exam initiated at 9:33 AM.  Appropriate orders placed.  [name] was informed that the remainder of the evaluation will be completed by another provider, this initial triage assessment does not replace that evaluation, and the importance of remaining in the ED until their evaluation is complete.     Adolphus Birchwood, PA-C 09/19/21 [id number]    Daleen Bo, MD 09/19/21 1106

## 2021-09-19 NOTE — Telephone Encounter (Signed)
Noted.  Will forward to Dr Marlou Porch for his knowledge.

## 2021-09-20 ENCOUNTER — Encounter (HOSPITAL_COMMUNITY)
Admission: RE | Admit: 2021-09-20 | Discharge: 2021-09-20 | Disposition: A | Discharge status: Home / Self Care | Payer: 59 | Source: Ambulatory Visit | Attending: Cardiology | Admitting: Cardiology

## 2021-09-20 ENCOUNTER — Ambulatory Visit (HOSPITAL_COMMUNITY): Payer: 59

## 2021-09-20 DIAGNOSIS — I214 Non-ST elevation (NSTEMI) myocardial infarction: Secondary | ICD-10-CM

## 2021-09-21 ENCOUNTER — Other Ambulatory Visit (HOSPITAL_COMMUNITY): Payer: Self-pay

## 2021-09-21 DIAGNOSIS — R000 Tachycardia, unspecified: Secondary | ICD-10-CM | POA: Diagnosis not present

## 2021-09-21 NOTE — Telephone Encounter (Signed)
Thanks, we will be on the look out for the monitor.

## 2021-09-22 ENCOUNTER — Ambulatory Visit (HOSPITAL_COMMUNITY): Payer: 59

## 2021-09-22 ENCOUNTER — Encounter (HOSPITAL_COMMUNITY): Payer: 59

## 2021-09-22 ENCOUNTER — Telehealth (HOSPITAL_COMMUNITY): Payer: Self-pay | Admitting: *Deleted

## 2021-09-22 NOTE — Telephone Encounter (Signed)
Patient left message on department voicemail. He will be absent from cardiac rehab today, currently on his way to [place]. Patient will be absent on Monday 09/25/21, attending a funeral.

## 2021-09-25 ENCOUNTER — Encounter (HOSPITAL_COMMUNITY): Payer: 59

## 2021-09-25 ENCOUNTER — Ambulatory Visit (HOSPITAL_COMMUNITY): Payer: 59

## 2021-09-27 ENCOUNTER — Encounter (HOSPITAL_COMMUNITY): Payer: 59

## 2021-09-27 ENCOUNTER — Ambulatory Visit (HOSPITAL_COMMUNITY): Payer: 59

## 2021-09-27 NOTE — Progress Notes (Signed)
Cardiac Individual Treatment Plan  Patient Details  Name: Nicholas [name] MRN: [id number] Date of Birth: 1955-12-01 Referring Provider:   Flowsheet Row CARDIAC REHAB PHASE II ORIENTATION from 08/22/2021 in [place]  Referring Provider Candee Furbish, MD       Initial Encounter Date:  [place] PHASE II ORIENTATION from 08/22/2021 in [place]  Date 08/22/21       Visit Diagnosis: 06/16/21 NSTEMI Medical treatment  Patient's Home Medications on Admission:  Current Outpatient Medications:    alfuzosin (UROXATRAL) 10 MG 24 hr tablet, Take 1 tablet (10 mg total) by mouth daily. (Patient taking differently: Take 10 mg by mouth every evening.), Disp: 90 tablet, Rfl: 4   amiodarone (PACERONE) 100 MG tablet, Take 1 tablet (100 mg total) by mouth daily., Disp: 90 tablet, Rfl: 3   aspirin EC 81 MG tablet, Take 81 mg by mouth daily with breakfast., Disp: , Rfl:    docusate sodium (COLACE) 100 MG capsule, Take 100 mg by mouth every evening., Disp: , Rfl:    metoprolol tartrate (LOPRESSOR) 25 MG tablet, Take 1 tablet (25 mg total) by mouth 2 (two) times daily., Disp: 180 tablet, Rfl: 3   nitroGLYCERIN (NITROSTAT) 0.4 MG SL tablet, Place 1 tablet (0.4 mg total) under the tongue every 5 (five) minutes x 3 doses as needed for chest pain., Disp: 25 tablet, Rfl: 12   rosuvastatin (CRESTOR) 40 MG tablet, Take 1 tablet (40 mg total) by mouth daily at 6 PM., Disp: 30 tablet, Rfl: 3  Past Medical History: Past Medical History:  Diagnosis Date   Arthritis    knees, hands   Cataract    Coronary artery disease    Dysrhythmia 06/2014   sinus brady with PVC'S   GERD (gastroesophageal reflux disease)    Hepatitis C    Hx of adenomatous colonic polyps 09/21/2014   Hypercholesteremia    Hypertension    Myocardial infarction ([place])     Tobacco Use: Social History   Tobacco Use  Smoking Status Former   Types: Cigarettes    Quit date: 06/10/2002   Years since quitting: 19.3  Smokeless Tobacco Never    Labs: Recent Review Flowsheet Data     Labs for ITP Cardiac and Pulmonary Rehab Latest Ref Rng & Units 12/18/2009 06/10/2014 06/11/2014   Cholestrol 0 - 200 mg/dL - - 116   LDLCALC 0 - 99 mg/dL - - 47   HDL >39 mg/dL - - 57   Trlycerides <150 mg/dL - - 60   Hemoglobin A1c <5.7 % - 6.4(H) -   TCO2 0 - 100 mmol/L 26 - -       Capillary Blood Glucose: No results found for: GLUCAP   Exercise Target Goals: Exercise Program Goal: Individual exercise prescription set using results from initial 6 min walk test and THRR while considering  patients activity barriers and safety.   Exercise Prescription Goal: Starting with aerobic activity 30 plus minutes a day, 3 days per week for initial exercise prescription. Provide home exercise prescription and guidelines that participant acknowledges understanding prior to discharge.  Activity Barriers & Risk Stratification:  Activity Barriers & Cardiac Risk Stratification - 08/22/21 1446       Activity Barriers & Cardiac Risk Stratification   Activity Barriers Deconditioning;Shortness of Breath    Cardiac Risk Stratification High             6 Minute Walk:  6 Minute Walk  [place] Name 08/22/21 1353         6 Minute Walk   Phase Initial     Distance 1245 feet     Walk Time 6 minutes     # of Rest Breaks 0     MPH 2.36     METS 3     RPE 9     Perceived Dyspnea  0     VO2 Peak 10.48     Symptoms No     Resting HR 43 bpm     Resting BP 128/78     Resting Oxygen Saturation  100 %     Exercise Oxygen Saturation  during 6 min walk 100 %     Max Ex. HR 56 bpm     Max Ex. BP 152/86     2 Minute Post BP 140/84              Oxygen Initial Assessment:   Oxygen Re-Evaluation:   Oxygen Discharge (Final Oxygen Re-Evaluation):   Initial Exercise Prescription:  Initial Exercise Prescription - 08/22/21 1400       Date of Initial Exercise RX  and Referring Provider   Date 08/22/21    Referring Provider Candee Furbish, MD    Expected Discharge Date 10/20/21      Recumbant Bike   Level 2    RPM 60    Minutes 15    METs 2.3      Track   Laps 14    Minutes 15    METs 2.62      Prescription Details   Frequency (times per week) 3    Duration Progress to 30 minutes of continuous aerobic without signs/symptoms of physical distress      Intensity   THRR 40-80% of Max Heartrate 62-124    Ratings of Perceived Exertion 11-13    Perceived Dyspnea 0-4      Progression   Progression Continue progressive overload as per policy without signs/symptoms or physical distress.      Resistance Training   Training Prescription Yes    Weight 5    Reps 10-15             Perform Capillary Blood Glucose checks as needed.  Exercise Prescription Changes:   Exercise Prescription Changes     Row Name 09/04/21 1654 09/20/21 1630           Response to Exercise   Blood Pressure (Admit) 124/80 120/68      Blood Pressure (Exercise) 160/86 138/82      Blood Pressure (Exit) 118/72 110/80      Heart Rate (Admit) 56 bpm 71 bpm      Heart Rate (Exercise) 75 bpm 87 bpm      Heart Rate (Exit) 50 bpm 66 bpm      Rating of Perceived Exertion (Exercise) 12 11      Perceived Dyspnea (Exercise) 0 0      Symptoms 0 0      Comments Pt first day in the CRP2 program Reviewed MET's      Duration Progress to 30 minutes of  aerobic without signs/symptoms of physical distress Progress to 30 minutes of  aerobic without signs/symptoms of physical distress      Intensity THRR unchanged THRR unchanged        Progression   Progression Continue to progress workloads to maintain intensity without signs/symptoms of physical distress. Continue to progress workloads to maintain intensity without signs/symptoms of physical distress.  Average METs 2.37 3.38        Resistance Training   Training Prescription Yes No      Weight 5 --      Reps 10-15 --       Time 10 Minutes --        Recumbant Bike   Level 2.2 2      RPM 60 70      Minutes 15 15      METs 2 3.55        Track   Laps 15 15      Minutes 15 22      METs 2.74 3.55               Exercise Comments:   Exercise Comments     Row Name 09/04/21 1659 09/20/21 1630 09/28/21 0721       Exercise Comments Pt's first day in the CRP2 program. pt tolerated evercise well with an average MET level of 2.37. Pt understands his THRR, RPE and Ex Rx Reviewed MET's with pt today. Pt is tolerating exercise wel with an average MET level of 3.38. Will continue to monitor pt and progress workloads as tolerated without sign or symptom. Unable to go over education and goals due to pt being absent              Exercise Goals and Review:   Exercise Goals     Row Name 08/22/21 1446             Exercise Goals   Increase Physical Activity Yes       Intervention Provide advice, education, support and counseling about physical activity/exercise needs.;Develop an individualized exercise prescription for aerobic and resistive training based on initial evaluation findings, risk stratification, comorbidities and participant's personal goals.       Expected Outcomes Long Term: Exercising regularly at least 3-5 days a week.;Long Term: Add in home exercise to make exercise part of routine and to increase amount of physical activity.;Short Term: Attend rehab on a regular basis to increase amount of physical activity.       Increase Strength and Stamina Yes       Intervention Provide advice, education, support and counseling about physical activity/exercise needs.;Develop an individualized exercise prescription for aerobic and resistive training based on initial evaluation findings, risk stratification, comorbidities and participant's personal goals.       Expected Outcomes Short Term: Increase workloads from initial exercise prescription for resistance, speed, and METs.;Short Term: Perform  resistance training exercises routinely during rehab and add in resistance training at home;Long Term: Improve cardiorespiratory fitness, muscular endurance and strength as measured by increased METs and functional capacity (6MWT)       Able to understand and use rate of perceived exertion (RPE) scale Yes       Intervention Provide education and explanation on how to use RPE scale       Expected Outcomes Short Term: Able to use RPE daily in rehab to express subjective intensity level;Long Term:  Able to use RPE to guide intensity level when exercising independently       Knowledge and understanding of Target Heart Rate Range (THRR) Yes       Intervention Provide education and explanation of THRR including how the numbers were predicted and where they are located for reference       Expected Outcomes Short Term: Able to state/look up THRR;Short Term: Able to use daily as guideline for intensity in rehab;Long Term: Able  to use THRR to govern intensity when exercising independently       Understanding of Exercise Prescription Yes       Intervention Provide education, explanation, and written materials on patient's individual exercise prescription       Expected Outcomes Short Term: Able to explain program exercise prescription;Long Term: Able to explain home exercise prescription to exercise independently                Exercise Goals Re-Evaluation :  Exercise Goals Re-Evaluation     [place] Name 09/04/21 1657             Exercise Goal Re-Evaluation   Exercise Goals Review Increase Physical Activity;Increase Strength and Stamina;Able to understand and use rate of perceived exertion (RPE) scale;Knowledge and understanding of Target Heart Rate Range (THRR);Understanding of Exercise Prescription       Comments Pt's first day in the CRP2 program. pt tolerated evercise well with an average MET level of 2.37. Pt understands his THRR, RPE and Ex Rx       Expected Outcomes Will continue to monitor pt  and progress workloads as tolerated without sign or symptom                 Discharge Exercise Prescription (Final Exercise Prescription Changes):  Exercise Prescription Changes - 09/20/21 1630       Response to Exercise   Blood Pressure (Admit) 120/68    Blood Pressure (Exercise) 138/82    Blood Pressure (Exit) 110/80    Heart Rate (Admit) 71 bpm    Heart Rate (Exercise) 87 bpm    Heart Rate (Exit) 66 bpm    Rating of Perceived Exertion (Exercise) 11    Perceived Dyspnea (Exercise) 0    Symptoms 0    Comments Reviewed MET's    Duration Progress to 30 minutes of  aerobic without signs/symptoms of physical distress    Intensity THRR unchanged      Progression   Progression Continue to progress workloads to maintain intensity without signs/symptoms of physical distress.    Average METs 3.38      Resistance Training   Training Prescription No      Recumbant Bike   Level 2    RPM 70    Minutes 15    METs 3.55      Track   Laps 15    Minutes 22    METs 3.55             Nutrition:  Target Goals: Understanding of nutrition guidelines, daily intake of sodium <1571m, cholesterol <2066m calories 30% from fat and 7% or less from saturated fats, daily to have 5 or more servings of fruits and vegetables.  Biometrics:  Pre Biometrics - 08/22/21 1315       Pre Biometrics   Waist Circumference 36 inches    Hip Circumference 40.5 inches    Waist to Hip Ratio 0.89 %    Triceps Skinfold 8 mm    % Body Fat 21.6 %    Grip Strength 56 kg    Flexibility 12.5 in    Single Leg Stand 30 seconds              Nutrition Therapy Plan and Nutrition Goals:   Nutrition Assessments:  MEDIFICTS Score Key: ?70 Need to make dietary changes  40-70 Heart Healthy Diet ? 40 Therapeutic Level Cholesterol Diet   Picture Your Plate Scores: <4<38nhealthy dietary pattern with much room for improvement. 41-50 Dietary pattern  unlikely to meet recommendations for good health  and room for improvement. 51-60 More healthful dietary pattern, with some room for improvement.  >60 Healthy dietary pattern, although there may be some specific behaviors that could be improved.    Nutrition Goals Re-Evaluation:   Nutrition Goals Discharge (Final Nutrition Goals Re-Evaluation):   Psychosocial: Target Goals: Acknowledge presence or absence of significant depression and/or stress, maximize coping skills, provide positive support system. Participant is able to verbalize types and ability to use techniques and skills needed for reducing stress and depression.  Initial Review & Psychosocial Screening:  Initial Psych Review & Screening - 08/22/21 1501       Initial Review   Current issues with None Identified      Family Dynamics   Good Support System? Yes   Dougles has his wife and children for support     Barriers   Psychosocial barriers to participate in program There are no identifiable barriers or psychosocial needs.      Screening Interventions   Interventions Encouraged to exercise             Quality of Life Scores:  Quality of Life - 08/22/21 1454       Quality of Life   Select Quality of Life      Quality of Life Scores   Health/Function Pre 24 %    Socioeconomic Pre 28.31 %    Psych/Spiritual Pre 28.29 %    Family Pre 28.8 %    GLOBAL Pre 26.53 %            Scores of 19 and below usually indicate a poorer quality of life in these areas.  A difference of  2-3 points is a clinically meaningful difference.  A difference of 2-3 points in the total score of the Quality of Life Index has been associated with significant improvement in overall quality of life, self-image, physical symptoms, and general health in studies assessing change in quality of life.  PHQ-9: Recent Review Flowsheet Data     Depression screen [facility] 2/9 08/22/2021 08/22/2021 12/19/2015 07/21/2015 05/12/2015   Decreased Interest 0 0 0 0 0   Down, Depressed, Hopeless 0 0 0 0 0    PHQ - 2 Score 0 0 0 0 0      Interpretation of Total Score  Total Score Depression Severity:  1-4 = Minimal depression, 5-9 = Mild depression, 10-14 = Moderate depression, 15-19 = Moderately severe depression, 20-27 = Severe depression   Psychosocial Evaluation and Intervention:   Psychosocial Re-Evaluation:  Psychosocial Re-Evaluation     [place] Name 09/05/21 0808 09/27/21 1711           Psychosocial Re-Evaluation   Current issues with None Identified None Identified      Interventions Encouraged to attend Cardiac Rehabilitation for the exercise Encouraged to attend Cardiac Rehabilitation for the exercise      Continue Psychosocial Services  No Follow up required No Follow up required               Psychosocial Discharge (Final Psychosocial Re-Evaluation):  Psychosocial Re-Evaluation - 09/27/21 1711       Psychosocial Re-Evaluation   Current issues with None Identified    Interventions Encouraged to attend Cardiac Rehabilitation for the exercise    Continue Psychosocial Services  No Follow up required             Vocational Rehabilitation: Provide vocational rehab assistance to qualifying candidates.   Vocational Rehab Evaluation & Intervention:  Vocational Rehab - 08/22/21 1501       Initial Vocational Rehab Evaluation & Intervention   Assessment shows need for Vocational Rehabilitation No   Markian works part time and does not need vocational rehab at this time.            Education: Education Goals: Education classes will be provided on a weekly basis, covering required topics. Participant will state understanding/return demonstration of topics presented.  Learning Barriers/Preferences:  Learning Barriers/Preferences - 08/22/21 1457       Learning Barriers/Preferences   Learning Barriers Sight   wears glasses   Learning Preferences Group Instruction;Individual Instruction;Pictoral;Computer/Internet;Video;Skilled Demonstration              Education Topics: Hypertension, Hypertension Reduction -Define heart disease and high blood pressure. Discus how high blood pressure affects the body and ways to reduce high blood pressure.   Exercise and Your Heart -Discuss why it is important to exercise, the FITT principles of exercise, normal and abnormal responses to exercise, and how to exercise safely.   Angina -Discuss definition of angina, causes of angina, treatment of angina, and how to decrease risk of having angina.   Cardiac Medications -Review what the following cardiac medications are used for, how they affect the body, and side effects that may occur when taking the medications.  Medications include Aspirin, Beta blockers, calcium channel blockers, ACE Inhibitors, angiotensin receptor blockers, diuretics, digoxin, and antihyperlipidemics.   Congestive Heart Failure -Discuss the definition of CHF, how to live with CHF, the signs and symptoms of CHF, and how keep track of weight and sodium intake.   Heart Disease and Intimacy -Discus the effect sexual activity has on the heart, how changes occur during intimacy as we age, and safety during sexual activity.   Smoking Cessation / COPD -Discuss different methods to quit smoking, the health benefits of quitting smoking, and the definition of COPD.   Nutrition I: Fats -Discuss the types of cholesterol, what cholesterol does to the heart, and how cholesterol levels can be controlled.   Nutrition II: Labels -Discuss the different components of food labels and how to read food label   Heart Parts/Heart Disease and PAD -Discuss the anatomy of the heart, the pathway of blood circulation through the heart, and these are affected by heart disease.   Stress I: Signs and Symptoms -Discuss the causes of stress, how stress may lead to anxiety and depression, and ways to limit stress.   Stress II: Relaxation -Discuss different types of relaxation techniques to limit  stress.   Warning Signs of Stroke / TIA -Discuss definition of a stroke, what the signs and symptoms are of a stroke, and how to identify when someone is having stroke.   Knowledge Questionnaire Score:  Knowledge Questionnaire Score - 08/22/21 1456       Knowledge Questionnaire Score   Pre Score 17/24             Core Components/Risk Factors/Patient Goals at Admission:  Personal Goals and Risk Factors at Admission - 08/22/21 1456       Core Components/Risk Factors/Patient Goals on Admission    Weight Management Weight Maintenance    Hypertension Yes    Intervention Provide education on lifestyle modifcations including regular physical activity/exercise, weight management, moderate sodium restriction and increased consumption of fresh fruit, vegetables, and low fat dairy, alcohol moderation, and smoking cessation.;Monitor prescription use compliance.    Expected Outcomes Short Term: Continued assessment and intervention until BP is < 140/11m HG in  hypertensive participants. < 130/44m HG in hypertensive participants with diabetes, heart failure or chronic kidney disease.;Long Term: Maintenance of blood pressure at goal levels.    Lipids Yes    Intervention Provide education and support for participant on nutrition & aerobic/resistive exercise along with prescribed medications to achieve LDL <721m HDL >4059m   Expected Outcomes Short Term: Participant states understanding of desired cholesterol values and is compliant with medications prescribed. Participant is following exercise prescription and nutrition guidelines.;Long Term: Cholesterol controlled with medications as prescribed, with individualized exercise RX and with personalized nutrition plan. Value goals: LDL < 36m36mDL > 40 mg.             Core Components/Risk Factors/Patient Goals Review:   Goals and Risk Factor Review     Row Name 09/05/21 0809 09/27/21 1712           Core Components/Risk Factors/Patient  Goals Review   Personal Goals Review Weight Management/Obesity;Hypertension;Lipids Weight Management/Obesity;Hypertension;Lipids      Review TerrDavonerted exercise at cardiac rehab on 09/04/21.. TeMarland KitchenrJayland well with exercise. Vital signs were stable. TerrSherley been doing well with exercise at cardiac rehab. No reports of palpitations with exercise, occasional PVC's noted. Vital signs have been stable.      Expected Outcomes TerrGasparl continue to participate in phase 2 cardiac rehab for exercise, nutrition and lifestyle modifications TerrMarcelliusl continue to participate in phase 2 cardiac rehab for exercise, nutrition and lifestyle modifications               Core Components/Risk Factors/Patient Goals at Discharge (Final Review):   Goals and Risk Factor Review - 09/27/21 1712       Core Components/Risk Factors/Patient Goals Review   Personal Goals Review Weight Management/Obesity;Hypertension;Lipids    Review TerrLekendrick been doing well with exercise at cardiac rehab. No reports of palpitations with exercise, occasional PVC's noted. Vital signs have been stable.    Expected Outcomes TerrJacquisel continue to participate in phase 2 cardiac rehab for exercise, nutrition and lifestyle modifications             ITP Comments:  ITP Comments     Row Name 08/22/21 1401 09/05/21 0806 09/27/21 1711       ITP Comments Dr tracFransico Him Medical Director 30 Day ITP Review. TerrDaytonrted  cardiac rehab on 09/05/21 and did well with exercise. 30 Day ITP Review. TerrPlaton good participation in phase 2 cardiac rehab when in attendance.              Comments: See ITP comments.MariHarrell GaveBSN

## 2021-09-29 ENCOUNTER — Ambulatory Visit (HOSPITAL_COMMUNITY): Payer: 59

## 2021-09-29 ENCOUNTER — Encounter (HOSPITAL_COMMUNITY): Payer: 59

## 2021-10-04 ENCOUNTER — Encounter (HOSPITAL_COMMUNITY): Payer: 59

## 2021-10-04 ENCOUNTER — Ambulatory Visit (HOSPITAL_COMMUNITY): Payer: 59

## 2021-10-04 NOTE — Progress Notes (Signed)
Electrophysiology Office Follow up Visit Note:    Date:  10/05/2021   ID:  Nicholas [name], DOB 02/19/1956, MRN [id number]  PCP:  Aurea Graff.Marlou Sa, MD  [place] Cardiologist:  Candee Furbish, MD  [facility] Electrophysiologist:  Vickie Epley, MD    Interval History:    Nicholas [name] is a 65 y.o. male who presents for a follow up visit. I last saw the patient while they were hospitalized in September 2022. He has a history fo CABG in 2003, HTN, HLD and PVCs. He was admitted after EMS was called for dizziness. ECG showed bigeminal PVCs. He has a basal inferior aneurysm within the LV. Amiodarone was started in the hospital. ICD was not recommended.   He is with family today in clinic who I have previously met.  He has been doing well.     Past Medical History:  Diagnosis Date   Arthritis    knees, hands   Cataract    Coronary artery disease    Dysrhythmia 06/2014   sinus brady with PVC'S   GERD (gastroesophageal reflux disease)    Hepatitis C    Hx of adenomatous colonic polyps 09/21/2014   Hypercholesteremia    Hypertension    Myocardial infarction [facility])     Past Surgical History:  Procedure Laterality Date   CARDIAC CATHETERIZATION     CARDIAC SURGERY     triple bypass   COLONOSCOPY     CORONARY ARTERY BYPASS GRAFT     CABG x 3 Vessels   LEFT HEART CATH AND CORS/GRAFTS ANGIOGRAPHY N/A 06/19/2021   Procedure: LEFT HEART CATH AND CORS/GRAFTS ANGIOGRAPHY;  Surgeon: Belva Crome, MD;  Location: [place] [facility];  Service: Cardiovascular;  Laterality: N/A;    Current Medications: Current Meds  Medication Sig   alfuzosin (UROXATRAL) 10 MG 24 hr tablet Take 1 tablet (10 mg total) by mouth daily. (Patient taking differently: Take 10 mg by mouth every evening.)   amiodarone (PACERONE) 100 MG tablet Take 1 tablet (100 mg total) by mouth daily.   aspirin EC 81 MG tablet Take 81 mg by mouth daily with breakfast.   docusate sodium (COLACE) 100 MG capsule Take 100  mg by mouth every evening.   nitroGLYCERIN (NITROSTAT) 0.4 MG SL tablet Place 1 tablet (0.4 mg total) under the tongue every 5 (five) minutes x 3 doses as needed for chest pain.   rosuvastatin (CRESTOR) 40 MG tablet Take 1 tablet (40 mg total) by mouth daily at 6 PM.   [DISCONTINUED] metoprolol tartrate (LOPRESSOR) 25 MG tablet Take 1 tablet (25 mg total) by mouth 2 (two) times daily.     Allergies:   Patient has no known allergies.   Social History   Socioeconomic History   Marital status: Married    Spouse name: Not on file   Number of children: Not on file   Years of education: 11   Highest education level: 11th grade  Occupational History   Not on file  Tobacco Use   Smoking status: Former    Types: Cigarettes    Quit date: 06/10/2002    Years since quitting: 19.3   Smokeless tobacco: Never  Vaping Use   Vaping Use: Never used  Substance and Sexual Activity   Alcohol use: Yes    Alcohol/week: 7.0 standard drinks    Types: 7 Cans of beer per week   Drug use: Yes    Frequency: 3.0 times per week    Types: Marijuana  Comment: occ   Sexual activity: Yes  Other Topics Concern   Not on file  Social History Narrative   Not on file   Social Determinants of Health   Financial Resource Strain: Not on file  Food Insecurity: Not on file  Transportation Needs: Not on file  Physical Activity: Not on file  Stress: Not on file  Social Connections: Not on file     Family History: The patient's family history includes Cancer in his mother; Diabetes in his mother; Diabetic kidney disease in his mother; Esophageal cancer in his mother; Hypertension in his father and mother; Kidney disease in his mother; Other in his father; Throat cancer in his mother. There is no history of Heart attack, Colon cancer, Stomach cancer, Rectal cancer, or Colon polyps.  ROS:   Please see the history of present illness.    All other systems reviewed and are negative.  EKGs/Labs/Other Studies  Reviewed:    The following studies were reviewed today:  06/2021 Cardiac MRI 1. Normal left ventricular size, with LVEDD 43 mm, and LVEDVi 67 mL/m2.  Normal left ventricular thickness, with intraventricular septal thickness of 8 mm, posterior wall thickness of 9 mm, and septal to posterior ratio < 1.5.  Myocardial mass index normal at 70 g/m2.  Mildly reduced left ventricular systolic function (LVEF =26%). There is inferior and inferoseptal dyskinesis.  Left ventricular parametric mapping notable for significant elevated in the ECV signal in the areas of dyskinesis (basal inferior 71% with increase inferolateral ECV of 49% base and 44% mid) and normal T2 signal.  There is late gadolinium enhancement in the left ventricular myocardium. Transmural in the inferoseptal and inferior base with associated thinning, there is up to 50% subendocardial LGE in the inferolateral based to mid.  2. Normal right ventricular size with RVEDVI 80 mL/m2.  Normal right ventricular thickness.  Moderate right ventricular systolic dysfunction (RVEF =32%). Abnormal basal RV function without aneurysm.  3.  Normal left and right atrial size.  4. Normal size of the aortic root, ascending aorta and pulmonary artery.  5.  Valve disease as below.  Tri-leaflet aortic valve. Not suggestive of significant aortic stenosis, mild to moderate regurgitation by regurgitation fraction (16%), though this may be overestimated in the setting of notable artifact.  Pulmonary valve not suggestive of significant stenosis or regurgitation  Tricuspid valve with qualitatively mild regurgitation.  Mitral valve is central and mild based on LV to RV comparison (given flow related artifact).  6.  Normal pericardium.  No pericardial effusion.  7. Grossly, no extracardiac findings. Recommended dedicated study if concerned for non-cardiac pathology.  8. Notable PVCs during procedure decreases accuracy of volumetric assessment and  valve quantification. There is artifact related to sternotomy wires. Breath hold artifact during flow acquisitions.  09/21/2021 Zio HR 40 - 139 bpm, average 56 bpm. 1 SVT lasting 10 beats, likely atrial tachycardia. Rare supraventricular ectopy.  Occasional ventricular ectopy, 1.2%. No sustained arrhythmias.   EKG:  The ekg ordered today demonstrates sinus rhythm.  No PVCs on twelve-lead or rhythm strip.  Recent Labs: 09/19/2021: BUN 18; Creatinine, Ser 1.40; Hemoglobin 14.2; Magnesium 2.0; Platelets 184; Potassium 4.3; Sodium 136; TSH 1.325  Recent Lipid Panel    Component Value Date/Time   CHOL 116 06/11/2014 0100   TRIG 60 06/11/2014 0100   HDL 57 06/11/2014 0100   CHOLHDL 2.0 06/11/2014 0100   VLDL 12 06/11/2014 0100   LDLCALC 47 06/11/2014 0100    Physical Exam:    VS:  BP 120/86    Pulse (!) 42    Ht 5' 10.75" (1.797 m)    Wt 179 lb 3.2 oz (81.3 kg)    SpO2 98%    BMI 25.17 kg/m     Wt Readings from Last 3 Encounters:  10/05/21 179 lb 3.2 oz (81.3 kg)  08/22/21 174 lb 6.1 oz (79.1 kg)  07/13/21 170 lb 12.8 oz (77.5 kg)     GEN:  Well nourished, well developed in no acute distress HEENT: Normal NECK: No JVD; No carotid bruits LYMPHATICS: No lymphadenopathy CARDIAC: RRR, no murmurs, rubs, gallops RESPIRATORY:  Clear to auscultation without rales, wheezing or rhonchi  ABDOMEN: Soft, non-tender, non-distended MUSCULOSKELETAL:  No edema; No deformity  SKIN: Warm and dry NEUROLOGIC:  Alert and oriented x 3 PSYCHIATRIC:  Normal affect        ASSESSMENT:    1. NSVT (nonsustained ventricular tachycardia)   2. Primary hypertension   3. Chronic combined systolic and diastolic CHF (congestive heart failure) ([place])   4. Encounter for long-term (current) use of high-risk medication    PLAN:    In order of problems listed above:  #NSVT Electrically quiescent on amiodarone.  Recommend continuing the current dosage.  Discussed how PVCs can come and go without  clear triggers for increased burden.  I reassured him that this is normal behavior of PVCs.  No red flag symptoms.  Recommend he continue amiodarone.  We will check TSH, free T4 and a CMP today.  We will transition his metoprolol to the long-acting version to help simplify his medication regimen.  #Hypertension Controlled Continue current medical therapy.  #Chronic combined systolic and diastolic heart failure NYHA class II.  Warm and dry on exam.  Continue metoprolol, amiodarone.  Left ventricular function very mildly reduced on the September echo.  Follow-up 6 months with APP or sooner as needed.     Medication Adjustments/Labs and Tests Ordered: Current medicines are reviewed at length with the patient today.  Concerns regarding medicines are outlined above.  No orders of the defined types were placed in this encounter.  No orders of the defined types were placed in this encounter.    Signed, Lars Mage, MD, [facility], [facility] 10/05/2021 8:59 AM    Electrophysiology [facility]

## 2021-10-05 ENCOUNTER — Encounter: Payer: Self-pay | Admitting: Cardiology

## 2021-10-05 ENCOUNTER — Ambulatory Visit: Payer: 59 | Admitting: Cardiology

## 2021-10-05 ENCOUNTER — Other Ambulatory Visit (HOSPITAL_COMMUNITY): Payer: Self-pay

## 2021-10-05 ENCOUNTER — Other Ambulatory Visit: Payer: Self-pay

## 2021-10-05 VITALS — BP 120/86 | HR 42 | Ht 70.75 in | Wt 179.2 lb

## 2021-10-05 DIAGNOSIS — I5042 Chronic combined systolic (congestive) and diastolic (congestive) heart failure: Secondary | ICD-10-CM | POA: Diagnosis not present

## 2021-10-05 DIAGNOSIS — Z79899 Other long term (current) drug therapy: Secondary | ICD-10-CM | POA: Diagnosis not present

## 2021-10-05 DIAGNOSIS — I10 Essential (primary) hypertension: Secondary | ICD-10-CM | POA: Diagnosis not present

## 2021-10-05 DIAGNOSIS — I4729 Other ventricular tachycardia: Secondary | ICD-10-CM | POA: Diagnosis not present

## 2021-10-05 LAB — COMPREHENSIVE METABOLIC PANEL
ALT: 11 IU/L (ref 0–44)
AST: 23 IU/L (ref 0–40)
Albumin/Globulin Ratio: 1.4 (ref 1.2–2.2)
Albumin: 4 g/dL (ref 3.8–4.8)
Alkaline Phosphatase: 55 IU/L (ref 44–121)
BUN/Creatinine Ratio: 11 (ref 10–24)
BUN: 15 mg/dL (ref 8–27)
Bilirubin Total: <0.2 mg/dL (ref 0.0–1.2)
CO2: 23 mmol/L (ref 20–29)
Calcium: 9.1 mg/dL (ref 8.6–10.2)
Chloride: 104 mmol/L (ref 96–106)
Creatinine, Ser: 1.33 mg/dL — ABNORMAL HIGH (ref 0.76–1.27)
Globulin, Total: 2.9 g/dL (ref 1.5–4.5)
Glucose: 108 mg/dL — ABNORMAL HIGH (ref 70–99)
Potassium: 4.5 mmol/L (ref 3.5–5.2)
Sodium: 139 mmol/L (ref 134–144)
Total Protein: 6.9 g/dL (ref 6.0–8.5)
eGFR: 59 mL/min/{1.73_m2} — ABNORMAL LOW (ref >59)

## 2021-10-05 LAB — T4, FREE: Free T4: 1.43 ng/dL (ref 0.82–1.77)

## 2021-10-05 LAB — TSH: TSH: 0.964 u[IU]/mL (ref 0.450–4.500)

## 2021-10-05 MED ORDER — METOPROLOL SUCCINATE ER 25 MG PO TB24
25.0000 mg | ORAL_TABLET | Freq: Every day | ORAL | 3 refills | Status: DC
Start: 2021-10-05 — End: 2022-10-04
  Filled 2021-10-05: qty 90, 90d supply, fill #0
  Filled 2022-01-08: qty 90, 90d supply, fill #1
  Filled 2022-04-09: qty 90, 90d supply, fill #2
  Filled 2022-07-09: qty 90, 90d supply, fill #3

## 2021-10-05 NOTE — Patient Instructions (Addendum)
Medication Instructions:  Your physician has recommended you make the following change in your medication:    STOP metoprolol tartrate  2.    START taking metoprolol succinate (Toprol XL) 25 mg-  Take one tablet by mouth daily   Lab Work: YOu will get lab work today:  CMP, TSH and free T4 If you have labs (blood work) drawn today and your tests are completely normal, you will receive your results only by: [employer] (if you have MyChart) OR A paper copy in the mail If you have any lab test that is abnormal or we need to change your treatment, we will call you to review the results.  Testing/Procedures: None ordered.  Follow-Up: At [facility], you and your health needs are our priority.  As part of our continuing mission to provide you with exceptional heart care, we have created designated Provider Care Teams.  These Care Teams include your primary Cardiologist (physician) and Advanced Practice Providers (APPs -  Physician Assistants and Nurse Practitioners) who all work together to provide you with the care you need, when you need it.  Your next appointment:   Your physician wants you to follow-up in: 6 months with one of the following Advanced Practice Providers on your designated Care Team:   Tommye Standard, [place] Legrand Como "Jonni Sanger" [place], [place] You will receive a reminder letter in the mail two months in advance. If you don't receive a letter, please call our office to schedule the follow-up appointment.

## 2021-10-06 ENCOUNTER — Telehealth (HOSPITAL_COMMUNITY): Payer: Self-pay | Admitting: *Deleted

## 2021-10-06 ENCOUNTER — Encounter (HOSPITAL_COMMUNITY): Payer: 59

## 2021-10-06 ENCOUNTER — Ambulatory Visit (HOSPITAL_COMMUNITY): Payer: 59

## 2021-10-06 NOTE — Telephone Encounter (Signed)
Left message to call cardiac rehab.Barnet Pall, RN,BSN 10/06/2021 1:35 PM

## 2021-10-11 ENCOUNTER — Encounter (HOSPITAL_COMMUNITY): Payer: 59

## 2021-10-11 ENCOUNTER — Ambulatory Visit (HOSPITAL_COMMUNITY): Payer: 59

## 2021-10-13 ENCOUNTER — Ambulatory Visit (HOSPITAL_COMMUNITY): Payer: 59

## 2021-10-13 ENCOUNTER — Encounter (HOSPITAL_COMMUNITY): Payer: 59

## 2021-10-13 ENCOUNTER — Telehealth (HOSPITAL_COMMUNITY): Payer: Self-pay | Admitting: *Deleted

## 2021-10-13 NOTE — Telephone Encounter (Signed)
Left message to call cardiac rehab. Will discharge. [place] attended 5 exercise sessions between 09/04/21-09/20/21. [place] did well with exercise when in attendance.Barnet Pall, RN,BSN 10/13/2021 4:25 PM

## 2021-10-16 ENCOUNTER — Ambulatory Visit (HOSPITAL_COMMUNITY): Payer: 59

## 2021-10-16 ENCOUNTER — Encounter (HOSPITAL_COMMUNITY): Payer: 59

## 2021-10-18 ENCOUNTER — Ambulatory Visit (HOSPITAL_COMMUNITY): Payer: 59

## 2021-10-18 ENCOUNTER — Other Ambulatory Visit: Payer: Self-pay

## 2021-10-18 ENCOUNTER — Other Ambulatory Visit (HOSPITAL_COMMUNITY): Payer: Self-pay

## 2021-10-18 ENCOUNTER — Encounter (HOSPITAL_COMMUNITY): Payer: 59

## 2021-10-20 ENCOUNTER — Other Ambulatory Visit: Payer: Self-pay

## 2021-10-20 ENCOUNTER — Encounter (HOSPITAL_COMMUNITY): Payer: 59

## 2021-10-20 ENCOUNTER — Other Ambulatory Visit (HOSPITAL_COMMUNITY): Payer: Self-pay

## 2021-10-20 ENCOUNTER — Ambulatory Visit (HOSPITAL_COMMUNITY): Payer: 59

## 2021-10-23 ENCOUNTER — Other Ambulatory Visit (HOSPITAL_COMMUNITY): Payer: Self-pay

## 2021-10-23 ENCOUNTER — Telehealth: Payer: Self-pay | Admitting: Cardiology

## 2021-10-23 MED ORDER — ROSUVASTATIN CALCIUM 40 MG PO TABS
40.0000 mg | ORAL_TABLET | Freq: Every day | ORAL | 2 refills | Status: DC
  Filled 2021-10-23: qty 90, 90d supply, fill #0
  Filled 2022-01-29: qty 90, 90d supply, fill #1
  Filled 2022-04-23: qty 90, 90d supply, fill #2

## 2021-10-23 NOTE — Telephone Encounter (Signed)
°*  STAT* If patient is at the pharmacy, call can be transferred to refill team.   1. Which medications need to be refilled? (please list name of each medication and dose if known) rosuvastatin (CRESTOR) 40 MG tablet  2. Which pharmacy/location (including street and city if local pharmacy) is medication to be sent to? [facility]  3. Do they need a 30 day or 90 day supply? [place]

## 2021-10-23 NOTE — Telephone Encounter (Signed)
Pt's medication was sent to pt's pharmacy as requested. Confirmation received.  °

## 2021-10-24 ENCOUNTER — Other Ambulatory Visit (HOSPITAL_COMMUNITY): Payer: Self-pay

## 2021-11-13 ENCOUNTER — Other Ambulatory Visit (HOSPITAL_COMMUNITY): Payer: Self-pay

## 2021-11-15 ENCOUNTER — Ambulatory Visit: Payer: 59 | Admitting: Cardiology

## 2021-11-21 ENCOUNTER — Ambulatory Visit: Payer: 59 | Admitting: Cardiology

## 2021-12-13 ENCOUNTER — Telehealth: Payer: Self-pay | Admitting: Cardiology

## 2021-12-13 NOTE — Telephone Encounter (Signed)
Spoke with wife who reports pt has knee pain from arthritis.  She is asking what type of OTC pain medication pt can use for this (with his cardiac history and current meds).  Advised Tylenol as directed.  Pt thought Dr Marlou Porch told him something else he can take but doesn't remember what it was .  Advised I will review with Dr Marlou Porch and called back if any further recommendations.   ?

## 2021-12-13 NOTE — Telephone Encounter (Signed)
Agree, Tylenol is the best thing for the pain.  For short period of time he could try some Voltaren gel.  ?Nicholas Furbish, MD  ?

## 2021-12-13 NOTE — Telephone Encounter (Signed)
Pt c/o medication issue: ? ?1. Name of Medication:  ? ?2. How are you currently taking this medication (dosage and times per day)?  ? ?3. Are you having a reaction (difficulty breathing--STAT)?  ? ?4. What is your medication issue?  ? ? ?Patient's wife would like to review patient's medications and discuss which medications are safe to take for inflammation. Please advise. ?

## 2022-01-08 ENCOUNTER — Other Ambulatory Visit (HOSPITAL_COMMUNITY): Payer: Self-pay

## 2022-01-09 ENCOUNTER — Other Ambulatory Visit (HOSPITAL_COMMUNITY): Payer: Self-pay

## 2022-01-09 DIAGNOSIS — I2510 Atherosclerotic heart disease of native coronary artery without angina pectoris: Secondary | ICD-10-CM | POA: Diagnosis not present

## 2022-01-09 DIAGNOSIS — N400 Enlarged prostate without lower urinary tract symptoms: Secondary | ICD-10-CM | POA: Diagnosis not present

## 2022-01-09 DIAGNOSIS — E7800 Pure hypercholesterolemia, unspecified: Secondary | ICD-10-CM | POA: Diagnosis not present

## 2022-01-09 DIAGNOSIS — M2550 Pain in unspecified joint: Secondary | ICD-10-CM | POA: Diagnosis not present

## 2022-01-09 DIAGNOSIS — I471 Supraventricular tachycardia: Secondary | ICD-10-CM | POA: Diagnosis not present

## 2022-01-09 DIAGNOSIS — I10 Essential (primary) hypertension: Secondary | ICD-10-CM | POA: Diagnosis not present

## 2022-01-09 MED ORDER — DICLOFENAC SODIUM 1 % EX GEL
1.0000 "application " | Freq: Two times a day (BID) | CUTANEOUS | 5 refills | Status: DC | PRN
  Filled 2022-01-09: qty 100, 13d supply, fill #0

## 2022-01-22 ENCOUNTER — Other Ambulatory Visit (HOSPITAL_COMMUNITY): Payer: Self-pay

## 2022-01-29 ENCOUNTER — Other Ambulatory Visit (HOSPITAL_COMMUNITY): Payer: Self-pay

## 2022-02-14 ENCOUNTER — Other Ambulatory Visit (HOSPITAL_COMMUNITY): Payer: Self-pay

## 2022-03-12 ENCOUNTER — Other Ambulatory Visit (HOSPITAL_COMMUNITY): Payer: Self-pay

## 2022-03-22 ENCOUNTER — Other Ambulatory Visit (HOSPITAL_COMMUNITY): Payer: Self-pay

## 2022-03-23 DIAGNOSIS — H5203 Hypermetropia, bilateral: Secondary | ICD-10-CM | POA: Diagnosis not present

## 2022-04-09 ENCOUNTER — Other Ambulatory Visit (HOSPITAL_COMMUNITY): Payer: Self-pay

## 2022-04-17 NOTE — Progress Notes (Signed)
PCP:  Alroy Dust, L.Marlou Sa, MD Primary Cardiologist: Candee Furbish, MD Electrophysiologist: Vickie Epley, MD   Nicholas [name] is a 66 y.o. male seen today for Vickie Epley, MD for routine electrophysiology followup.  Since last being seen in our clinic the patient reports doing very well.  he denies chest pain, palpitations, dyspnea, PND, orthopnea, nausea, vomiting, dizziness, syncope, edema, weight gain, or early satiety.  Past Medical History:  Diagnosis Date   Arthritis    knees, hands   Cataract    Coronary artery disease    Dysrhythmia 06/2014   sinus brady with PVC'S   GERD (gastroesophageal reflux disease)    Hepatitis C    Hx of adenomatous colonic polyps 09/21/2014   Hypercholesteremia    Hypertension    Myocardial infarction [facility])    Past Surgical History:  Procedure Laterality Date   CARDIAC CATHETERIZATION     CARDIAC SURGERY     triple bypass   COLONOSCOPY     CORONARY ARTERY BYPASS GRAFT     CABG x 3 Vessels   LEFT HEART CATH AND CORS/GRAFTS ANGIOGRAPHY N/A 06/19/2021   Procedure: LEFT HEART CATH AND CORS/GRAFTS ANGIOGRAPHY;  Surgeon: Belva Crome, MD;  Location: [place] [facility];  Service: Cardiovascular;  Laterality: N/A;    Current Outpatient Medications  Medication Sig Dispense Refill   alfuzosin (UROXATRAL) 10 MG 24 hr tablet Take 1 tablet (10 mg total) by mouth daily. (Patient taking differently: Take 10 mg by mouth every evening.) 90 tablet 4   amiodarone (PACERONE) 100 MG tablet Take 1 tablet (100 mg total) by mouth daily. 90 tablet 3   aspirin EC 81 MG tablet Take 81 mg by mouth daily with breakfast.     docusate sodium (COLACE) 100 MG capsule Take 100 mg by mouth every evening.     metoprolol succinate (TOPROL XL) 25 MG 24 hr tablet Take 1 tablet (25 mg total) by mouth daily. 90 tablet 3   nitroGLYCERIN (NITROSTAT) 0.4 MG SL tablet Place 1 tablet (0.4 mg total) under the tongue every 5 (five) minutes x 3 doses as needed for chest pain. 25  tablet 12   rosuvastatin (CRESTOR) 40 MG tablet Take 1 tablet (40 mg total) by mouth daily at 6 PM. 90 tablet 2   diclofenac Sodium (VOLTAREN) 1 % GEL Apply 4 grams topically 2 (two) times daily as needed. (Patient not taking: Reported on 04/24/2022) 100 g 5   No current facility-administered medications for this visit.    No Known Allergies  Social History   Socioeconomic History   Marital status: Married    Spouse name: Not on file   Number of children: Not on file   Years of education: 11   Highest education level: 11th grade  Occupational History   Not on file  Tobacco Use   Smoking status: Former    Types: Cigarettes    Quit date: 06/10/2002    Years since quitting: 19.8   Smokeless tobacco: Never  Vaping Use   Vaping Use: Never used  Substance and Sexual Activity   Alcohol use: Yes    Alcohol/week: 7.0 standard drinks of alcohol    Types: 7 Cans of beer per week   Drug use: Yes    Frequency: 3.0 times per week    Types: Marijuana    Comment: occ   Sexual activity: Yes  Other Topics Concern   Not on file  Social History Narrative   Not on file  Social Determinants of Health   Financial Resource Strain: Not on file  Food Insecurity: Not on file  Transportation Needs: Not on file  Physical Activity: Not on file  Stress: Not on file  Social Connections: Not on file  Intimate Partner Violence: Not on file     Review of Systems: All other systems reviewed and are otherwise negative except as noted above.  Physical Exam: Vitals:   04/24/22 1041  BP: 124/86  Pulse: (!) 50  SpO2: 98%  Weight: 183 lb 12.8 oz (83.4 kg)  Height: '5\' 10"'$  (1.778 m)    GEN- The patient is well appearing, alert and oriented x 3 today.   HEENT: normocephalic, atraumatic; sclera clear, conjunctiva pink; hearing intact; oropharynx clear; neck supple, no JVP Lymph- no cervical lymphadenopathy Lungs- Clear to ausculation bilaterally, normal work of breathing.  No wheezes, rales,  rhonchi Heart- Regular rate and rhythm, no murmurs, rubs or gallops, PMI not laterally displaced GI- soft, non-tender, non-distended, bowel sounds present, no hepatosplenomegaly Extremities- no clubbing, cyanosis, or edema; DP/PT/radial pulses 2+ bilaterally MS- no significant deformity or atrophy Skin- warm and dry, no rash or lesion Psych- euthymic mood, full affect Neuro- strength and sensation are intact  EKG is ordered. Personal review of EKG from today shows sinus bradycardia at 50 bpm  Additional studies reviewed include: Previous EP office notes.   Assessment and Plan:  NSVT Continue amiodarone 100 mg daily.  Surveillance labs today.   HTN Stable on current regimen   3. Chronic combined systolic and diastolic heart failure cMRI 06/2021 LVEF 42%, though echo 06/2021 with LVEF 45-50%. Discussed that his EF is borderline and he would likely benefit from further adjustment of GDMT. NYHA II symptoms Volume status stable on exam. Continue toprol.  Start Losartan 25 mg qhs.  Would then update Echo in 2-3 months.  Then would consider spiro, Entresto, Bidil, and or Jardiance pending repeat Echo.   Follow up with Dr. Quentin Ore in 6 months   Shirley Friar, PA-C  04/24/22 10:47 AM

## 2022-04-23 ENCOUNTER — Other Ambulatory Visit (HOSPITAL_COMMUNITY): Payer: Self-pay

## 2022-04-24 ENCOUNTER — Other Ambulatory Visit (HOSPITAL_COMMUNITY): Payer: Self-pay

## 2022-04-24 ENCOUNTER — Ambulatory Visit: Payer: 59 | Admitting: Student

## 2022-04-24 ENCOUNTER — Encounter: Payer: Self-pay | Admitting: Student

## 2022-04-24 VITALS — BP 124/86 | HR 50 | Ht 70.0 in | Wt 183.8 lb

## 2022-04-24 DIAGNOSIS — I5042 Chronic combined systolic (congestive) and diastolic (congestive) heart failure: Secondary | ICD-10-CM | POA: Diagnosis not present

## 2022-04-24 DIAGNOSIS — I4729 Other ventricular tachycardia: Secondary | ICD-10-CM

## 2022-04-24 DIAGNOSIS — I10 Essential (primary) hypertension: Secondary | ICD-10-CM

## 2022-04-24 MED ORDER — LOSARTAN POTASSIUM 25 MG PO TABS
25.0000 mg | ORAL_TABLET | Freq: Every day | ORAL | 3 refills | Status: DC
  Filled 2022-04-24: qty 90, 90d supply, fill #0
  Filled 2022-07-17: qty 90, 90d supply, fill #1
  Filled 2022-10-15: qty 90, 90d supply, fill #2
  Filled 2023-01-17: qty 90, 90d supply, fill #3

## 2022-04-24 NOTE — Patient Instructions (Signed)
Medication Instructions:  Your physician has recommended you make the following change in your medication:   START: Losartan '25mg'$  daily at bedtime  *If you need a refill on your cardiac medications before your next appointment, please call your pharmacy*   Lab Work: TODAY: CMET, TSH, FreeT4  If you have labs (blood work) drawn today and your tests are completely normal, you will receive your results only by: [place] (if you have MyChart) OR A paper copy in the mail If you have any lab test that is abnormal or we need to change your treatment, we will call you to review the results.   Follow-Up: At [facility], you and your health needs are our priority.  As part of our continuing mission to provide you with exceptional heart care, we have created designated Provider Care Teams.  These Care Teams include your primary Cardiologist (physician) and Advanced Practice Providers (APPs -  Physician Assistants and Nurse Practitioners) who all work together to provide you with the care you need, when you need it.  We recommend signing up for the patient portal called "MyChart".  Sign up information is provided on this After Visit Summary.  MyChart is used to connect with patients for Virtual Visits (Telemedicine).  Patients are able to view lab/test results, encounter notes, upcoming appointments, etc.  Non-urgent messages can be sent to your provider as well.   To learn more about what you can do with MyChart, go to [email or web address].    Your next appointment:   6 mth f/u with Dr. Quentin Ore 2-3 mth with Dr. Marlou Porch

## 2022-04-25 LAB — COMPREHENSIVE METABOLIC PANEL
ALT: 12 IU/L (ref 0–44)
AST: 22 IU/L (ref 0–40)
Albumin/Globulin Ratio: 1.6 (ref 1.2–2.2)
Albumin: 4.2 g/dL (ref 3.9–4.9)
Alkaline Phosphatase: 49 IU/L (ref 44–121)
BUN/Creatinine Ratio: 14 (ref 10–24)
BUN: 17 mg/dL (ref 8–27)
Bilirubin Total: <0.2 mg/dL (ref 0.0–1.2)
CO2: 20 mmol/L (ref 20–29)
Calcium: 9 mg/dL (ref 8.6–10.2)
Chloride: 108 mmol/L — ABNORMAL HIGH (ref 96–106)
Creatinine, Ser: 1.25 mg/dL (ref 0.76–1.27)
Globulin, Total: 2.7 g/dL (ref 1.5–4.5)
Glucose: 87 mg/dL (ref 70–99)
Potassium: 4.5 mmol/L (ref 3.5–5.2)
Sodium: 139 mmol/L (ref 134–144)
Total Protein: 6.9 g/dL (ref 6.0–8.5)
eGFR: 64 mL/min/{1.73_m2} (ref >59)

## 2022-04-25 LAB — TSH: TSH: 1.03 u[IU]/mL (ref 0.450–4.500)

## 2022-04-25 LAB — T4, FREE: Free T4: 1.23 ng/dL (ref 0.82–1.77)

## 2022-05-14 ENCOUNTER — Other Ambulatory Visit (HOSPITAL_COMMUNITY): Payer: Self-pay

## 2022-06-18 ENCOUNTER — Encounter: Payer: Self-pay | Admitting: Cardiology

## 2022-06-18 ENCOUNTER — Ambulatory Visit: Payer: 59 | Attending: Cardiology | Admitting: Cardiology

## 2022-06-18 VITALS — BP 124/80 | HR 46 | Ht 70.0 in | Wt 184.6 lb

## 2022-06-18 DIAGNOSIS — I5042 Chronic combined systolic (congestive) and diastolic (congestive) heart failure: Secondary | ICD-10-CM | POA: Diagnosis not present

## 2022-06-18 DIAGNOSIS — I2510 Atherosclerotic heart disease of native coronary artery without angina pectoris: Secondary | ICD-10-CM | POA: Diagnosis not present

## 2022-06-18 DIAGNOSIS — I4729 Other ventricular tachycardia: Secondary | ICD-10-CM

## 2022-06-18 DIAGNOSIS — I2583 Coronary atherosclerosis due to lipid rich plaque: Secondary | ICD-10-CM

## 2022-06-18 NOTE — Progress Notes (Signed)
Cardiology Office Note:    Date:  06/18/2022   ID:  Nicholas [name], DOB 1956/01/03, MRN [id number]  PCP:  Aurea Graff.Marlou Sa, MD   [place] [facility] Cardiologist:  Candee Furbish, MD Electrophysiologist:  Vickie Epley, MD     Referring MD: Aurea Graff.Marlou Sa, MD    History of Present Illness:    Nicholas [name] is a 66 y.o. male here for follow-up prior CABG 2003.  Seen by EP as well with amiodarone 100 mg daily used for nonsustained ventricular tachycardia.  Surveillance labs.  Also has come chronic combined systolic and diastolic heart failure with cardiac MRI showing 42% EF with most recent echo showing 45 to 50%.  He was started on losartan 25 mg.  NYHA class II-like symptoms.  Plan was to repeat echocardiogram in a few months.  Has a basal inferior aneurysm within the LV.  ICD was not recommended  09/21/2021 Zio HR 40 - 139 bpm, average 56 bpm. 1 SVT lasting 10 beats, likely atrial tachycardia. Rare supraventricular ectopy.  Occasional ventricular ectopy, 1.2%. No sustained arrhythmias.  Cath 2022: Left dominant coronary anatomy   Widely patent left main   Widely patent LAD but with total occlusion of a large first diagonal   Total occlusion of the distal circumflex which supplied the PDA.  There is total occlusion of the large early arising first obtuse marginal.   No selective engagement of the presumed nondominant right coronary was possible.  Faint opacification of a small conus branch could be seen.  This is the same situation noted in 2003 prior to coronary bypass surgery.   Left ventriculography by hand-injection reveals a large inferobasal aneurysm.  EF less than 50%.  LVEDP is normal.   Atretic LIMA to LAD   60% ostial narrowing of the saphenous vein graft to the first obtuse marginal   Widely patent saphenous vein graft to first diagonal   Recommendations Guideline directed therapy for systolic dysfunction. Further evaluation as needed relative to  ventricular arrhythmia per EP.  Past Medical History:  Diagnosis Date   Arthritis    knees, hands   Cataract    Coronary artery disease    Dysrhythmia 06/2014   sinus brady with PVC'S   GERD (gastroesophageal reflux disease)    Hepatitis C    Hx of adenomatous colonic polyps 09/21/2014   Hypercholesteremia    Hypertension    Myocardial infarction [facility])     Past Surgical History:  Procedure Laterality Date   CARDIAC CATHETERIZATION     CARDIAC SURGERY     triple bypass   COLONOSCOPY     CORONARY ARTERY BYPASS GRAFT     CABG x 3 Vessels   LEFT HEART CATH AND CORS/GRAFTS ANGIOGRAPHY N/A 06/19/2021   Procedure: LEFT HEART CATH AND CORS/GRAFTS ANGIOGRAPHY;  Surgeon: Belva Crome, MD;  Location: [place] [facility];  Service: Cardiovascular;  Laterality: N/A;    Current Medications: Current Meds  Medication Sig   alfuzosin (UROXATRAL) 10 MG 24 hr tablet Take 1 tablet (10 mg total) by mouth daily.   amiodarone (PACERONE) 100 MG tablet Take 1 tablet (100 mg total) by mouth daily.   aspirin EC 81 MG tablet Take 81 mg by mouth daily with breakfast.   docusate sodium (COLACE) 100 MG capsule Take 100 mg by mouth every evening.   losartan (COZAAR) 25 MG tablet Take 1 tablet (25 mg total) by mouth at bedtime.   metoprolol succinate (TOPROL XL) 25 MG 24 hr tablet  Take 1 tablet (25 mg total) by mouth daily.   nitroGLYCERIN (NITROSTAT) 0.4 MG SL tablet Place 1 tablet (0.4 mg total) under the tongue every 5 (five) minutes x 3 doses as needed for chest pain.   rosuvastatin (CRESTOR) 40 MG tablet Take 1 tablet (40 mg total) by mouth daily at 6 PM.     Allergies:   Patient has no known allergies.   Social History   Socioeconomic History   Marital status: Married    Spouse name: Not on file   Number of children: Not on file   Years of education: 11   Highest education level: 11th grade  Occupational History   Not on file  Tobacco Use   Smoking status: Former    Types: Cigarettes     Quit date: 06/10/2002    Years since quitting: 20.0   Smokeless tobacco: Never  Vaping Use   Vaping Use: Never used  Substance and Sexual Activity   Alcohol use: Yes    Alcohol/week: 7.0 standard drinks of alcohol    Types: 7 Cans of beer per week   Drug use: Yes    Frequency: 3.0 times per week    Types: Marijuana    Comment: occ   Sexual activity: Yes  Other Topics Concern   Not on file  Social History Narrative   Not on file   Social Determinants of Health   Financial Resource Strain: Not on file  Food Insecurity: Not on file  Transportation Needs: Not on file  Physical Activity: Not on file  Stress: Not on file  Social Connections: Not on file     Family History: The patient's family history includes Cancer in his mother; Diabetes in his mother; Diabetic kidney disease in his mother; Esophageal cancer in his mother; Hypertension in his father and mother; Kidney disease in his mother; Other in his father; Throat cancer in his mother. There is no history of Heart attack, Colon cancer, Stomach cancer, Rectal cancer, or Colon polyps.  ROS:   Please see the history of present illness.     All other systems reviewed and are negative.  EKGs/Labs/Other Studies Reviewed:    The following studies were reviewed today: As above   Recent Labs: 09/19/2021: Hemoglobin 14.2; Magnesium 2.0; Platelets 184 04/24/2022: ALT 12; BUN 17; Creatinine, Ser 1.25; Potassium 4.5; Sodium 139; TSH 1.030  Recent Lipid Panel    Component Value Date/Time   CHOL 116 06/11/2014 0100   TRIG 60 06/11/2014 0100   HDL 57 06/11/2014 0100   CHOLHDL 2.0 06/11/2014 0100   VLDL 12 06/11/2014 0100   LDLCALC 47 06/11/2014 0100     Risk Assessment/Calculations:              Physical Exam:    VS:  BP 124/80   Pulse (!) 46   Ht '5\' 10"'$  (1.778 m)   Wt 184 lb 9.6 oz (83.7 kg)   SpO2 97%   BMI 26.49 kg/m     Wt Readings from Last 3 Encounters:  06/18/22 184 lb 9.6 oz (83.7 kg)  04/24/22 183  lb 12.8 oz (83.4 kg)  10/05/21 179 lb 3.2 oz (81.3 kg)     GEN:  Well nourished, well developed in no acute distress HEENT: Normal NECK: No JVD; No carotid bruits LYMPHATICS: No lymphadenopathy CARDIAC: RRR, no murmurs, no rubs, gallops RESPIRATORY:  Clear to auscultation without rales, wheezing or rhonchi  ABDOMEN: Soft, non-tender, non-distended MUSCULOSKELETAL:  No edema; No deformity  SKIN:  Warm and dry NEUROLOGIC:  Alert and oriented x 3 PSYCHIATRIC:  Normal affect   ASSESSMENT:    1. Chronic combined systolic and diastolic CHF (congestive heart failure) ([place])   2. NSVT (nonsustained ventricular tachycardia) ([place])   3. Coronary artery disease due to lipid rich plaque    PLAN:    In order of problems listed above:  Nonsustained ventricular tachycardia - Continue with amiodarone low-dose 100 mg a day.  Thyroid function liver functions were normal.  Continue with medication monitoring. -Appreciate EP's assistance.  MRI of heart showed LV function 42% -Catheterization as above.  Medical management. -He is feeling much better with amiodarone.  Chronic systolic heart failure - Continue with metoprolol low-dose, losartan low-dose.  Challenging to uptitrate given blood pressure.  Creatinine has been in the 1.2-1.4 range.  Hyperlipidemia - On Crestor 40 mg a day.  LDL less than 70.  Hypertension - Well-controlled.  Coronary artery disease status post CABG - Catheterization as described above.  Continue with medical management.  No PCI post cath.  Symptoms are much improved.       Medication Adjustments/Labs and Tests Ordered: Current medicines are reviewed at length with the patient today.  Concerns regarding medicines are outlined above.  Orders Placed This Encounter  Procedures   ECHOCARDIOGRAM COMPLETE   No orders of the defined types were placed in this encounter.   Patient Instructions  Medication Instructions:  The current medical regimen is effective;   continue present plan and medications.  *If you need a refill on your cardiac medications before your next appointment, please call your pharmacy*  Testing/Procedures: Your physician has requested that you have an echocardiogram. Echocardiography is a painless test that uses sound waves to create images of your heart. It provides your doctor with information about the size and shape of your heart and how well your heart's chambers and valves are working. This procedure takes approximately one hour. There are no restrictions for this procedure.  Follow-Up: At [facility], you and your health needs are our priority.  As part of our continuing mission to provide you with exceptional heart care, we have created designated Provider Care Teams.  These Care Teams include your primary Cardiologist (physician) and Advanced Practice Providers (APPs -  Physician Assistants and Nurse Practitioners) who all work together to provide you with the care you need, when you need it.  We recommend signing up for the patient portal called "MyChart".  Sign up information is provided on this After Visit Summary.  MyChart is used to connect with patients for Virtual Visits (Telemedicine).  Patients are able to view lab/test results, encounter notes, upcoming appointments, etc.  Non-urgent messages can be sent to your provider as well.   To learn more about what you can do with MyChart, go to [email or web address].    Your next appointment:   6 month(s)  The format for your next appointment:   In Person  Provider:   Candee Furbish, MD     Important Information About Sugar         Signed, Candee Furbish, MD  06/18/2022 4:58 PM    [place]

## 2022-06-18 NOTE — Patient Instructions (Signed)
Medication Instructions:  The current medical regimen is effective;  continue present plan and medications.  *If you need a refill on your cardiac medications before your next appointment, please call your pharmacy*  Testing/Procedures: Your physician has requested that you have an echocardiogram. Echocardiography is a painless test that uses sound waves to create images of your heart. It provides your doctor with information about the size and shape of your heart and how well your heart's chambers and valves are working. This procedure takes approximately one hour. There are no restrictions for this procedure.  Follow-Up: At [facility], you and your health needs are our priority.  As part of our continuing mission to provide you with exceptional heart care, we have created designated Provider Care Teams.  These Care Teams include your primary Cardiologist (physician) and Advanced Practice Providers (APPs -  Physician Assistants and Nurse Practitioners) who all work together to provide you with the care you need, when you need it.  We recommend signing up for the patient portal called "MyChart".  Sign up information is provided on this After Visit Summary.  MyChart is used to connect with patients for Virtual Visits (Telemedicine).  Patients are able to view lab/test results, encounter notes, upcoming appointments, etc.  Non-urgent messages can be sent to your provider as well.   To learn more about what you can do with MyChart, go to [email or web address].    Your next appointment:   6 month(s)  The format for your next appointment:   In Person  Provider:   Candee Furbish, MD     Important Information About Sugar

## 2022-07-02 ENCOUNTER — Other Ambulatory Visit: Payer: Self-pay

## 2022-07-02 ENCOUNTER — Encounter (HOSPITAL_COMMUNITY): Payer: Self-pay | Admitting: *Deleted

## 2022-07-02 ENCOUNTER — Ambulatory Visit (HOSPITAL_COMMUNITY)
Admission: EM | Admit: 2022-07-02 | Discharge: 2022-07-02 | Disposition: A | Discharge status: Home / Self Care | Payer: 59 | Attending: Family Medicine | Admitting: Family Medicine

## 2022-07-02 DIAGNOSIS — M79672 Pain in left foot: Secondary | ICD-10-CM | POA: Diagnosis not present

## 2022-07-02 DIAGNOSIS — M7742 Metatarsalgia, left foot: Secondary | ICD-10-CM

## 2022-07-02 MED ORDER — DEXAMETHASONE SODIUM PHOSPHATE 10 MG/ML IJ SOLN
10.0000 mg | Freq: Once | INTRAMUSCULAR | Status: AC
  Administered 2022-07-02: 10 mg via INTRAMUSCULAR

## 2022-07-02 MED ORDER — KETOROLAC TROMETHAMINE 30 MG/ML IJ SOLN
INTRAMUSCULAR | Status: AC
  Filled 2022-07-02: qty 1

## 2022-07-02 MED ORDER — DEXAMETHASONE SODIUM PHOSPHATE 10 MG/ML IJ SOLN
INTRAMUSCULAR | Status: AC
  Filled 2022-07-02: qty 1

## 2022-07-02 MED ORDER — KETOROLAC TROMETHAMINE 30 MG/ML IJ SOLN
30.0000 mg | Freq: Once | INTRAMUSCULAR | Status: AC
  Administered 2022-07-02: 30 mg via INTRAMUSCULAR

## 2022-07-02 NOTE — Discharge Instructions (Signed)
Meds ordered this encounter  Medications   ketorolac (TORADOL) 30 MG/ML injection 30 mg   dexamethasone (DECADRON) injection 10 mg

## 2022-07-02 NOTE — ED Triage Notes (Signed)
PT has chronic Lt foot pain. Pt sees a foot DR. But could not get an appt today. Pt reports pain has increased. Pt reports over a year ago he had an injection in foot given by foot DR.

## 2022-07-03 ENCOUNTER — Telehealth: Payer: Self-pay | Admitting: Cardiology

## 2022-07-03 ENCOUNTER — Ambulatory Visit (HOSPITAL_COMMUNITY): Payer: 59 | Attending: Cardiology

## 2022-07-03 DIAGNOSIS — I5042 Chronic combined systolic (congestive) and diastolic (congestive) heart failure: Secondary | ICD-10-CM | POA: Diagnosis not present

## 2022-07-03 LAB — ECHOCARDIOGRAM COMPLETE
Area-P 1/2: 2.27 cm2
S' Lateral: 3.3 cm

## 2022-07-03 NOTE — Telephone Encounter (Signed)
New message   Patient had an echo today and came by check out asking for a call back regarding taking medications for gout.  He states that he was seen yesterday at urgent care and received 2 shots for "pain".  It is not clear if his diagnosis was gout or something else.  Patient want to make sure that he can take "gout" medication along with his heart medications.  Please advise.

## 2022-07-03 NOTE — Telephone Encounter (Signed)
Left message at pt's home number to c/b to discuss his concerns.

## 2022-07-04 NOTE — ED Provider Notes (Signed)
[facility]   [id number] 07/02/22 Arrival Time: 3762  ASSESSMENT & PLAN:  1. Left foot pain   2. Metatarsalgia of left foot    Acute on chronic.  Has seen podiatry for the same. No new injury. Given: Meds ordered this encounter  Medications   ketorolac (TORADOL) 30 MG/ML injection 30 mg   dexamethasone (DECADRON) injection 10 mg   Recommend:  Follow-up Information     Schedule an appointment as soon as possible for a visit  with [facility]).   Contact information: [address] [place],  [place]  [address]  [phone]               WBAT. Reviewed expectations re: course of current medical issues. Questions answered. Outlined signs and symptoms indicating need for more acute intervention. Patient verbalized understanding. After Visit Summary given.  SUBJECTIVE: History from: patient. Nicholas [name] is a 66 y.o. male who reports chronic left foot pain; distal/dorsal. Pt sees a foot DR. But could not get an appt today. Pt reports pain has increased. Pt reports over a year ago he had an injection in foot given by foot DR. No extremity sensation changes or weakness. Is ambulatory here.  Past Surgical History:  Procedure Laterality Date   CARDIAC CATHETERIZATION     CARDIAC SURGERY     triple bypass   COLONOSCOPY     CORONARY ARTERY BYPASS GRAFT     CABG x 3 Vessels   LEFT HEART CATH AND CORS/GRAFTS ANGIOGRAPHY N/A 06/19/2021   Procedure: LEFT HEART CATH AND CORS/GRAFTS ANGIOGRAPHY;  Surgeon: Belva Crome, MD;  Location: [place] [facility];  Service: Cardiovascular;  Laterality: N/A;      OBJECTIVE:  Vitals:   07/02/22 1339  BP: 110/75  Pulse: (!) 47  Resp: 18  Temp: 98.4 F (36.9 C)  SpO2: 98%    General appearance: alert; no distress HEENT: [place]; AT Neck: supple with FROM Resp: unlabored respirations Extremities: LLE: warm with well perfused appearance; poorly localized moderate tenderness over left  distal/dorsal foot; without gross deformities; swelling: none; bruising: none; toes with normal ROM CV: brisk extremity capillary refill of LLE; 2+ DP pulse of LLE. Skin: warm and dry; no visible rashes Neurologic: gait normal; normal sensation and strength of LLE Psychological: alert and cooperative; normal mood and affect   No Known Allergies  Past Medical History:  Diagnosis Date   Arthritis    knees, hands   Cataract    Coronary artery disease    Dysrhythmia 06/2014   sinus brady with PVC'S   GERD (gastroesophageal reflux disease)    Hepatitis C    Hx of adenomatous colonic polyps 09/21/2014   Hypercholesteremia    Hypertension    Myocardial infarction [facility])    Social History   Socioeconomic History   Marital status: Married    Spouse name: Not on file   Number of children: Not on file   Years of education: 11   Highest education level: 11th grade  Occupational History   Not on file  Tobacco Use   Smoking status: Former    Types: Cigarettes    Quit date: 06/10/2002    Years since quitting: 20.0   Smokeless tobacco: Never  Vaping Use   Vaping Use: Never used  Substance and Sexual Activity   Alcohol use: Yes    Alcohol/week: 7.0 standard drinks of alcohol    Types: 7 Cans of beer per week   Drug  use: Yes    Frequency: 3.0 times per week    Types: Marijuana    Comment: occ   Sexual activity: Yes  Other Topics Concern   Not on file  Social History Narrative   Not on file   Social Determinants of Health   Financial Resource Strain: Not on file  Food Insecurity: Not on file  Transportation Needs: Not on file  Physical Activity: Not on file  Stress: Not on file  Social Connections: Not on file   Family History  Problem Relation Age of Onset   Diabetic kidney disease Mother    Kidney disease Mother    Throat cancer Mother    Cancer Mother    Diabetes Mother    Hypertension Mother    Esophageal cancer Mother    Other Father        unknown    Hypertension Father    Heart attack Neg Hx    Colon cancer Neg Hx    Stomach cancer Neg Hx    Rectal cancer Neg Hx    Colon polyps Neg Hx    Past Surgical History:  Procedure Laterality Date   CARDIAC CATHETERIZATION     CARDIAC SURGERY     triple bypass   COLONOSCOPY     CORONARY ARTERY BYPASS GRAFT     CABG x 3 Vessels   LEFT HEART CATH AND CORS/GRAFTS ANGIOGRAPHY N/A 06/19/2021   Procedure: LEFT HEART CATH AND CORS/GRAFTS ANGIOGRAPHY;  Surgeon: Belva Crome, MD;  Location: [place] [facility];  Service: Cardiovascular;  Laterality: N/A;       Vanessa Kick, MD 07/04/22 1139

## 2022-07-05 NOTE — Telephone Encounter (Signed)
Pt returning call

## 2022-07-05 NOTE — Telephone Encounter (Signed)
Pt was calling to inform our office that he is having flare-up of gout, in his left foot.   He states he will be seeing his PCP in 2 weeks for further management of this.   Pt is asking if there are any gout medications that he should refrain from using, or have the MD refrain from using, from a cardiac standpoint and with cardiac meds.     He is unsure if his PCP will advise on a procedure/injection for this, but will keep [place] posted.  Pt has no new medications to have our office advise on at this time, he's just thinking and planning ahead for his upcoming visit PCP visit in 2 weeks.   Advised the pt when he see's his PCP for this and if new medications are ordered at that time, then he should reach out to our office and report these to [place],  so that we can safely advise from a cardiac/med perspective, if he's safe to proceed in taking or not.  Also advised him if his PCP wants to do any procedure/injection for his gout, then he should have them fax [place] a cardiac clearance note to [phone] ATTN: Dr. Monika Salk team.  Informed him that I will go ahead and send this message to Dr. Marlou Porch and [facility] RN, so that they have this to refer to when he calls them back in 2 weeks.  Did provide education to the pt that he should avoid any organ or red meats, for these are high in purines, which can increase your uric acid levels and spur a gout attack.  Pt verbalized understanding and agrees with this plan.    Off note:  Did endorse to him his echo results, per Dr. Marlou Porch (as indicate below):  The patient has been notified of the result and verbalized understanding.  All questions (if any) were answered.   Jerline Pain, MD  07/03/2022  4:23 PM EDT     Mildly improved pump function from prior echo.  Candee Furbish, MD

## 2022-07-06 ENCOUNTER — Other Ambulatory Visit: Payer: Self-pay | Admitting: Physician Assistant

## 2022-07-06 ENCOUNTER — Ambulatory Visit
Admission: RE | Admit: 2022-07-06 | Discharge: 2022-07-06 | Disposition: A | Discharge status: Home / Self Care | Payer: 59 | Source: Ambulatory Visit | Attending: Physician Assistant | Admitting: Physician Assistant

## 2022-07-06 ENCOUNTER — Other Ambulatory Visit (HOSPITAL_COMMUNITY): Payer: Self-pay

## 2022-07-06 DIAGNOSIS — M21612 Bunion of left foot: Secondary | ICD-10-CM | POA: Diagnosis not present

## 2022-07-06 DIAGNOSIS — I10 Essential (primary) hypertension: Secondary | ICD-10-CM | POA: Diagnosis not present

## 2022-07-06 DIAGNOSIS — I2510 Atherosclerotic heart disease of native coronary artery without angina pectoris: Secondary | ICD-10-CM | POA: Diagnosis not present

## 2022-07-06 DIAGNOSIS — M79675 Pain in left toe(s): Secondary | ICD-10-CM | POA: Diagnosis not present

## 2022-07-06 DIAGNOSIS — Z23 Encounter for immunization: Secondary | ICD-10-CM | POA: Diagnosis not present

## 2022-07-06 DIAGNOSIS — Z8619 Personal history of other infectious and parasitic diseases: Secondary | ICD-10-CM | POA: Diagnosis not present

## 2022-07-06 MED ORDER — TRAMADOL HCL 50 MG PO TABS
50.0000 mg | ORAL_TABLET | Freq: Two times a day (BID) | ORAL | 0 refills | Status: DC
  Filled 2022-07-06: qty 14, 7d supply, fill #0

## 2022-07-06 NOTE — Telephone Encounter (Signed)
Spoke with pt who was seen by PCP today.  PCP give him RX for tramadol.  Advised OK to take.  PCP is also "checking blood work" to see if he has gout. Results should be back by Monday.  Advised per Dr Marlou Porch - if he does have gout, ok to take colchicine. Pt was appreciative of the information and will call back if further questions/concerns.

## 2022-07-09 ENCOUNTER — Other Ambulatory Visit (HOSPITAL_COMMUNITY): Payer: Self-pay

## 2022-07-10 ENCOUNTER — Other Ambulatory Visit (HOSPITAL_COMMUNITY): Payer: Self-pay

## 2022-07-17 ENCOUNTER — Other Ambulatory Visit (HOSPITAL_COMMUNITY): Payer: Self-pay

## 2022-07-19 DIAGNOSIS — E7800 Pure hypercholesterolemia, unspecified: Secondary | ICD-10-CM | POA: Diagnosis not present

## 2022-07-19 DIAGNOSIS — M2550 Pain in unspecified joint: Secondary | ICD-10-CM | POA: Diagnosis not present

## 2022-07-19 DIAGNOSIS — I10 Essential (primary) hypertension: Secondary | ICD-10-CM | POA: Diagnosis not present

## 2022-07-19 DIAGNOSIS — Z125 Encounter for screening for malignant neoplasm of prostate: Secondary | ICD-10-CM | POA: Diagnosis not present

## 2022-07-19 DIAGNOSIS — Z0000 Encounter for general adult medical examination without abnormal findings: Secondary | ICD-10-CM | POA: Diagnosis not present

## 2022-07-19 DIAGNOSIS — I2510 Atherosclerotic heart disease of native coronary artery without angina pectoris: Secondary | ICD-10-CM | POA: Diagnosis not present

## 2022-07-19 DIAGNOSIS — Z23 Encounter for immunization: Secondary | ICD-10-CM | POA: Diagnosis not present

## 2022-07-19 DIAGNOSIS — N400 Enlarged prostate without lower urinary tract symptoms: Secondary | ICD-10-CM | POA: Diagnosis not present

## 2022-07-24 ENCOUNTER — Other Ambulatory Visit (HOSPITAL_COMMUNITY): Payer: Self-pay

## 2022-07-24 ENCOUNTER — Other Ambulatory Visit: Payer: Self-pay | Admitting: Cardiology

## 2022-07-24 MED ORDER — ROSUVASTATIN CALCIUM 40 MG PO TABS
40.0000 mg | ORAL_TABLET | Freq: Every day | ORAL | 3 refills | Status: DC
  Filled 2022-07-24: qty 90, 90d supply, fill #0
  Filled 2022-10-23: qty 90, 90d supply, fill #1
  Filled 2023-01-28: qty 90, 90d supply, fill #2
  Filled 2023-04-24: qty 90, 90d supply, fill #3

## 2022-07-26 ENCOUNTER — Encounter: Payer: Self-pay | Admitting: Podiatry

## 2022-07-26 ENCOUNTER — Ambulatory Visit: Payer: 59 | Admitting: Podiatry

## 2022-07-26 ENCOUNTER — Ambulatory Visit (INDEPENDENT_AMBULATORY_CARE_PROVIDER_SITE_OTHER): Payer: 59

## 2022-07-26 DIAGNOSIS — M2042 Other hammer toe(s) (acquired), left foot: Secondary | ICD-10-CM | POA: Diagnosis not present

## 2022-07-26 DIAGNOSIS — M21619 Bunion of unspecified foot: Secondary | ICD-10-CM | POA: Diagnosis not present

## 2022-07-26 DIAGNOSIS — M7752 Other enthesopathy of left foot: Secondary | ICD-10-CM | POA: Diagnosis not present

## 2022-07-26 DIAGNOSIS — M779 Enthesopathy, unspecified: Secondary | ICD-10-CM | POA: Diagnosis not present

## 2022-07-26 MED ORDER — TRIAMCINOLONE ACETONIDE 10 MG/ML IJ SUSP
10.0000 mg | Freq: Once | INTRAMUSCULAR | Status: AC
  Administered 2022-07-26: 10 mg

## 2022-07-27 NOTE — Progress Notes (Signed)
Subjective:   Patient ID: Nicholas [name], male   DOB: 66 y.o.   MRN: [id number]   HPI Patient presents stating that he has a lot of pain in his second digit of his left foot and its become very inflamed over the last couple months he has long-term history of bunion that is he is interested in getting corrected has had vascular issues but states he has had this checked in the relative recent past and has had it worked on and states he is in extreme pain   ROS      Objective:  Physical Exam  Vascular status shows pulses are intact there mildly weak but they are intact with good digital perfusion with patient noted to have a keratotic lesion with fluid buildup around the second digit lateral side pressing against the third that is very painful when pressed has significant structural bunion with pressure of the big toe against the second toe creating pain.     Assessment:  Inflammatory capsulitis with keratotic lesion digital deformity digits 2 and 3 left foot and structural bunion deformity left of a significant nature with possible low-grade vascular disease      Plan:  H&P x-rays reviewed conditions discussed at great length.  We may consider surgical intervention in this case but at this point I do think that we need to focus on his acute problem and I went ahead today I did sterile prep I did a careful inner phalangeal joint injection left 2 mg dexamethasone Kenalog 3 mg Xylocaine I debrided the area I applied padding to separate the toes and I then discussed the structural bunion and digital deformities and at one point may need to be corrected but I would like first this to get better and then also to discuss his vascular status in the more positive way.  Patient has not smoked for 20 years and tries to be active  X-rays indicate that there is structural bunion deformity left significant pressure between the digits no indication of osteolysis or osteoarthritis

## 2022-08-03 ENCOUNTER — Other Ambulatory Visit (HOSPITAL_COMMUNITY): Payer: Self-pay

## 2022-08-07 ENCOUNTER — Other Ambulatory Visit (HOSPITAL_COMMUNITY): Payer: Self-pay

## 2022-08-08 ENCOUNTER — Other Ambulatory Visit (HOSPITAL_COMMUNITY): Payer: Self-pay

## 2022-08-09 ENCOUNTER — Ambulatory Visit (INDEPENDENT_AMBULATORY_CARE_PROVIDER_SITE_OTHER): Payer: 59 | Admitting: Podiatry

## 2022-08-09 ENCOUNTER — Other Ambulatory Visit (HOSPITAL_COMMUNITY): Payer: Self-pay

## 2022-08-09 DIAGNOSIS — M21619 Bunion of unspecified foot: Secondary | ICD-10-CM

## 2022-08-09 DIAGNOSIS — M2042 Other hammer toe(s) (acquired), left foot: Secondary | ICD-10-CM

## 2022-08-09 DIAGNOSIS — I999 Unspecified disorder of circulatory system: Secondary | ICD-10-CM

## 2022-08-09 DIAGNOSIS — I2510 Atherosclerotic heart disease of native coronary artery without angina pectoris: Secondary | ICD-10-CM

## 2022-08-09 DIAGNOSIS — I2583 Coronary atherosclerosis due to lipid rich plaque: Secondary | ICD-10-CM

## 2022-08-09 MED ORDER — ALFUZOSIN HCL ER 10 MG PO TB24
10.0000 mg | ORAL_TABLET | Freq: Every day | ORAL | 0 refills | Status: DC
  Filled 2022-08-09: qty 90, 90d supply, fill #0

## 2022-08-09 NOTE — Progress Notes (Signed)
Subjective:   Patient ID: Nicholas [name], male   DOB: 66 y.o.   MRN: [id number]   HPI Patient states he is currently doing much better but he is motivated to have surgery after the first of the year as he has had nothing but problems with these toes.  Patient states today it is better   ROS      Objective:  Physical Exam  Neurovascular status unchanged with patient having palpable pulses but history of vascular disease so I do want to get this checked with structural bunion deformity left and digital deformity second and third digits left over right foot.  I did note good digital perfusion     Assessment:  Structural deformity with hammertoe deformity second and third structural bunion with pressure of the big toe against the second toe      Plan:  H&P condition reviewed and at this point I have recommended at 1 point forefoot correction with bunion correction possible [place] and arthroplasty digits 2 and 3.  He wants to wait till after the first the year with fine with and I am just get a go ahead and order vascular study to confirm adequate circulatory status.  Patient will get this done I will see him back prior to surgery and I did educate him on the procedures that will be necessary.  Continue using padding and wider shoe gear at this time

## 2022-08-21 ENCOUNTER — Other Ambulatory Visit (HOSPITAL_COMMUNITY): Payer: Self-pay

## 2022-09-14 ENCOUNTER — Ambulatory Visit (INDEPENDENT_AMBULATORY_CARE_PROVIDER_SITE_OTHER): Payer: 59 | Admitting: Podiatry

## 2022-09-14 ENCOUNTER — Encounter: Payer: Self-pay | Admitting: Podiatry

## 2022-09-14 DIAGNOSIS — M7752 Other enthesopathy of left foot: Secondary | ICD-10-CM

## 2022-09-14 MED ORDER — TRIAMCINOLONE ACETONIDE 10 MG/ML IJ SUSP
10.0000 mg | Freq: Once | INTRAMUSCULAR | Status: AC
  Administered 2022-09-14: 10 mg

## 2022-09-16 NOTE — Progress Notes (Signed)
Subjective:   Patient ID: Nicholas [name], male   DOB: 66 y.o.   MRN: [id number]   HPI Patient presents stating that he has pain between the second and third toes he knows he is getting need surgery he did not get this circulatory studies that we wanted done but his pulses seem to be okay when questions currently he does not have any claudication symptoms   ROS      Objective:  Physical Exam  Inflammatory capsulitis of the inner phalangeal joint digit 2 and digit 3 left painful when pressed with keratotic lesions and fluid buildup     Assessment:  Inflammatory capsulitis second and third digits left with keratotic tissue formation     Plan:  H&P reviewed condition sterile prep injected the inner phalangeal joint 2 mg dexamethasone 5 mg Xylocaine and debrided lesions applied cushioning discussed arthroplasty which still at 1 point will need to be done in the future

## 2022-10-04 ENCOUNTER — Other Ambulatory Visit (HOSPITAL_COMMUNITY): Payer: Self-pay

## 2022-10-04 ENCOUNTER — Other Ambulatory Visit: Payer: Self-pay | Admitting: Cardiology

## 2022-10-04 MED ORDER — METOPROLOL SUCCINATE ER 25 MG PO TB24
25.0000 mg | ORAL_TABLET | Freq: Every day | ORAL | 3 refills | Status: DC
  Filled 2022-10-04: qty 90, 90d supply, fill #0
  Filled 2023-01-08: qty 90, 90d supply, fill #1
  Filled 2023-04-16: qty 90, 90d supply, fill #2
  Filled 2023-07-19: qty 90, 90d supply, fill #3

## 2022-10-04 MED ORDER — METOPROLOL SUCCINATE ER 25 MG PO TB24
25.0000 mg | ORAL_TABLET | Freq: Every day | ORAL | 3 refills | Status: DC
  Filled 2022-10-04: qty 90, 90d supply, fill #0

## 2022-10-05 ENCOUNTER — Other Ambulatory Visit (HOSPITAL_COMMUNITY): Payer: Self-pay

## 2022-10-08 DIAGNOSIS — I219 Acute myocardial infarction, unspecified: Secondary | ICD-10-CM

## 2022-10-08 HISTORY — DX: Acute myocardial infarction, unspecified: I21.9

## 2022-10-19 ENCOUNTER — Encounter: Payer: Self-pay | Admitting: Podiatry

## 2022-10-19 ENCOUNTER — Ambulatory Visit (INDEPENDENT_AMBULATORY_CARE_PROVIDER_SITE_OTHER): Payer: Commercial Managed Care - PPO | Admitting: Podiatry

## 2022-10-19 DIAGNOSIS — M7752 Other enthesopathy of left foot: Secondary | ICD-10-CM | POA: Diagnosis not present

## 2022-10-24 NOTE — Progress Notes (Signed)
Subjective:   Patient ID: Nicholas [name], male   DOB: 67 y.o.   MRN: [id number]   HPI Patient presents stating he know he needs surgery but he needs to hold off for a couple more months but is very willing to get it done but just needs a short period of increased relief   ROS      Objective:  Physical Exam  Neuro vascular status intact with inflammation pain between the hallux and second digit left with chronic lesion formation painful when pressed between the second and third and the big toe and second toe left     Assessment:  Structural bunion deformity hammertoe deformity chronic pain and patient actually does have good circulatory status noted at this time     Plan:  Reviewed condition at great length and that I absolutely cannot keep doing this I would do 1 more small injection just to give him short-term relief but he is getting need to have surgery.  Patient understands this wants to do this just needs to hold off for couple more months and I did careful injection of the inner phalangeal joint between digits 2 and 3 left I debrided lesions applied cushioning reappoint to recheck

## 2022-10-30 ENCOUNTER — Other Ambulatory Visit: Payer: Self-pay | Admitting: Cardiology

## 2022-10-31 ENCOUNTER — Other Ambulatory Visit (HOSPITAL_COMMUNITY): Payer: Self-pay

## 2022-10-31 MED ORDER — AMIODARONE HCL 100 MG PO TABS
100.0000 mg | ORAL_TABLET | Freq: Every day | ORAL | 2 refills | Status: DC
  Filled 2022-10-31: qty 90, 90d supply, fill #0
  Filled 2023-01-28: qty 90, 90d supply, fill #1
  Filled 2023-04-30: qty 90, 90d supply, fill #2

## 2022-11-01 ENCOUNTER — Other Ambulatory Visit (HOSPITAL_COMMUNITY): Payer: Self-pay

## 2022-11-02 ENCOUNTER — Other Ambulatory Visit (HOSPITAL_COMMUNITY): Payer: Self-pay

## 2022-11-02 IMAGING — MR MR CARD MORPHOLOGY WO/W CM
45 of 48 series · 45 of 48 positions shown · IV contrast (Contrast agent)
Comparison: none

CLINICAL DATA: Clinical question of sarcoidosis and ventricular
tachycardia
Study assumes HCT of 35 and BSA of

EXAM:
CARDIAC MRI
TECHNIQUE: The patient was scanned on a 1.5 Tesla GE magnet. A dedicated
cardiac coil was used. Functional imaging was done using Fiesta
sequences. [DATE], and 4 chamber views were done to assess for RWMA's.
Modified Kambishi rule using a short axis stack was used to
calculate an ejection fraction on a dedicated work station using
Circle software. The patient received 7 cc of Gadavist. After 10
minutes inversion recovery sequences were used to assess for
infiltration and scar tissue.
CONTRAST:  7 cc  of Gadavist

[Series 4: t2_haste_db_tra_bh · axial · 8.0mm · 1.41mm/px · 1 of 20 slices shown]
[im 1/20]
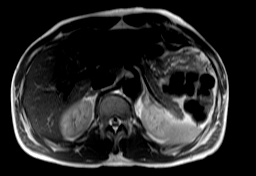

[Series 8: bSSFP · oblique · 8.0mm · 1.61mm/px · 1 of 25 slices shown (1 of 20)]
[im 1/25]
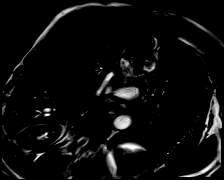

[Series 9: bSSFP · oblique · 8.0mm · 1.61mm/px · 1 of 25 slices shown (2 of 20)]
[im 1/25]
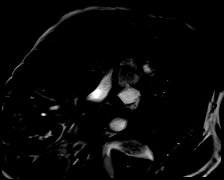

[Series 10: bSSFP · oblique · 8.0mm · 1.61mm/px · 1 of 25 slices shown (3 of 20)]
[im 1/25]
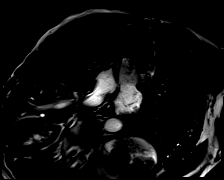

[Series 11: bSSFP · oblique · 8.0mm · 1.61mm/px · 1 of 25 slices shown (4 of 20)]
[im 1/25]
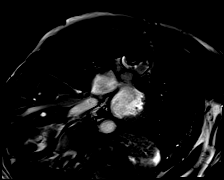

[Series 12: bSSFP · oblique · 8.0mm · 1.61mm/px · 1 of 25 slices shown (5 of 20)]
[im 1/25]
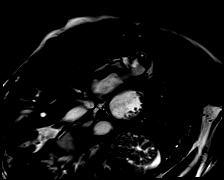

[Series 13: bSSFP · oblique · 8.0mm · 1.61mm/px · 1 of 25 slices shown (6 of 20)]
[im 1/25]
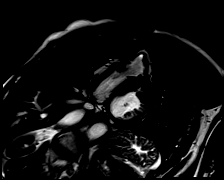

[Series 14: bSSFP · oblique · 8.0mm · 1.61mm/px · 1 of 25 slices shown (7 of 20)]
[im 1/25]
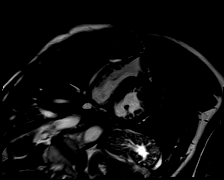

[Series 15: bSSFP · oblique · 8.0mm · 1.61mm/px · 1 of 25 slices shown (8 of 20)]
[im 1/25]
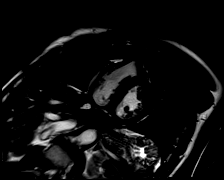

[Series 16: bSSFP · oblique · 8.0mm · 1.61mm/px · 1 of 25 slices shown (9 of 20)]
[im 1/25]
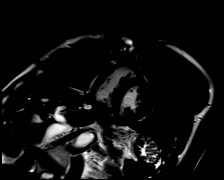

[Series 17: bSSFP · oblique · 8.0mm · 1.61mm/px · 1 of 25 slices shown (10 of 20)]
[im 1/25]
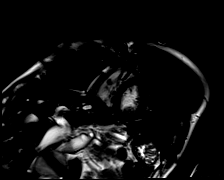

[Series 18: bSSFP · oblique · 8.0mm · 1.61mm/px · 1 of 25 slices shown (11 of 20)]
[im 1/25]
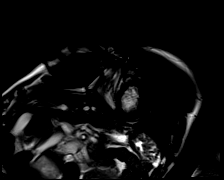

[Series 19: bSSFP · oblique · 8.0mm · 1.61mm/px · 1 of 25 slices shown (12 of 20)]
[im 1/25]
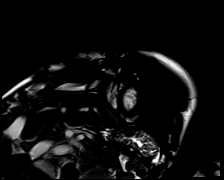

[Series 20: bSSFP · oblique · 8.0mm · 1.61mm/px · 1 of 25 slices shown (13 of 20)]
[im 1/25]
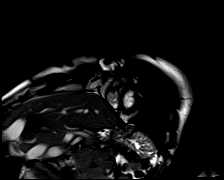

[Series 21: bSSFP · oblique · 8.0mm · 1.61mm/px · 1 of 25 slices shown (14 of 20)]
[im 1/25]
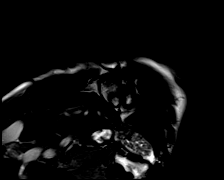

[Series 22: bSSFP · oblique · 8.0mm · 1.61mm/px · 1 of 25 slices shown (15 of 20)]
[im 1/25]
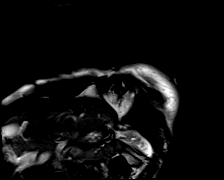

[Series 23: bSSFP · oblique · 8.0mm · 1.61mm/px · 1 of 25 slices shown (16 of 20)]
[im 1/25]
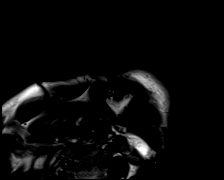

[Series 24: bSSFP · sagittal · 6.0mm · 1.41mm/px · 1 of 25 slices shown (17 of 20)]
[im 1/25]
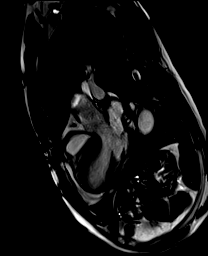

[Series 25: bSSFP · axial · 6.0mm · 1.41mm/px · 1 of 25 slices shown (18 of 20)]
[im 1/25]
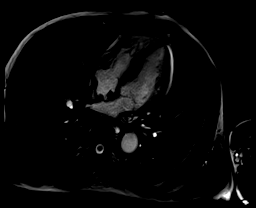

[Series 26: bSSFP · sagittal · 6.0mm · 1.41mm/px · 1 of 25 slices shown (19 of 20)]
[im 1/25]
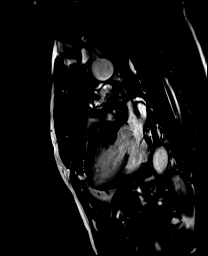

[Series 27: (id)_long_t1 · oblique · 8.0mm · 1.56mm/px · 1 of 24 slices shown]
[im 1/24]
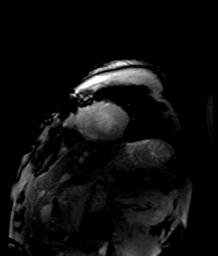

[Series 28: (id)_long_t1_moco · oblique · 8.0mm · 1.56mm/px · 1 of 24 slices shown]
[im 1/24]
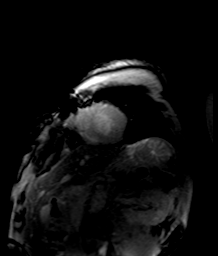

[Series 31: (id)_trufi · oblique · 8.0mm · 2.08mm/px · 1 of 9 slices shown]
[im 1/9]
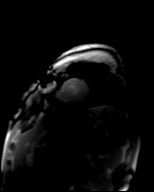

[Series 32: (id)_trufi_moco · oblique · 8.0mm · 2.08mm/px · 1 of 9 slices shown]
[im 1/9]
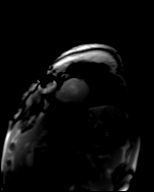

[Series 35: pre short axis · oblique · non-contrast · 8.0mm · 2.25mm/px · 1 of 7 slices shown (1 of 6)]
[im 1/7]
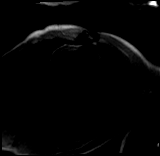

[Series 36: pre short axis · oblique · non-contrast · 8.0mm · 2.25mm/px · 1 of 10 slices shown (2 of 6)]
[im 1/10]
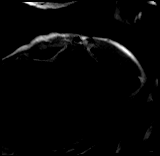

[Series 37: pre short axis · oblique · non-contrast · 8.0mm · 2.25mm/px · 1 of 10 slices shown (3 of 6)]
[im 1/10]
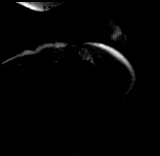

[Series 38: pre short axis · oblique · non-contrast · 8.0mm · 2.25mm/px · 1 of 3 slices shown (4 of 6)]
[im 1/3]
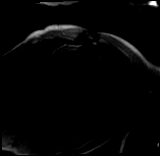

[Series 39: pre short axis · oblique · non-contrast · 8.0mm · 2.25mm/px · 1 of 10 slices shown (5 of 6)]
[im 1/10]
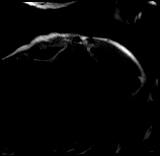

[Series 40: pre short axis · oblique · non-contrast · 8.0mm · 2.25mm/px · 1 of 10 slices shown (6 of 6)]
[im 1/10]
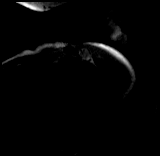

[Series 41: rest short axis · oblique · 8.0mm · 2.25mm/px · 1 of 57 slices shown (1 of 6)]
[im 1/57]
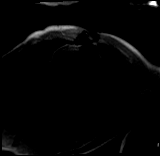

[Series 42: rest short axis · oblique · 8.0mm · 2.25mm/px · 1 of 60 slices shown (2 of 6)]
[im 1/60]
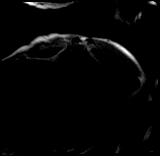

[Series 43: rest short axis · oblique · 8.0mm · 2.25mm/px · 1 of 60 slices shown (3 of 6)]
[im 1/60]
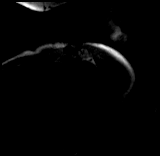

[Series 44: rest short axis · oblique · 8.0mm · 2.25mm/px · 1 of 57 slices shown (4 of 6)]
[im 1/57]
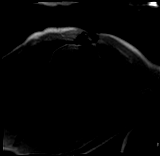

[Series 45: rest short axis · oblique · 8.0mm · 2.25mm/px · 1 of 60 slices shown (5 of 6)]
[im 1/60]
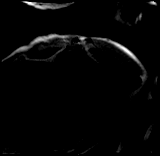

[Series 46: rest short axis · oblique · 8.0mm · 2.25mm/px · 1 of 60 slices shown (6 of 6)]
[im 1/60]
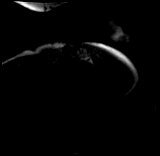

[Series 47: cine rvot · sagittal · 6.0mm · 1.41mm/px · 1 of 25 slices shown]
[im 1/25]
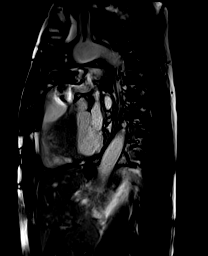

[Series 48: bSSFP · coronal · 6.0mm · 1.41mm/px · 1 of 25 slices shown (20 of 20)]
[im 1/25]
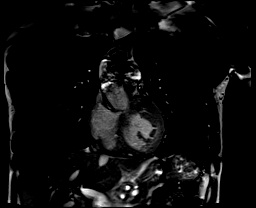

[Series 49: aortic valve cine · axial · 6.0mm · 1.41mm/px · 1 of 25 slices shown]
[im 1/25]
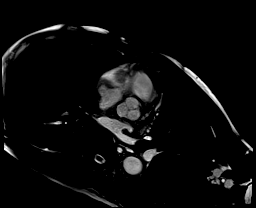

[Series 50: cine rvit · oblique · 6.0mm · 1.41mm/px · 1 of 25 slices shown]
[im 1/25]
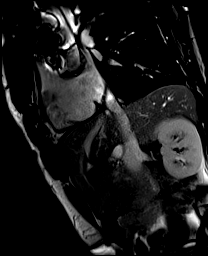

[Series 51: aortic valve flow_200_tp_retro_bh · axial · 6.0mm · 1.73mm/px · 1 of 30 slices shown]
[im 1/30]
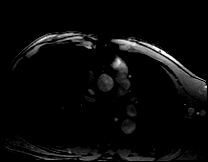

[Series 52: aortic valve flow_200_tp_retro_bh_mag · axial · 6.0mm · 1.73mm/px · 1 of 29 slices shown]
[im 1/29]
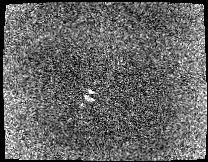

[Series 53: aortic valve flow_200_tp_retro_bh_p · axial · 6.0mm · 1.73mm/px · 1 of 30 slices shown]
[im 1/30]
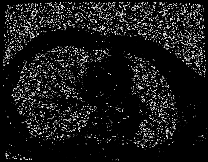

[Series 54: pulmonary valve flow_150_tp_retro_bh · axial · 6.0mm · 1.73mm/px · 1 of 30 slices shown]
[im 1/30]
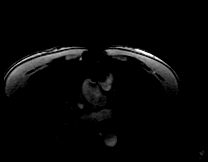

[Series 55: pulmonary valve flow_150_tp_retro_bh_mag · axial · 6.0mm · 1.73mm/px · 1 of 30 slices shown]
[im 1/30]
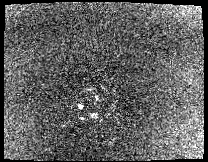

[45 of 48 positions shown; findings below may reference images not displayed]

FINDINGS: 1. Normal left ventricular size, with LVEDD 43 mm, and LVEDVi 67
mL/m2.

Normal left ventricular thickness, with intraventricular septal
thickness of 8 mm, posterior wall thickness of 9 mm, and septal to
posterior ratio < 1.5.

Myocardial mass index normal at 70 g/m2.

Mildly reduced left ventricular systolic function (LVEF =42%). There
is inferior and inferoseptal dyskinesis.

Left ventricular parametric mapping notable for significant elevated
in the ECV signal in the areas of dyskinesis (basal inferior 71%
with increase inferolateral ECV of 49% base and 44% mid) and normal
T2 signal.

There is late gadolinium enhancement in the left ventricular
myocardium. Transmural in the inferoseptal and inferior base with
associated thinning, there is up to 50% subendocardial LGE in the
inferolateral based to mid.

2. Normal right ventricular size with RVEDVI 80 mL/m2.

Normal right ventricular thickness.

Moderate right ventricular systolic dysfunction (RVEF =32%).
Abnormal basal RV function without aneurysm.

3.  Normal left and right atrial size.

4. Normal size of the aortic root, ascending aorta and pulmonary
artery.

5.  Valve disease as below.

Tri-leaflet aortic valve. Not suggestive of significant aortic
stenosis, mild to moderate regurgitation by regurgitation fraction
(16%), though this may be overestimated in the setting of notable
artifact.

Pulmonary valve not suggestive of significant stenosis or
regurgitation

Tricuspid valve with qualitatively mild regurgitation.

Mitral valve is central and mild based on LV to RV comparison (given
flow related artifact).

6.  Normal pericardium.  No pericardial effusion.

7. Grossly, no extracardiac findings. Recommended dedicated study if
concerned for non-cardiac pathology.

8. Notable PVCs during procedure decreases accuracy of volumetric
assessment and valve quantification. There is artifact related to
sternotomy wires. Breath hold artifact during flow acquisitions.
IMPRESSION: LGE and myocardial thinning with normal T2 signal: Lack of edema and
subendocardial pattern are more consistent with ischemic disease
than sarcoidosis.

Functional assessment limited in the setting of breath hold artifact
and PVCs.

## 2022-11-07 ENCOUNTER — Other Ambulatory Visit (HOSPITAL_COMMUNITY): Payer: Self-pay

## 2022-11-07 MED ORDER — ALFUZOSIN HCL ER 10 MG PO TB24
10.0000 mg | ORAL_TABLET | Freq: Every day | ORAL | 0 refills | Status: DC
  Filled 2022-11-07: qty 90, 90d supply, fill #0

## 2022-11-08 ENCOUNTER — Other Ambulatory Visit (HOSPITAL_COMMUNITY): Payer: Self-pay

## 2022-12-19 ENCOUNTER — Encounter: Payer: Self-pay | Admitting: Cardiology

## 2022-12-19 ENCOUNTER — Ambulatory Visit: Payer: Commercial Managed Care - PPO | Attending: Cardiology | Admitting: Cardiology

## 2022-12-19 VITALS — BP 143/80 | HR 53 | Ht 70.0 in | Wt 185.0 lb

## 2022-12-19 DIAGNOSIS — I2583 Coronary atherosclerosis due to lipid rich plaque: Secondary | ICD-10-CM | POA: Diagnosis not present

## 2022-12-19 DIAGNOSIS — I2510 Atherosclerotic heart disease of native coronary artery without angina pectoris: Secondary | ICD-10-CM | POA: Diagnosis not present

## 2022-12-19 DIAGNOSIS — I4729 Other ventricular tachycardia: Secondary | ICD-10-CM | POA: Diagnosis not present

## 2022-12-19 DIAGNOSIS — I5042 Chronic combined systolic (congestive) and diastolic (congestive) heart failure: Secondary | ICD-10-CM

## 2022-12-19 NOTE — Patient Instructions (Signed)
Medication Instructions:   *If you need a refill on your cardiac medications before your next appointment, please call your pharmacy*   Lab Work:  If you have labs (blood work) drawn today and your tests are completely normal, you will receive your results only by: [place] (if you have MyChart) OR A paper copy in the mail If you have any lab test that is abnormal or we need to change your treatment, we will call you to review the results.   Testing/Procedures:    Follow-Up: At [facility], you and your health needs are our priority.  As part of our continuing mission to provide you with exceptional heart care, we have created designated Provider Care Teams.  These Care Teams include your primary Cardiologist (physician) and Advanced Practice Providers (APPs -  Physician Assistants and Nurse Practitioners) who all work together to provide you with the care you need, when you need it.  We recommend signing up for the patient portal called "MyChart".  Sign up information is provided on this After Visit Summary.  MyChart is used to connect with patients for Virtual Visits (Telemedicine).  Patients are able to view lab/test results, encounter notes, upcoming appointments, etc.  Non-urgent messages can be sent to your provider as well.   To learn more about what you can do with MyChart, go to [email or web address].    Your next appointment:   6 month(s)  Provider:   Candee Furbish, MD     Other Instructions

## 2022-12-19 NOTE — Progress Notes (Signed)
Cardiology Office Note:    Date:  12/19/2022   ID:  Nicholas [name], DOB 08-Nov-1955, MRN [id number]  PCP:  Aurea Graff.Marlou Sa, MD   [facility] Cardiologist:  Candee Furbish, MD Electrophysiologist:  Vickie Epley, MD     Referring MD: Aurea Graff.Marlou Sa, MD    History of Present Illness:    Nicholas [name] is a 67 y.o. male here for follow-up prior CABG 2003.  Seen by EP as well with amiodarone 100 mg daily used for nonsustained ventricular tachycardia.  Surveillance labs.  Also has come chronic combined systolic and diastolic heart failure with cardiac MRI showing 42% EF with most recent echo showing 45 to 50%.  He was started on losartan 25 mg.  NYHA class II-like symptoms.  Plan was to repeat echocardiogram in a few months.  Has a basal inferior aneurysm within the LV.  ICD was not recommended  09/21/2021 Zio HR 40 - 139 bpm, average 56 bpm. 1 SVT lasting 10 beats, likely atrial tachycardia. Rare supraventricular ectopy.  Occasional ventricular ectopy, 1.2%. No sustained arrhythmias.  Cath 2022: Left dominant coronary anatomy   Widely patent left main   Widely patent LAD but with total occlusion of a large first diagonal   Total occlusion of the distal circumflex which supplied the PDA.  There is total occlusion of the large early arising first obtuse marginal.   No selective engagement of the presumed nondominant right coronary was possible.  Faint opacification of a small conus branch could be seen.  This is the same situation noted in 2003 prior to coronary bypass surgery.   Left ventriculography by hand-injection reveals a large inferobasal aneurysm.  EF less than 50%.  LVEDP is normal.   Atretic LIMA to LAD   60% ostial narrowing of the saphenous vein graft to the first obtuse marginal   Widely patent saphenous vein graft to first diagonal   Recommendations Guideline directed therapy for systolic dysfunction. Further evaluation as needed relative to  ventricular arrhythmia per EP.  Enjoyed a week in the [place] with family.  No fevers chills nausea vomiting syncope bleeding.  Past Medical History:  Diagnosis Date   Arthritis    knees, hands   Cataract    Coronary artery disease    Dysrhythmia 06/2014   sinus brady with PVC'S   GERD (gastroesophageal reflux disease)    Hepatitis C    Hx of adenomatous colonic polyps 09/21/2014   Hypercholesteremia    Hypertension    Myocardial infarction [facility])     Past Surgical History:  Procedure Laterality Date   CARDIAC CATHETERIZATION     CARDIAC SURGERY     triple bypass   COLONOSCOPY     CORONARY ARTERY BYPASS GRAFT     CABG x 3 Vessels   LEFT HEART CATH AND CORS/GRAFTS ANGIOGRAPHY N/A 06/19/2021   Procedure: LEFT HEART CATH AND CORS/GRAFTS ANGIOGRAPHY;  Surgeon: Belva Crome, MD;  Location: [facility] [place] [facility];  Service: Cardiovascular;  Laterality: N/A;    Current Medications: Current Meds  Medication Sig   alfuzosin (UROXATRAL) 10 MG 24 hr tablet Take 1 tablet (10 mg total) by mouth daily.   amiodarone (PACERONE) 100 MG tablet Take 1 tablet (100 mg total) by mouth daily.   aspirin EC 81 MG tablet Take 81 mg by mouth daily with breakfast.   docusate sodium (COLACE) 100 MG capsule Take 100 mg by mouth every evening.   losartan (COZAAR) 25 MG tablet Take 1 tablet (25  mg total) by mouth at bedtime.   metoprolol succinate (TOPROL XL) 25 MG 24 hr tablet Take 1 tablet (25 mg total) by mouth daily.   nitroGLYCERIN (NITROSTAT) 0.4 MG SL tablet Place 1 tablet (0.4 mg total) under the tongue every 5 (five) minutes x 3 doses as needed for chest pain.   rosuvastatin (CRESTOR) 40 MG tablet Take 1 tablet (40 mg total) by mouth daily at 6 PM.     Allergies:   Patient has no known allergies.   Social History   Socioeconomic History   Marital status: Married    Spouse name: Not on file   Number of children: Not on file   Years of education: 11   Highest education level: 11th grade   Occupational History   Not on file  Tobacco Use   Smoking status: Former    Types: Cigarettes    Quit date: 06/10/2002    Years since quitting: 20.5   Smokeless tobacco: Never  Vaping Use   Vaping Use: Never used  Substance and Sexual Activity   Alcohol use: Yes    Alcohol/week: 7.0 standard drinks of alcohol    Types: 7 Cans of beer per week   Drug use: Yes    Frequency: 3.0 times per week    Types: Marijuana    Comment: occ   Sexual activity: Yes  Other Topics Concern   Not on file  Social History Narrative   Not on file   Social Determinants of Health   Financial Resource Strain: Not on file  Food Insecurity: Not on file  Transportation Needs: Not on file  Physical Activity: Not on file  Stress: Not on file  Social Connections: Not on file     Family History: The patient's family history includes Cancer in his mother; Diabetes in his mother; Diabetic kidney disease in his mother; Esophageal cancer in his mother; Hypertension in his father and mother; Kidney disease in his mother; Other in his father; Throat cancer in his mother. There is no history of Heart attack, Colon cancer, Stomach cancer, Rectal cancer, or Colon polyps.  ROS:   Please see the history of present illness.     All other systems reviewed and are negative.  EKGs/Labs/Other Studies Reviewed:    The following studies were reviewed today: As above   Recent Labs: 04/24/2022: ALT 12; BUN 17; Creatinine, Ser 1.25; Potassium 4.5; Sodium 139; TSH 1.030  Recent Lipid Panel    Component Value Date/Time   CHOL 116 06/11/2014 0100   TRIG 60 06/11/2014 0100   HDL 57 06/11/2014 0100   CHOLHDL 2.0 06/11/2014 0100   VLDL 12 06/11/2014 0100   LDLCALC 47 06/11/2014 0100     Risk Assessment/Calculations:              Physical Exam:    VS:  BP (!) 143/80 (BP Location: Left Arm, Patient Position: Sitting, Cuff Size: Normal)   Pulse (!) 53   Ht '5\' 10"'$  (1.778 m)   Wt 185 lb (83.9 kg)   SpO2 97%    BMI 26.54 kg/m     Wt Readings from Last 3 Encounters:  12/19/22 185 lb (83.9 kg)  06/18/22 184 lb 9.6 oz (83.7 kg)  04/24/22 183 lb 12.8 oz (83.4 kg)     GEN:  Well nourished, well developed in no acute distress HEENT: Normal NECK: No JVD; No carotid bruits LYMPHATICS: No lymphadenopathy CARDIAC: RRR, no murmurs, no rubs, gallops RESPIRATORY:  Clear to auscultation without  rales, wheezing or rhonchi  ABDOMEN: Soft, non-tender, non-distended MUSCULOSKELETAL:  No edema; No deformity  SKIN: Warm and dry NEUROLOGIC:  Alert and oriented x 3 PSYCHIATRIC:  Normal affect   ASSESSMENT:    1. Chronic combined systolic and diastolic CHF (congestive heart failure) ([place])   2. NSVT (nonsustained ventricular tachycardia) ([place])   3. Coronary artery disease due to lipid rich plaque     PLAN:    In order of problems listed above:  Nonsustained ventricular tachycardia - Continue with amiodarone low-dose 100 mg a day.  Thyroid function liver functions were normal.  Continue with medication monitoring.  Has blood work coming up with Dr. Alroy Dust in April 2024. -Appreciate EP's assistance.  MRI of heart showed LV function 42% -Catheterization as above.  Medical management. -He is feeling much better with amiodarone.  Very rare skips.  No sustained tachycardias    Chronic systolic heart failure - Continue with metoprolol low-dose, losartan low-dose.  Challenging to uptitrate given blood pressure.  Creatinine has been in the 1.2-1.5 range.  Hyperlipidemia - On Crestor 40 mg a day.  LDL less than 70.  Last LDL 71.  Hypertension - Well-controlled.  Doing very well.  Coronary artery disease status post CABG - Catheterization as described above.  Continue with medical management.  No PCI post cath.  Symptoms are much improved.  Excellent.       Medication Adjustments/Labs and Tests Ordered: Current medicines are reviewed at length with the patient today.  Concerns regarding medicines  are outlined above.  No orders of the defined types were placed in this encounter.  No orders of the defined types were placed in this encounter.   Patient Instructions  Medication Instructions:   *If you need a refill on your cardiac medications before your next appointment, please call your pharmacy*   Lab Work:  If you have labs (blood work) drawn today and your tests are completely normal, you will receive your results only by: [place] (if you have MyChart) OR A paper copy in the mail If you have any lab test that is abnormal or we need to change your treatment, we will call you to review the results.   Testing/Procedures:    Follow-Up: At [facility], you and your health needs are our priority.  As part of our continuing mission to provide you with exceptional heart care, we have created designated Provider Care Teams.  These Care Teams include your primary Cardiologist (physician) and Advanced Practice Providers (APPs -  Physician Assistants and Nurse Practitioners) who all work together to provide you with the care you need, when you need it.  We recommend signing up for the patient portal called "MyChart".  Sign up information is provided on this After Visit Summary.  MyChart is used to connect with patients for Virtual Visits (Telemedicine).  Patients are able to view lab/test results, encounter notes, upcoming appointments, etc.  Non-urgent messages can be sent to your provider as well.   To learn more about what you can do with MyChart, go to [email or web address].    Your next appointment:   6 month(s)  Provider:   Candee Furbish, MD     Other Instructions     Signed, Candee Furbish, MD  12/19/2022 5:08 PM    [place]

## 2023-01-08 ENCOUNTER — Other Ambulatory Visit (HOSPITAL_COMMUNITY): Payer: Self-pay

## 2023-01-17 ENCOUNTER — Other Ambulatory Visit (HOSPITAL_COMMUNITY): Payer: Self-pay

## 2023-01-18 DIAGNOSIS — I10 Essential (primary) hypertension: Secondary | ICD-10-CM | POA: Diagnosis not present

## 2023-01-18 DIAGNOSIS — M2550 Pain in unspecified joint: Secondary | ICD-10-CM | POA: Diagnosis not present

## 2023-01-18 DIAGNOSIS — I2510 Atherosclerotic heart disease of native coronary artery without angina pectoris: Secondary | ICD-10-CM | POA: Diagnosis not present

## 2023-01-18 DIAGNOSIS — E7800 Pure hypercholesterolemia, unspecified: Secondary | ICD-10-CM | POA: Diagnosis not present

## 2023-01-18 DIAGNOSIS — N400 Enlarged prostate without lower urinary tract symptoms: Secondary | ICD-10-CM | POA: Diagnosis not present

## 2023-01-18 DIAGNOSIS — I471 Supraventricular tachycardia, unspecified: Secondary | ICD-10-CM | POA: Diagnosis not present

## 2023-01-18 LAB — LAB REPORT - SCANNED: EGFR: 56

## 2023-01-28 ENCOUNTER — Other Ambulatory Visit (HOSPITAL_COMMUNITY): Payer: Self-pay

## 2023-02-05 ENCOUNTER — Other Ambulatory Visit (HOSPITAL_COMMUNITY): Payer: Self-pay

## 2023-02-05 MED ORDER — ALFUZOSIN HCL ER 10 MG PO TB24
10.0000 mg | ORAL_TABLET | Freq: Every day | ORAL | 1 refills | Status: DC
  Filled 2023-02-05: qty 90, 90d supply, fill #0
  Filled 2023-05-06: qty 90, 90d supply, fill #1

## 2023-02-06 ENCOUNTER — Telehealth: Payer: Self-pay | Admitting: Cardiology

## 2023-02-06 NOTE — Telephone Encounter (Signed)
Pt is interested in starting cardiac rehab again and not sure if he need a referral to get that set up again. Please advise.

## 2023-02-07 NOTE — Telephone Encounter (Signed)
Spoke with the patient and advised that referral for cardiac rehab has been placed.

## 2023-02-27 ENCOUNTER — Other Ambulatory Visit: Payer: Self-pay | Admitting: Cardiology

## 2023-02-27 ENCOUNTER — Other Ambulatory Visit (HOSPITAL_COMMUNITY): Payer: Self-pay

## 2023-02-27 MED ORDER — NITROGLYCERIN 0.4 MG SL SUBL
0.4000 mg | SUBLINGUAL_TABLET | SUBLINGUAL | 11 refills | Status: DC | PRN
  Filled 2023-02-27: qty 25, 3d supply, fill #0
  Filled 2023-08-26: qty 25, 3d supply, fill #1

## 2023-02-28 ENCOUNTER — Other Ambulatory Visit (HOSPITAL_COMMUNITY): Payer: Self-pay

## 2023-04-24 ENCOUNTER — Other Ambulatory Visit (HOSPITAL_COMMUNITY): Payer: Self-pay

## 2023-04-24 ENCOUNTER — Other Ambulatory Visit: Payer: Self-pay | Admitting: Student

## 2023-04-24 MED ORDER — LOSARTAN POTASSIUM 25 MG PO TABS
25.0000 mg | ORAL_TABLET | Freq: Every day | ORAL | 2 refills | Status: DC
  Filled 2023-04-24: qty 90, 90d supply, fill #0
  Filled 2023-07-29: qty 90, 90d supply, fill #1
  Filled 2023-10-29: qty 90, 90d supply, fill #2

## 2023-04-25 ENCOUNTER — Other Ambulatory Visit (HOSPITAL_COMMUNITY): Payer: Self-pay

## 2023-04-29 ENCOUNTER — Other Ambulatory Visit (HOSPITAL_COMMUNITY): Payer: Self-pay

## 2023-04-30 ENCOUNTER — Other Ambulatory Visit (HOSPITAL_COMMUNITY): Payer: Self-pay

## 2023-04-30 DIAGNOSIS — H5203 Hypermetropia, bilateral: Secondary | ICD-10-CM | POA: Diagnosis not present

## 2023-05-02 ENCOUNTER — Other Ambulatory Visit (HOSPITAL_COMMUNITY): Payer: Self-pay

## 2023-06-28 ENCOUNTER — Encounter: Payer: Self-pay | Admitting: Cardiology

## 2023-06-28 ENCOUNTER — Ambulatory Visit: Payer: Commercial Managed Care - PPO | Attending: Cardiology | Admitting: Cardiology

## 2023-06-28 VITALS — BP 130/80 | HR 56 | Ht 71.5 in | Wt 183.4 lb

## 2023-06-28 DIAGNOSIS — I2583 Coronary atherosclerosis due to lipid rich plaque: Secondary | ICD-10-CM

## 2023-06-28 DIAGNOSIS — I10 Essential (primary) hypertension: Secondary | ICD-10-CM | POA: Diagnosis not present

## 2023-06-28 DIAGNOSIS — I5042 Chronic combined systolic (congestive) and diastolic (congestive) heart failure: Secondary | ICD-10-CM | POA: Diagnosis not present

## 2023-06-28 DIAGNOSIS — I4729 Other ventricular tachycardia: Secondary | ICD-10-CM | POA: Diagnosis not present

## 2023-06-28 DIAGNOSIS — I2510 Atherosclerotic heart disease of native coronary artery without angina pectoris: Secondary | ICD-10-CM

## 2023-06-28 NOTE — Patient Instructions (Signed)
Medication Instructions:  The current medical regimen is effective;  continue present plan and medications.  *If you need a refill on your cardiac medications before your next appointment, please call your pharmacy*  Follow-Up: At [facility], you and your health needs are our priority.  As part of our continuing mission to provide you with exceptional heart care, we have created designated Provider Care Teams.  These Care Teams include your primary Cardiologist (physician) and Advanced Practice Providers (APPs -  Physician Assistants and Nurse Practitioners) who all work together to provide you with the care you need, when you need it.  We recommend signing up for the patient portal called "MyChart".  Sign up information is provided on this After Visit Summary.  MyChart is used to connect with patients for Virtual Visits (Telemedicine).  Patients are able to view lab/test results, encounter notes, upcoming appointments, etc.  Non-urgent messages can be sent to your provider as well.   To learn more about what you can do with MyChart, go to [email or web address].    Your next appointment:   1 year(s)  Provider:   Donato Schultz, MD

## 2023-06-28 NOTE — Progress Notes (Signed)
Cardiology Office Note:  .   Date:  06/28/2023  ID:  Nicholas [name], DOB 07-Feb-1956, MRN [id number] PCP: Asencion Gowda.August Saucer, MD  [facility] Providers Cardiologist:  Donato Schultz, MD Electrophysiologist:  Lanier Prude, MD    History of Present Illness: .   Nicholas [name] is a 67 y.o. male Discussed with the use of AI scribe software  History of Present Illness   Nicholas [name], a patient with a history of CAD/ VT, including a bypass surgery 2003 and a heart catheterization in 2022, reports feeling generally well. The patient has been managing his condition with a regimen of amiodarone, metoprolol, losartan, baby aspirin, and Crestor. He has not reported any new or worsening symptoms related to his heart condition. However, he did mention an incident where he felt very winded after helping his boss move a table up some stairs, which he attributes to the physical exertion and has decided not to repeat such activities in the future. The patient has also recently recovered from a cold.        Studies Reviewed: Marland Kitchen   EKG Interpretation Date/Time:  Friday June 28 2023 15:55:59 EDT Ventricular Rate:  55 PR Interval:  202 QRS Duration:  112 QT Interval:  474 QTC Calculation: 453 R Axis:   33  Text Interpretation: Sinus bradycardia Possible Inferior infarct (cited on or before 18-Jun-2021) When compared with ECG of 19-Sep-2021 09:36, Premature ventricular complexes are no longer Present T wave inversion less evident in Inferior leads Confirmed by Donato Schultz ([id number]) on 06/28/2023 4:02:27 PM    09/21/2021 Zio HR 40 - 139 bpm, average 56 bpm. 1 SVT lasting 10 beats, likely atrial tachycardia. Rare supraventricular ectopy.  Occasional ventricular ectopy, 1.2%. No sustained arrhythmias.   Cath 2022: Left dominant coronary anatomy   Widely patent left main   Widely patent LAD but with total occlusion of a large first diagonal   Total occlusion of the distal circumflex which supplied the  PDA.  There is total occlusion of the large early arising first obtuse marginal.   No selective engagement of the presumed nondominant right coronary was possible.  Faint opacification of a small conus branch could be seen.  This is the same situation noted in 2003 prior to coronary bypass surgery.   Left ventriculography by hand-injection reveals a large inferobasal aneurysm.  EF less than 50%.  LVEDP is normal.   Atretic LIMA to LAD   60% ostial narrowing of the saphenous vein graft to the first obtuse marginal   Widely patent saphenous vein graft to first diagonal   Recommendations Guideline directed therapy for systolic dysfunction. Further evaluation as needed relative to ventricular arrhythmia per EP (amio).  LABS LDL: 51 (01/2023) Creatinine: 1.3 (01/2023) ALT: 11 (01/18/2023)  RADIOLOGY MRI of the heart: Mildly reduced pump function, ejection fraction 42%  DIAGNOSTIC Cardiac catheterization: Patent grafts with one small and narrow graft (2022)  Risk Assessment/Calculations:            Physical Exam:   VS:  BP 130/80   Pulse (!) 56   Ht 5' 11.5" (1.816 m)   Wt 183 lb 6.4 oz (83.2 kg)   SpO2 99%   BMI 25.22 kg/m    Wt Readings from Last 3 Encounters:  06/28/23 183 lb 6.4 oz (83.2 kg)  12/19/22 185 lb (83.9 kg)  06/18/22 184 lb 9.6 oz (83.7 kg)    GEN: Well nourished, well developed in no acute distress NECK: No JVD; No carotid bruits  CARDIAC: RRR, no murmurs, rubs, gallops RESPIRATORY:  Clear to auscultation without rales, wheezing or rhonchi  ABDOMEN: Soft, non-tender, non-distended EXTREMITIES:  No edema; No deformity   ASSESSMENT AND PLAN: .    Assessment and Plan    Coronary Artery Disease/CABG 2003 No new symptoms. Patent grafts on catheterization. LDL 51 on Crestor 40mg  daily. -Continue current regimen.  Non sustained ventricular tachycardia Managed with low dose Amiodarone 100mg  daily. No sustained tachycardia episodes reported. -Continue current  regimen. Appreciate Dr. Lalla Brothers EP  Mildly Reduced Ejection Fraction EF 42% on recent MRI. No symptoms of heart failure. On Metoprolol 25mg  and Losartan 25mg  daily. -Continue current regimen.  General Health Maintenance -Continue Aspirin 81mg  daily. -Continue monitoring liver and thyroid function tests due to Amiodarone use. -Follow up in 1 year or sooner if any changes.              Signed, Donato Schultz, MD

## 2023-07-19 ENCOUNTER — Other Ambulatory Visit (HOSPITAL_COMMUNITY): Payer: Self-pay

## 2023-07-22 DIAGNOSIS — Z125 Encounter for screening for malignant neoplasm of prostate: Secondary | ICD-10-CM | POA: Diagnosis not present

## 2023-07-22 DIAGNOSIS — I2510 Atherosclerotic heart disease of native coronary artery without angina pectoris: Secondary | ICD-10-CM | POA: Diagnosis not present

## 2023-07-22 DIAGNOSIS — I471 Supraventricular tachycardia, unspecified: Secondary | ICD-10-CM | POA: Diagnosis not present

## 2023-07-22 DIAGNOSIS — Z0000 Encounter for general adult medical examination without abnormal findings: Secondary | ICD-10-CM | POA: Diagnosis not present

## 2023-07-22 DIAGNOSIS — I10 Essential (primary) hypertension: Secondary | ICD-10-CM | POA: Diagnosis not present

## 2023-07-22 DIAGNOSIS — E7800 Pure hypercholesterolemia, unspecified: Secondary | ICD-10-CM | POA: Diagnosis not present

## 2023-07-22 DIAGNOSIS — Z23 Encounter for immunization: Secondary | ICD-10-CM | POA: Diagnosis not present

## 2023-07-22 DIAGNOSIS — Z1211 Encounter for screening for malignant neoplasm of colon: Secondary | ICD-10-CM | POA: Diagnosis not present

## 2023-07-22 DIAGNOSIS — N400 Enlarged prostate without lower urinary tract symptoms: Secondary | ICD-10-CM | POA: Diagnosis not present

## 2023-07-22 DIAGNOSIS — K219 Gastro-esophageal reflux disease without esophagitis: Secondary | ICD-10-CM | POA: Diagnosis not present

## 2023-07-23 ENCOUNTER — Other Ambulatory Visit: Payer: Self-pay | Admitting: Family Medicine

## 2023-07-23 DIAGNOSIS — Z136 Encounter for screening for cardiovascular disorders: Secondary | ICD-10-CM

## 2023-07-23 DIAGNOSIS — Z87891 Personal history of nicotine dependence: Secondary | ICD-10-CM

## 2023-07-29 ENCOUNTER — Other Ambulatory Visit: Payer: Self-pay | Admitting: Cardiology

## 2023-07-29 ENCOUNTER — Other Ambulatory Visit (HOSPITAL_COMMUNITY): Payer: Self-pay

## 2023-07-29 MED ORDER — ROSUVASTATIN CALCIUM 40 MG PO TABS
40.0000 mg | ORAL_TABLET | Freq: Every day | ORAL | 3 refills | Status: DC
  Filled 2023-07-29: qty 90, 90d supply, fill #0
  Filled 2023-10-29: qty 90, 90d supply, fill #1
  Filled 2024-01-20 (×2): qty 90, 90d supply, fill #2
  Filled 2024-04-27: qty 90, 90d supply, fill #3

## 2023-08-05 ENCOUNTER — Other Ambulatory Visit (HOSPITAL_COMMUNITY): Payer: Self-pay

## 2023-08-05 ENCOUNTER — Other Ambulatory Visit: Payer: Self-pay | Admitting: Cardiology

## 2023-08-05 MED ORDER — AMIODARONE HCL 100 MG PO TABS
100.0000 mg | ORAL_TABLET | Freq: Every day | ORAL | 3 refills | Status: DC
  Filled 2023-08-05: qty 90, 90d supply, fill #0

## 2023-08-06 ENCOUNTER — Other Ambulatory Visit (HOSPITAL_COMMUNITY): Payer: Self-pay

## 2023-08-06 MED ORDER — ALFUZOSIN HCL ER 10 MG PO TB24
10.0000 mg | ORAL_TABLET | Freq: Every day | ORAL | 2 refills | Status: DC
  Filled 2023-08-06: qty 90, 90d supply, fill #0
  Filled 2023-11-07: qty 90, 90d supply, fill #1
  Filled 2024-02-06: qty 90, 90d supply, fill #2

## 2023-08-07 ENCOUNTER — Other Ambulatory Visit (HOSPITAL_COMMUNITY): Payer: Self-pay

## 2023-08-26 ENCOUNTER — Other Ambulatory Visit (HOSPITAL_COMMUNITY): Payer: Self-pay

## 2023-09-09 ENCOUNTER — Telehealth: Payer: Self-pay | Admitting: Cardiology

## 2023-09-09 NOTE — Telephone Encounter (Signed)
Patient c/o Palpitations:  STAT if patient reporting lightheadedness, shortness of breath, or chest pain  How long have you had palpitations/irregular HR/ Afib? Are you having the symptoms now?   No  Are you currently experiencing lightheadedness, SOB or CP?   SOB when up and moving around  Do you have a history of afib (atrial fibrillation) or irregular heart rhythm?   Yes  Have you checked your BP or HR? (document readings if available):   BP 137/86   Are you experiencing any other symptoms?  No  Wife Massie Bougie) stated patient has been having symptoms since Saturday.

## 2023-09-09 NOTE — Telephone Encounter (Signed)
Spoke with wife, Massie Bougie (OK per [facility]) regarding pt's s/s.  She reports he has been having SOB when up moving around since Saturday and c/o heart beat skipping around off and on since Friday.  Per wife, HR today 49 and BP 110/89 which is normal for him per her report.  Pt denies any s/s of chest pain, dizziness, syncope etc.  Does have a history of frequent PVCs and bradycardia.  He reports taking medications as listed. Pt scheduled next available with Dr Anne Fu and advised if s/s change or worsen to report to the closest ED or call 911 for further evaluation.  Wife states understanding.

## 2023-09-10 ENCOUNTER — Telehealth: Payer: Self-pay | Admitting: Cardiology

## 2023-09-10 ENCOUNTER — Emergency Department (HOSPITAL_COMMUNITY): Payer: Commercial Managed Care - PPO

## 2023-09-10 ENCOUNTER — Encounter (HOSPITAL_COMMUNITY)
Admission: EM | Disposition: A | Discharge status: Home / Self Care | Payer: Self-pay | Source: Home / Self Care | Attending: Cardiology

## 2023-09-10 ENCOUNTER — Other Ambulatory Visit: Payer: Self-pay

## 2023-09-10 ENCOUNTER — Inpatient Hospital Stay (HOSPITAL_COMMUNITY)
Admission: EM | Admit: 2023-09-10 | Discharge: 2023-09-14 | DRG: 275 | Disposition: A | Discharge status: Home / Self Care | Payer: Commercial Managed Care - PPO | Attending: Cardiology | Admitting: Cardiology

## 2023-09-10 DIAGNOSIS — Z951 Presence of aortocoronary bypass graft: Secondary | ICD-10-CM

## 2023-09-10 DIAGNOSIS — I493 Ventricular premature depolarization: Secondary | ICD-10-CM | POA: Diagnosis present

## 2023-09-10 DIAGNOSIS — Z79899 Other long term (current) drug therapy: Secondary | ICD-10-CM

## 2023-09-10 DIAGNOSIS — R059 Cough, unspecified: Secondary | ICD-10-CM | POA: Diagnosis not present

## 2023-09-10 DIAGNOSIS — Z841 Family history of disorders of kidney and ureter: Secondary | ICD-10-CM | POA: Diagnosis not present

## 2023-09-10 DIAGNOSIS — I4510 Unspecified right bundle-branch block: Secondary | ICD-10-CM | POA: Diagnosis present

## 2023-09-10 DIAGNOSIS — Z8249 Family history of ischemic heart disease and other diseases of the circulatory system: Secondary | ICD-10-CM | POA: Diagnosis not present

## 2023-09-10 DIAGNOSIS — Z7982 Long term (current) use of aspirin: Secondary | ICD-10-CM

## 2023-09-10 DIAGNOSIS — R002 Palpitations: Secondary | ICD-10-CM

## 2023-09-10 DIAGNOSIS — I5043 Acute on chronic combined systolic (congestive) and diastolic (congestive) heart failure: Secondary | ICD-10-CM | POA: Diagnosis not present

## 2023-09-10 DIAGNOSIS — I252 Old myocardial infarction: Secondary | ICD-10-CM

## 2023-09-10 DIAGNOSIS — K219 Gastro-esophageal reflux disease without esophagitis: Secondary | ICD-10-CM | POA: Diagnosis present

## 2023-09-10 DIAGNOSIS — Z888 Allergy status to other drugs, medicaments and biological substances status: Secondary | ICD-10-CM

## 2023-09-10 DIAGNOSIS — N179 Acute kidney failure, unspecified: Secondary | ICD-10-CM | POA: Diagnosis not present

## 2023-09-10 DIAGNOSIS — Z87891 Personal history of nicotine dependence: Secondary | ICD-10-CM | POA: Diagnosis not present

## 2023-09-10 DIAGNOSIS — E782 Mixed hyperlipidemia: Secondary | ICD-10-CM | POA: Diagnosis not present

## 2023-09-10 DIAGNOSIS — E872 Acidosis, unspecified: Secondary | ICD-10-CM | POA: Diagnosis present

## 2023-09-10 DIAGNOSIS — N289 Disorder of kidney and ureter, unspecified: Secondary | ICD-10-CM | POA: Diagnosis not present

## 2023-09-10 DIAGNOSIS — I4729 Other ventricular tachycardia: Secondary | ICD-10-CM | POA: Diagnosis not present

## 2023-09-10 DIAGNOSIS — Z833 Family history of diabetes mellitus: Secondary | ICD-10-CM

## 2023-09-10 DIAGNOSIS — I255 Ischemic cardiomyopathy: Secondary | ICD-10-CM | POA: Diagnosis present

## 2023-09-10 DIAGNOSIS — I25810 Atherosclerosis of coronary artery bypass graft(s) without angina pectoris: Secondary | ICD-10-CM | POA: Diagnosis not present

## 2023-09-10 DIAGNOSIS — I10 Essential (primary) hypertension: Secondary | ICD-10-CM

## 2023-09-10 DIAGNOSIS — I2510 Atherosclerotic heart disease of native coronary artery without angina pectoris: Secondary | ICD-10-CM

## 2023-09-10 DIAGNOSIS — R0602 Shortness of breath: Secondary | ICD-10-CM | POA: Diagnosis not present

## 2023-09-10 DIAGNOSIS — I472 Ventricular tachycardia, unspecified: Secondary | ICD-10-CM | POA: Diagnosis not present

## 2023-09-10 DIAGNOSIS — Z95810 Presence of automatic (implantable) cardiac defibrillator: Secondary | ICD-10-CM | POA: Diagnosis not present

## 2023-09-10 DIAGNOSIS — N1831 Chronic kidney disease, stage 3a: Secondary | ICD-10-CM | POA: Diagnosis present

## 2023-09-10 DIAGNOSIS — Z555 Less than a high school diploma: Secondary | ICD-10-CM

## 2023-09-10 DIAGNOSIS — R9389 Abnormal findings on diagnostic imaging of other specified body structures: Secondary | ICD-10-CM | POA: Diagnosis not present

## 2023-09-10 DIAGNOSIS — I130 Hypertensive heart and chronic kidney disease with heart failure and stage 1 through stage 4 chronic kidney disease, or unspecified chronic kidney disease: Secondary | ICD-10-CM | POA: Diagnosis not present

## 2023-09-10 DIAGNOSIS — R0609 Other forms of dyspnea: Secondary | ICD-10-CM | POA: Diagnosis not present

## 2023-09-10 HISTORY — PX: LEFT HEART CATH AND CORS/GRAFTS ANGIOGRAPHY: CATH118250

## 2023-09-10 LAB — LACTIC ACID, PLASMA
Lactic Acid, Venous: 2.8 mmol/L (ref 0.5–1.9)
Lactic Acid, Venous: 1.9 mmol/L (ref 0.5–1.9)

## 2023-09-10 LAB — RESP PANEL BY RT-PCR (RSV, FLU A&B, COVID)  RVPGX2
Influenza A by PCR: NEGATIVE
Influenza B by PCR: NEGATIVE
Resp Syncytial Virus by PCR: NEGATIVE
SARS Coronavirus 2 by RT PCR: NEGATIVE

## 2023-09-10 LAB — BASIC METABOLIC PANEL
Anion gap: 14 (ref 5–15)
BUN: 21 mg/dL (ref 8–23)
CO2: 19 mmol/L — ABNORMAL LOW (ref 22–32)
Calcium: 9 mg/dL (ref 8.9–10.3)
Chloride: 109 mmol/L (ref 98–111)
Creatinine, Ser: 1.67 mg/dL — ABNORMAL HIGH (ref 0.61–1.24)
GFR, Estimated: 45 mL/min — ABNORMAL LOW (ref >60)
Glucose, Bld: 152 mg/dL — ABNORMAL HIGH (ref 70–99)
Potassium: 4 mmol/L (ref 3.5–5.1)
Sodium: 142 mmol/L (ref 135–145)

## 2023-09-10 LAB — CBC
HCT: 41.8 % (ref 39.0–52.0)
Hemoglobin: 14 g/dL (ref 13.0–17.0)
MCH: 28 pg (ref 26.0–34.0)
MCHC: 33.5 g/dL (ref 30.0–36.0)
MCV: 83.6 fL (ref 80.0–100.0)
Platelets: 161 10*3/uL (ref 150–400)
RBC: 5 MIL/uL (ref 4.22–5.81)
RDW: 14.7 % (ref 11.5–15.5)
WBC: 8.5 10*3/uL (ref 4.0–10.5)
nRBC: 0 % (ref 0.0–0.2)

## 2023-09-10 LAB — HEPATIC FUNCTION PANEL
ALT: 14 U/L (ref 0–44)
AST: 28 U/L (ref 15–41)
Albumin: 3.5 g/dL (ref 3.5–5.0)
Alkaline Phosphatase: 39 U/L (ref 38–126)
Bilirubin, Direct: 0.1 mg/dL (ref 0.0–0.2)
Indirect Bilirubin: 0.6 mg/dL (ref 0.3–0.9)
Total Bilirubin: 0.7 mg/dL (ref <1.2)
Total Protein: 6.9 g/dL (ref 6.5–8.1)

## 2023-09-10 LAB — TROPONIN I (HIGH SENSITIVITY)
Troponin I (High Sensitivity): 92 ng/L — ABNORMAL HIGH (ref <18)
Troponin I (High Sensitivity): 82 ng/L — ABNORMAL HIGH (ref <18)

## 2023-09-10 LAB — MRSA NEXT GEN BY PCR, NASAL: MRSA by PCR Next Gen: NOT DETECTED

## 2023-09-10 LAB — BRAIN NATRIURETIC PEPTIDE: B Natriuretic Peptide: 1002.6 pg/mL — ABNORMAL HIGH (ref 0.0–100.0)

## 2023-09-10 LAB — TSH: TSH: 2.767 u[IU]/mL (ref 0.350–4.500)

## 2023-09-10 LAB — D-DIMER, QUANTITATIVE: D-Dimer, Quant: 0.81 ug{FEU}/mL — ABNORMAL HIGH (ref 0.00–0.50)

## 2023-09-10 LAB — HIV ANTIBODY (ROUTINE TESTING W REFLEX): HIV Screen 4th Generation wRfx: NONREACTIVE

## 2023-09-10 SURGERY — LEFT HEART CATH AND CORS/GRAFTS ANGIOGRAPHY
Anesthesia: LOCAL

## 2023-09-10 MED ORDER — AMIODARONE HCL IN DEXTROSE 360-4.14 MG/200ML-% IV SOLN
30.0000 mg/h | INTRAVENOUS | Status: AC
  Administered 2023-09-10 – 2023-09-11 (×4): 30 mg/h via INTRAVENOUS
  Filled 2023-09-10 (×3): qty 200

## 2023-09-10 MED ORDER — HEPARIN SODIUM (PORCINE) 1000 UNIT/ML IJ SOLN
INTRAMUSCULAR | Status: AC
Start: 2023-09-10 — End: ?
  Filled 2023-09-10: qty 10

## 2023-09-10 MED ORDER — FENTANYL CITRATE (PF) 100 MCG/2ML IJ SOLN
INTRAMUSCULAR | Status: DC | PRN
  Administered 2023-09-10: 25 ug via INTRAVENOUS

## 2023-09-10 MED ORDER — SODIUM CHLORIDE 0.9 % IV SOLN: INTRAVENOUS | Status: DC

## 2023-09-10 MED ORDER — ADENOSINE 6 MG/2ML IV SOLN
INTRAVENOUS | Status: AC
  Administered 2023-09-10: 6 mg via INTRAVENOUS
  Filled 2023-09-10: qty 6

## 2023-09-10 MED ORDER — HEPARIN (PORCINE) IN NACL 1000-0.9 UT/500ML-% IV SOLN
INTRAVENOUS | Status: DC | PRN
  Administered 2023-09-10 (×2): 500 mL

## 2023-09-10 MED ORDER — AMIODARONE LOAD VIA INFUSION
150.0000 mg | Freq: Once | INTRAVENOUS | Status: AC
  Administered 2023-09-10: 150 mg via INTRAVENOUS
  Filled 2023-09-10: qty 83.34

## 2023-09-10 MED ORDER — FENTANYL CITRATE (PF) 100 MCG/2ML IJ SOLN
INTRAMUSCULAR | Status: AC
  Filled 2023-09-10: qty 2

## 2023-09-10 MED ORDER — SODIUM CHLORIDE 0.9% FLUSH
3.0000 mL | Freq: Two times a day (BID) | INTRAVENOUS | Status: DC
  Administered 2023-09-10 – 2023-09-14 (×6): 3 mL via INTRAVENOUS

## 2023-09-10 MED ORDER — AMIODARONE HCL IN DEXTROSE 360-4.14 MG/200ML-% IV SOLN
60.0000 mg/h | INTRAVENOUS | Status: DC
  Administered 2023-09-10 (×2): 60 mg/h via INTRAVENOUS
  Filled 2023-09-10 (×2): qty 200

## 2023-09-10 MED ORDER — MIDAZOLAM HCL 2 MG/2ML IJ SOLN
INTRAMUSCULAR | Status: DC | PRN
  Administered 2023-09-10: 2 mg via INTRAVENOUS

## 2023-09-10 MED ORDER — ADENOSINE 6 MG/2ML IV SOLN
12.0000 mg | Freq: Once | INTRAVENOUS | Status: AC
  Administered 2023-09-10: 12 mg via INTRAVENOUS

## 2023-09-10 MED ORDER — LIDOCAINE HCL (PF) 1 % IJ SOLN
INTRAMUSCULAR | Status: AC
  Filled 2023-09-10: qty 30

## 2023-09-10 MED ORDER — HEPARIN SODIUM (PORCINE) 1000 UNIT/ML IJ SOLN
INTRAMUSCULAR | Status: DC | PRN
  Administered 2023-09-10: 4000 [IU] via INTRAVENOUS

## 2023-09-10 MED ORDER — VERAPAMIL HCL 2.5 MG/ML IV SOLN
INTRAVENOUS | Status: DC | PRN
  Administered 2023-09-10: 10 mL via INTRA_ARTERIAL

## 2023-09-10 MED ORDER — SODIUM CHLORIDE 0.9 % WEIGHT BASED INFUSION: 3.0000 mL/kg/h | INTRAVENOUS | Status: DC

## 2023-09-10 MED ORDER — IOHEXOL 350 MG/ML SOLN
INTRAVENOUS | Status: DC | PRN
  Administered 2023-09-10: 77 mL

## 2023-09-10 MED ORDER — SODIUM CHLORIDE 0.9 % IV SOLN: INTRAVENOUS | Status: AC

## 2023-09-10 MED ORDER — MIDAZOLAM HCL 2 MG/2ML IJ SOLN
INTRAMUSCULAR | Status: AC
  Filled 2023-09-10: qty 2

## 2023-09-10 MED ORDER — VERAPAMIL HCL 2.5 MG/ML IV SOLN
INTRAVENOUS | Status: AC
  Filled 2023-09-10: qty 2

## 2023-09-10 MED ORDER — HEPARIN (PORCINE) 25000 UT/250ML-% IV SOLN
1100.0000 [IU]/h | INTRAVENOUS | Status: DC
  Administered 2023-09-10: 1100 [IU]/h via INTRAVENOUS

## 2023-09-10 MED ORDER — SODIUM CHLORIDE 0.9 % IV SOLN: 250.0000 mL | INTRAVENOUS | Status: AC | PRN

## 2023-09-10 MED ORDER — LIDOCAINE HCL (CARDIAC) PF 100 MG/5ML IV SOSY
100.0000 mg | PREFILLED_SYRINGE | Freq: Once | INTRAVENOUS | Status: AC
  Administered 2023-09-10: 100 mg via INTRAVENOUS

## 2023-09-10 MED ORDER — LIDOCAINE IN D5W 4-5 MG/ML-% IV SOLN
1.0000 mg/min | INTRAVENOUS | Status: DC
  Administered 2023-09-10: 1 mg/min via INTRAVENOUS
  Filled 2023-09-10: qty 500

## 2023-09-10 MED ORDER — ETOMIDATE 2 MG/ML IV SOLN
INTRAVENOUS | Status: AC | PRN
  Administered 2023-09-10: 10 mg via INTRAVENOUS

## 2023-09-10 MED ORDER — SODIUM CHLORIDE 0.9 % WEIGHT BASED INFUSION: 1.0000 mL/kg/h | INTRAVENOUS | Status: DC

## 2023-09-10 MED ORDER — SODIUM CHLORIDE 0.9% FLUSH: 3.0000 mL | INTRAVENOUS | Status: DC | PRN

## 2023-09-10 MED ORDER — SODIUM CHLORIDE 0.9 % IV SOLN
INTRAVENOUS | Status: AC | PRN
  Administered 2023-09-10: 250 mL/h via INTRAVENOUS

## 2023-09-10 MED ORDER — LIDOCAINE HCL (PF) 1 % IJ SOLN
INTRAMUSCULAR | Status: DC | PRN
  Administered 2023-09-10: 5 mL

## 2023-09-10 MED ORDER — FUROSEMIDE 10 MG/ML IJ SOLN
60.0000 mg | Freq: Once | INTRAMUSCULAR | Status: AC
  Administered 2023-09-10: 60 mg via INTRAVENOUS
  Filled 2023-09-10: qty 6

## 2023-09-10 MED ORDER — ADENOSINE 6 MG/2ML IV SOLN: 6.0000 mg | Freq: Once | INTRAVENOUS | Status: AC

## 2023-09-10 MED ORDER — CHLORHEXIDINE GLUCONATE CLOTH 2 % EX PADS
6.0000 | MEDICATED_PAD | Freq: Every day | CUTANEOUS | Status: DC
Start: 2023-09-10 — End: 2023-09-14
  Administered 2023-09-11 – 2023-09-14 (×4): 6 via TOPICAL

## 2023-09-10 MED ORDER — HEPARIN BOLUS VIA INFUSION
4000.0000 [IU] | Freq: Once | INTRAVENOUS | Status: AC
  Administered 2023-09-10: 4000 [IU] via INTRAVENOUS
  Filled 2023-09-10: qty 4000

## 2023-09-10 MED ORDER — ASPIRIN 81 MG PO CHEW: 81.0000 mg | CHEWABLE_TABLET | ORAL | Status: DC

## 2023-09-10 MED ORDER — ORAL CARE MOUTH RINSE: 15.0000 mL | OROMUCOSAL | Status: DC | PRN

## 2023-09-10 SURGICAL SUPPLY — 11 items
CATH INFINITI 5 FR 3DRC (CATHETERS) IMPLANT
CATH INFINITI 5FR AL1 (CATHETERS) IMPLANT
CATH INFINITI AMBI 5FR TG (CATHETERS) IMPLANT
CATH INFINITI JR4 5F (CATHETERS) IMPLANT
DEVICE RAD COMP TR BAND LRG (VASCULAR PRODUCTS) IMPLANT
ELECT DEFIB PAD ADLT CADENCE (PAD) IMPLANT
GLIDESHEATH SLEND A-KIT 6F 22G (SHEATH) IMPLANT
GUIDEWIRE INQWIRE 1.5J.035X260 (WIRE) IMPLANT
INQWIRE 1.5J .035X260CM (WIRE) ×1
PACK CARDIAC CATHETERIZATION (CUSTOM PROCEDURE TRAY) ×1 IMPLANT
SET ATX-X65L (MISCELLANEOUS) IMPLANT

## 2023-09-10 NOTE — ED Notes (Signed)
RT called for possible cardioversion

## 2023-09-10 NOTE — H&P (Signed)
ELECTROPHYSIOLOGY CONSULT NOTE    Patient ID: KAIF BAGSHAW MRN: [id number], DOB/AGE: December 19, 1955 67 y.o.  Admit date: 09/10/2023 Date of Consult: 09/10/2023  Primary Physician: Irven Coe, MD Primary Cardiologist: Donato Schultz, MD  Electrophysiologist: Dr. Lalla Brothers   Referring Provider: Dr. Andria Meuse  Patient Profile: Nicholas [name] is a 67 y.o. male with a history of CAD s/p CABG (2003), NSVT, PVCs, HTN, HLD who is being seen today for the evaluation of wide-complex tachycardia at the request of Dr. Andria Meuse.  HPI:  Nicholas [name] is a 67 y.o. male with PMH as above. He took his BP on home machine and it showed his HR was elevated to 160s, he also felt palpitations and was not feeling well, so presented to ER.   He was diagnosed in 2022 with NSVT and frequent PVCs, bigeminal at times when he presented to [facility] hospital with episodes of chest pain, LH, and dizziness. He had an updated LHC at that time that showed widely patent LAD with total occlusion of first diag, also total occlusion of distal circ. He had 60% narrowing of vein graft to first obtuse marginal, and widely patent graft to first diag.  He was started on amiodarone at that time.  He last saw Dr. Anne Fu 06/2023 and was doing well at that time. He did have one episode of SOB with exertion while helping to move a table.   He tells me that he began to feel unwell about a week ago. He checked his BP at that time and it showed his HR was in the high 40s, sometimes mid-50s which is his baseline. He did not have palpitations, chest pain, chest pressure at that time. He has noticed getting winded with activities that 6 months ago he used to be able to easily complete. He does not have SOB at rest. He denies any missed doses of medications, no sick contacts. He denies edema, chest pain, chest pressure, SOB currently.   His wife is with him in ER   In ER, has received 150mg  amio bolus followed by gtt that slowed his heart rate.  He  received adenosine 6mg , followed by 12mg  without any observed P-waves or change in rhythm Rec'd lidocaine bolus without change in rhythm He was DCCV to sinus rhythm      Labs Potassium4.0 (12/03 0858)   Creatinine, ser  1.67* (12/03 0858) PLT  161 (12/03 0858) HGB  14.0 (12/03 0858) WBC 8.5 (12/03 0858) Troponin I (High Sensitivity)92* (12/03 0858).    Past Medical History:  Diagnosis Date   Arthritis    knees, hands   Cataract    Coronary artery disease    Dysrhythmia 06/2014   sinus brady with PVC'S   GERD (gastroesophageal reflux disease)    Hepatitis C    Hx of adenomatous colonic polyps 09/21/2014   Hypercholesteremia    Hypertension    Myocardial infarction [facility])      Surgical History:  Past Surgical History:  Procedure Laterality Date   CARDIAC CATHETERIZATION     CARDIAC SURGERY     triple bypass   COLONOSCOPY     CORONARY ARTERY BYPASS GRAFT     CABG x 3 Vessels   LEFT HEART CATH AND CORS/GRAFTS ANGIOGRAPHY N/A 06/19/2021   Procedure: LEFT HEART CATH AND CORS/GRAFTS ANGIOGRAPHY;  Surgeon: Lyn Records, MD;  Location: [facility] INVASIVE CV LAB;  Service: Cardiovascular;  Laterality: N/A;     (Not in a hospital admission)   Inpatient Medications:  Allergies: No Known Allergies  Family History  Problem Relation Age of Onset   Diabetic kidney disease Mother    Kidney disease Mother    Throat cancer Mother    Cancer Mother    Diabetes Mother    Hypertension Mother    Esophageal cancer Mother    Other Father        unknown   Hypertension Father    Heart attack Neg Hx    Colon cancer Neg Hx    Stomach cancer Neg Hx    Rectal cancer Neg Hx    Colon polyps Neg Hx      Physical Exam: Vitals:   09/10/23 0831 09/10/23 0920 09/10/23 0937 09/10/23 0945  BP:  122/89 (!) 112/98 119/77  Pulse:   (!) 146 (!) 145  Resp:  19 15 (!) 21  Temp:      SpO2:  100% 100% 100%  Weight: 79.4 kg     Height: 5' 11.5" (1.816 m)       GEN- NAD, A&O x 3, normal  affect HEENT: Normocephalic, atraumatic Lungs- CTAB, Normal effort.  Heart- Regular, tachycardic rate and rhythm, No M/G/R.  GI- Soft, NT, ND.  Extremities- No clubbing, cyanosis, or edema   Radiology/Studies: DG Chest Portable 1 View  Result Date: 09/10/2023 CLINICAL DATA:  Cough.  Shortness of breath. EXAM: PORTABLE CHEST 1 VIEW COMPARISON:  06/16/2021. FINDINGS: Bilateral lung fields are clear. Elevated left hemidiaphragm seen. Bilateral costophrenic angles are clear. Normal cardio-mediastinal silhouette. Sternotomy wires noted. No acute osseous abnormalities. The soft tissues are within normal limits. IMPRESSION: *No active disease. Electronically Signed   By: Jules Schick M.D.   On: 09/10/2023 09:31    TTE, 07/03/2022  1. Left ventricular ejection fraction, by estimation, is 50 to 55%. Left ventricular ejection fraction by 3D volume is 50 %. The left ventricle has low normal function. The left ventricle has no regional wall motion abnormalities. There is moderate left ventricular hypertrophy. Left ventricular diastolic parameters are consistent with Grade I diastolic dysfunction (impaired relaxation).   2. Right ventricular systolic function is normal. The right ventricular size is normal. There is normal pulmonary artery systolic pressure. The estimated right ventricular systolic pressure is 18.8 mmHg.   3. The mitral valve is normal in structure. Trivial mitral valve regurgitation. No evidence of mitral stenosis.   4. The aortic valve is tricuspid. Aortic valve regurgitation is not visualized. No aortic stenosis is present.   5. The inferior vena cava is normal in size with greater than 50% respiratory variability, suggesting right atrial pressure of 3 mmHg.   Comparison(s): Prior images reviewed side by side. Prior EF 45-50%.   EKG: wide complex tachycardia with atypical RBBB, positive in AVR suggestive for VT (personally reviewed)  TELEMETRY: VA disassociation with narrow fusion beats  (personally reviewed)  DEVICE HISTORY: none  Assessment/Plan: #) Ventricular tachycardia #) CAD s/p CABG Presented to ER in wide-complex tachycardia concerning for VT Most recent TTE with low-normal LVEF S/p DCCV to sinus rhythm Plan to update LHC given h/o CAD Start heparin gtt, appreciate pharmacy's assistance Continue amio gtt Plan to update TTE after LHC   Admit to ICU     For questions or updates, please contact [facility] Please consult [email or web address] for contact info under Cardiology/STEMI.  Signed, Sherie Don, NP  09/10/2023 11:07 AM

## 2023-09-10 NOTE — Consult Note (Addendum)
Cardiology Consultation   Patient ID: Nicholas [name] MRN: [id number]; DOB: 08-Sep-1956  Admit date: 09/10/2023 Date of Consult: 09/10/2023  PCP:  Nicholas Coe, MD   [facility] Providers Cardiologist:  Nicholas Schultz, MD  Electrophysiologist:  Nicholas Prude, MD  {    Patient Profile:   Nicholas [name] is a 67 y.o. male with a hx of SVT, CAD s/p CABG 2003, ischemic cardiomyopathy, chronic systolic and diastolic heart failure, NSVT, HTN, HLD, CKD III, basal inferior aneurysm within the LV , PVCs, who is being seen 09/10/2023 for the evaluation of wide complex tachycardia at the request of Dr Nicholas [name].  History of Present Illness:   Nicholas [name] with above PMH presented to ER c/o heart palpitation and SOB.  Patient states he started having intermittent heart palpitation Thursday evening.  He felt his heart is racing and beating irregularly.  He felt short of breath with exertion with these episodes. He identifies no specific trigger of these episodes.  He noted each time symptom last several minutes and resolved spontaneously.  He denied ever having any significant chest pain or pressure, dizziness, or syncope.  Yesterday, he felt significantly winded and could not get himself to work.  Therefore he came to the ER today for further evaluation.  He felt overall improved while sitting at ED. He denied any significant leg edema, rapid weight gain.   Admission diagnostic so far revealed bicarb 19, glucose 152, creatinine 1.67, GFR 45.  BNP 1002.  High sensitive troponin 92 >82..  Lactic acid 2.8.  CBC grossly unremarkable.  D-dimer evaded 0.81.  TSH WNL.  Respiratory viral screen negative.  Blood culture sent.  Chest x-ray revealed no acute disease. EKG revealed wide-complex tachycardia 165 bpm, RBBB pattern, either AVNRT or VT. He was placed on amiodarone gtt at ED, heart rate slowed to 145bpm. Cardiology is consulted for further evaluation  Per chart review, he follows Dr Nicholas [name] outpatient,  has CAD and underwent CABG in 2003. He was hospitalized 06/2021 for non-STEMI with troponin peaked 292. LHC from 06/19/2021 revealing left dominant coronary anatomy, widely patent left main, widely patent LAD but was total occlusion of large first diagonal, total occlusion of distal circumflex which supplied the PDA, total occlusion of large early rising first OM, no selective engagement of the presumed nondominant right coronary with possible, faint opacification of small conus branch could be seen, similar to 2003, large inferior basal aneurysm, EF less than 50%, LVEDP normal, atretic LIMA to LAD,60% ostial narrowing of the saphenous vein graft to the first obtuse marginal, widely patent saphenous vein graft to first diagonal.  He was recommended GDMT for systolic dysfunction and EP evaluation for NSVT and PVCs. He was seen by EP, felt patient's NSVT was ischemic in nature and related to basal inferior aneurysm.  He was recommended MRI for evaluation of LV scar burden and LV function. Cardiac MRI from 06/21/2021 sowed LGE and myocardial thinning with normal T2 signal: Lack of edema and subendocardial pattern are more consistent with ischemic disease than sarcoidosis.  LVEF 42%.  RVEF 32%. Abnormal basal RV function without aneurysm.  ZIO monitor from 08/30/2021 revealing 1 SVT lasting 10 beats, likely atrial tachycardia, rare supraventricular ectopy, occasional ventricular ectopy 1.2%, no sustained arrhythmias. He followed up with Dr. Lalla [name] in the office 10/05/21, recommended to continue amiodarone 100 mg daily along with long-acting metoprolol for NSVT/PVCs.  Most recent echo from 07/03/2022 LVEF 50 to 55%, no regional motion abnormality, moderate LVH, grade 1  DD, normal RV, PASP 18.8 mmHg, trivial MR. He was last seen by Dr. Anne [name] on 06/28/2023, doing well, was advised to continue aspirin, metoprolol, losartan, Crestor for CAD/CHF, tolerating amiodarone without issues.  Past Medical History:  Diagnosis Date    Arthritis    knees, hands   Cataract    Coronary artery disease    Dysrhythmia 06/2014   sinus brady with PVC'S   GERD (gastroesophageal reflux disease)    Hepatitis C    Hx of adenomatous colonic polyps 09/21/2014   Hypercholesteremia    Hypertension    Myocardial infarction [facility])     Past Surgical History:  Procedure Laterality Date   CARDIAC CATHETERIZATION     CARDIAC SURGERY     triple bypass   COLONOSCOPY     CORONARY ARTERY BYPASS GRAFT     CABG x 3 Vessels   LEFT HEART CATH AND CORS/GRAFTS ANGIOGRAPHY N/A 06/19/2021   Procedure: LEFT HEART CATH AND CORS/GRAFTS ANGIOGRAPHY;  Surgeon: Lyn Records, MD;  Location: [facility] INVASIVE CV LAB;  Service: Cardiovascular;  Laterality: N/A;     Home Medications:  Prior to Admission medications   Medication Sig Start Date End Date Taking? Authorizing Provider  alfuzosin (UROXATRAL) 10 MG 24 hr tablet Take 1 tablet (10 mg total) by mouth daily. 02/05/23     alfuzosin (UROXATRAL) 10 MG 24 hr tablet Take 1 tablet (10 mg total) by mouth daily immediately after meal for 90 days. 08/06/23     amiodarone (PACERONE) 100 MG tablet Take 1 tablet (100 mg total) by mouth daily. 08/05/23   Nicholas Bathe, MD  aspirin EC 81 MG tablet Take 81 mg by mouth daily with breakfast.    [provider]  docusate sodium (COLACE) 100 MG capsule Take 100 mg by mouth every evening.    [provider]  losartan (COZAAR) 25 MG tablet Take 1 tablet (25 mg total) by mouth at bedtime. 04/24/23   Nicholas Bathe, MD  metoprolol succinate (TOPROL XL) 25 MG 24 hr tablet Take 1 tablet (25 mg total) by mouth daily. 10/04/22   Nicholas Bathe, MD  nitroGLYCERIN (NITROSTAT) 0.4 MG SL tablet Place 1 tablet (0.4 mg total) under the tongue every 5 (five) minutes x 3 doses as needed for chest pain. 02/27/23   Nicholas Bathe, MD  rosuvastatin (CRESTOR) 40 MG tablet Take 1 tablet (40 mg total) by mouth daily at 6 PM. 07/29/23   Nicholas Bathe, MD    Inpatient  Medications: Scheduled Meds:  [START ON 09/11/2023] aspirin  81 mg Oral Pre-Cath   furosemide  60 mg Intravenous Once   Continuous Infusions:  sodium chloride     amiodarone 60 mg/hr (09/10/23 1254)   Followed by   amiodarone     heparin 1,100 Units/hr (09/10/23 1342)   PRN Meds:   Allergies:    Allergies  Allergen Reactions   Etomidate Other (See Comments)    Intolerance    Social History:   Social History   Socioeconomic History   Marital status: Married    Spouse name: Not on file   Number of children: Not on file   Years of education: 11   Highest education level: 11th grade  Occupational History   Not on file  Tobacco Use   Smoking status: Former    Current packs/day: 0.00    Types: Cigarettes    Quit date: 06/10/2002    Years since quitting: 21.2   Smokeless tobacco: Never  Vaping Use   Vaping status: Never Used  Substance and Sexual Activity   Alcohol use: Yes    Alcohol/week: 7.0 standard drinks of alcohol    Types: 7 Cans of beer per week   Drug use: Yes    Frequency: 3.0 times per week    Types: Marijuana    Comment: occ   Sexual activity: Yes  Other Topics Concern   Not on file  Social History Narrative   Not on file   Social Determinants of Health   Financial Resource Strain: Not on file  Food Insecurity: Not on file  Transportation Needs: Not on file  Physical Activity: Not on file  Stress: Not on file  Social Connections: Not on file  Intimate Partner Violence: Not on file    Family History:    Family History  Problem Relation Age of Onset   Diabetic kidney disease Mother    Kidney disease Mother    Throat cancer Mother    Cancer Mother    Diabetes Mother    Hypertension Mother    Esophageal cancer Mother    Other Father        unknown   Hypertension Father    Heart attack Neg Hx    Colon cancer Neg Hx    Stomach cancer Neg Hx    Rectal cancer Neg Hx    Colon polyps Neg Hx      ROS:  Constitutional: Denied fever,  chills, malaise, night sweats Eyes: Denied vision change or loss Ears/Nose/Mouth/Throat: Denied ear ache, sore throat, coughing, sinus pain Cardiovascular: see HPI  Respiratory: see HPI  Gastrointestinal: Denied nausea, vomiting, abdominal pain, diarrhea Genital/Urinary: Denied dysuria, hematuria, urinary frequency/urgency Musculoskeletal: Denied muscle ache, joint pain, weakness Skin: Denied rash, wound Neuro: Denied headache, dizziness, syncope Psych: Denied history of depression/anxiety  Endocrine: Denied history of diabetes     Physical Exam/Data:   Vitals:   09/10/23 1245 09/10/23 1250 09/10/23 1255 09/10/23 1300  BP:   (!) 178/106 (!) 136/100  Pulse:  (!) 144  79  Resp:  (!) 21 (!) 28 (!) 21  Temp: 97.6 F (36.4 C)     SpO2:  100%  99%  Weight:      Height:       No intake or output data in the 24 hours ending 09/10/23 1347    09/10/2023    8:31 AM 06/28/2023    3:54 PM 12/19/2022    4:24 PM  Last 3 Weights  Weight (lbs) 175 lb 183 lb 6.4 oz 185 lb  Weight (kg) 79.379 kg 83.19 kg 83.915 kg     Body mass index is 24.07 kg/m.   Vitals:  Vitals:   09/10/23 1255 09/10/23 1300  BP: (!) 178/106 (!) 136/100  Pulse:  79  Resp: (!) 28 (!) 21  Temp:    SpO2:  99%   General Appearance: In no apparent distress, laying in bed HEENT: Normocephalic, atraumatic.  Neck: Supple, trachea midline, no JVDs Cardiovascular: Regular rhythm, tachycardic, normal S1-S2, no murmur Respiratory: Resting breathing unlabored, lungs sounds clear to auscultation bilaterally, no use of accessory muscles. On room air.  Speak full sentence. Gastrointestinal: Bowel sounds positive, abdomen soft, non-tender, non-distended.  Extremities: Able to move all extremities in bed without difficulty, no edema bilateral lower extremity  Musculoskeletal: Normal muscle bulk and tone Skin: Intact, warm, dry.  Neurologic: Alert, oriented to person, place and time. no gross focal neuro deficit Psychiatric:  Normal affect. Mood is appropriate.  EKG:  The EKG was personally reviewed and demonstrates:    EKG today revealed wide-complex tachycardia 165 bpm, RBBB pattern, suggestive of AVRT but VT cannot be ruled out.  Telemetry:  Telemetry was personally reviewed and demonstrates:    Wide-complex tachycardia 145bpm    Relevant CV Studies:  Echocardiogram 07/03/2022:   1. Left ventricular ejection fraction, by estimation, is 50 to 55%. Left  ventricular ejection fraction by 3D volume is 50 %. The left ventricle has  low normal function. The left ventricle has no regional wall motion  abnormalities. There is moderate left   ventricular hypertrophy. Left ventricular diastolic parameters are  consistent with Grade I diastolic dysfunction (impaired relaxation).   2. Right ventricular systolic function is normal. The right ventricular  size is normal. There is normal pulmonary artery systolic pressure. The  estimated right ventricular systolic pressure is 18.8 mmHg.   3. The mitral valve is normal in structure. Trivial mitral valve  regurgitation. No evidence of mitral stenosis.   4. The aortic valve is tricuspid. Aortic valve regurgitation is not  visualized. No aortic stenosis is present.   5. The inferior vena cava is normal in size with greater than 50%  respiratory variability, suggesting right atrial pressure of 3 mmHg.   Comparison(s): Prior images reviewed side by side. Prior EF 45-50%.    ZIO monitor from 08/30/2021:  HR 40 - 139 bpm, average 56 bpm. 1 SVT lasting 10 beats, likely atrial tachycardia. Rare supraventricular ectopy.  Occasional ventricular ectopy, 1.2%. No sustained arrhythmias.   Sheria Lang T. Nicholas Brothers, MD, [facility], [facility] Cardiac Electrophysiology   Cardiac MRI from 06/21/2021:  FINDINGS: 1. Normal left ventricular size, with LVEDD 43 mm, and LVEDVi 67 mL/m2.   Normal left ventricular thickness, with intraventricular septal thickness of 8 mm, posterior wall  thickness of 9 mm, and septal to posterior ratio < 1.5.   Myocardial mass index normal at 70 g/m2.   Mildly reduced left ventricular systolic function (LVEF =42%). There is inferior and inferoseptal dyskinesis.   Left ventricular parametric mapping notable for significant elevated in the ECV signal in the areas of dyskinesis (basal inferior 71% with increase inferolateral ECV of 49% base and 44% mid) and normal T2 signal.   There is late gadolinium enhancement in the left ventricular myocardium. Transmural in the inferoseptal and inferior base with associated thinning, there is up to 50% subendocardial LGE in the inferolateral based to mid.   2. Normal right ventricular size with RVEDVI 80 mL/m2.   Normal right ventricular thickness.   Moderate right ventricular systolic dysfunction (RVEF =32%). Abnormal basal RV function without aneurysm.   3.  Normal left and right atrial size.   4. Normal size of the aortic root, ascending aorta and pulmonary artery.   5.  Valve disease as below.   Tri-leaflet aortic valve. Not suggestive of significant aortic stenosis, mild to moderate regurgitation by regurgitation fraction (16%), though this may be overestimated in the setting of notable artifact.   Pulmonary valve not suggestive of significant stenosis or regurgitation   Tricuspid valve with qualitatively mild regurgitation.   Mitral valve is central and mild based on LV to RV comparison (given flow related artifact).   6.  Normal pericardium.  No pericardial effusion.   7. Grossly, no extracardiac findings. Recommended dedicated study if concerned for non-cardiac pathology.   8. Notable PVCs during procedure decreases accuracy of volumetric assessment and valve quantification. There is artifact related to sternotomy wires. Breath hold artifact during flow  acquisitions.   IMPRESSION: LGE and myocardial thinning with normal T2 signal: Lack of edema and subendocardial  pattern are more consistent with ischemic disease than sarcoidosis.   Functional assessment limited in the setting of breath hold artifact and PVCs.   Left heart catheterization 06/19/2021:    Left dominant coronary anatomy   Widely patent left main   Widely patent LAD but with total occlusion of a large first diagonal   Total occlusion of the distal circumflex which supplied the PDA.  There is total occlusion of the large early arising first obtuse marginal.   No selective engagement of the presumed nondominant right coronary was possible.  Faint opacification of a small conus branch could be seen.  This is the same situation noted in 2003 prior to coronary bypass surgery.   Left ventriculography by hand-injection reveals a large inferobasal aneurysm.  EF less than 50%.  LVEDP is normal.   Atretic LIMA to LAD   60% ostial narrowing of the saphenous vein graft to the first obtuse marginal   Widely patent saphenous vein graft to first diagonal   Recommendations Guideline directed therapy for systolic dysfunction. Further evaluation as needed relative to ventricular arrhythmia per EP.    Laboratory Data:  High Sensitivity Troponin:   Recent Labs  Lab 09/10/23 0858 09/10/23 1045  TROPONINIHS 92* 82*     Chemistry Recent Labs  Lab 09/10/23 0858  NA 142  K 4.0  CL 109  CO2 19*  GLUCOSE 152*  BUN 21  CREATININE 1.67*  CALCIUM 9.0  GFRNONAA 45*  ANIONGAP 14    No results for input(s): "PROT", "ALBUMIN", "AST", "ALT", "ALKPHOS", "BILITOT" in the last 168 hours. Lipids No results for input(s): "CHOL", "TRIG", "HDL", "LABVLDL", "LDLCALC", "CHOLHDL" in the last 168 hours.  Hematology Recent Labs  Lab 09/10/23 0858  WBC 8.5  RBC 5.00  HGB 14.0  HCT 41.8  MCV 83.6  MCH 28.0  MCHC 33.5  RDW 14.7  PLT 161   Thyroid  Recent Labs  Lab 09/10/23 0858  TSH 2.767    BNP Recent Labs  Lab 09/10/23 0858  BNP 1,002.6*    DDimer  Recent Labs  Lab 09/10/23 0858   DDIMER 0.81*     Radiology/Studies:  DG Chest Portable 1 View  Result Date: 09/10/2023 CLINICAL DATA:  Cough.  Shortness of breath. EXAM: PORTABLE CHEST 1 VIEW COMPARISON:  06/16/2021. FINDINGS: Bilateral lung fields are clear. Elevated left hemidiaphragm seen. Bilateral costophrenic angles are clear. Normal cardio-mediastinal silhouette. Sternotomy wires noted. No acute osseous abnormalities. The soft tissues are within normal limits. IMPRESSION: *No active disease. Electronically Signed   By: Jules Schick M.D.   On: 09/10/2023 09:31     Assessment and Plan:   Wide-complex tachycardia likely SVT - Presented with intermittent heart palpitation and shortness of breath since Thursday, 09/05/23 - TSH WNL  - EKG and telemetry revealing wide-complex tachycardia  - Agree with IV amiodarone gtt for now, continue PTA Toprol XL 25 mg daily  - EP consult has been requested today, appreciate   Acute on chronic systolic and diastolic heart failure -Complaining shortness of breath with exertion since Thursday - BNP 1002 - CXR no acute finding -Clinically euvolemic on exam  -Suspect mild/early decompensated CHF due to tachyarrhythmia, aim for rate control, attempt IV Lasix 60 mg x 1, lactic acidosis resolved, will add LFTs to elevate perfusion.  -plan to repeat echo once heart rate is improved -GDMT: Continue PTA Toprol-XL 25 mg daily, hold  PTA losartan 25 mg daily in the setting of AKI  CAD with history of CABG 2003 -High send troponin 92 >82 -EKG nondiagnostic -Cath from 06/2021 with stable disease -He denies any chest pain -Suspect this is demand in the setting of tachyarrhythmia -Continue medical therapy with aspirin 81 mg daily, Crestor 40 mg daily, Toprol-XL 25 mg daily, hold losartan  AKI on CKD stage III Elevated D-dimer Lactic acidosis -Defer to internal medicine for management   Risk Assessment/Risk Scores:   New York Heart Association (NYHA) Functional Class NYHA Class  II  For questions or updates, please contact [facility] Please consult [email or web address] for contact info under    Signed, Cyndi Bender, NP  09/10/2023 10:00 AM   ADDENDUM:   Patient seen and examined with Cyndi Bender, NP.  I personally taken a history, examined the patient, reviewed relevant notes,  laboratory data / imaging studies.  I performed a substantive portion of this encounter and formulated the important aspects of the plan.  I agree with the APP's note, impression, and recommendations; however, I have edited the note to reflect changes or salient points.  Patient presents to the hospital with complaints of palpitations and progressive shortness of breath.  The symptoms started on Thanksgiving day 08/28/2023 and has been getting progressively worse.  Patient noted to be in wide-complex tachycardia upon arrival to the emergency room department.  Cardiology consulted for further evaluation and management.  By the time I saw the patient in room ER 26 he had already undergone direct-current cardioversion for wide-complex tachycardia.  Discussion with EP confirms initial rhythm was ventricular tachycardia.  He was given adenosine 6 mg followed by 12 mg as well as lidocaine.  Subsequently underwent direct-current cardioversion with 200 J x 1 and now in normal sinus rhythm.  Patient is awake and alert.  He denies anginal chest pain or heart failure symptoms.  No near-syncope or syncopal events.  Patient's been taking his medications regularly.   PHYSICAL EXAM: Today's Vitals   09/10/23 1245 09/10/23 1250 09/10/23 1255 09/10/23 1300  BP:   (!) 178/106 (!) 136/100  Pulse:  (!) 144  79  Resp:  (!) 21 (!) 28 (!) 21  Temp: 97.6 F (36.4 C)     SpO2:  100%  99%  Weight:      Height:      PainSc:       Body mass index is 24.07 kg/m.   Net IO Since Admission: No IO data has been entered for this period [09/10/23 1347]  Filed Weights   09/10/23 0831  Weight: 79.4 kg     Physical Exam  Constitutional: No distress.  hemodynamically stable, age-appropriate, well-built  Neck: No JVD present.  Cardiovascular: Normal rate, regular rhythm, S1 normal and S2 normal. Exam reveals no gallop, no S3 and no S4.  No murmur heard. Pulmonary/Chest: Effort normal and breath sounds normal. No stridor. He has no wheezes. He has no rales.  Abdominal: Soft. Bowel sounds are normal. He exhibits no distension. There is no abdominal tenderness.  Musculoskeletal:        General: No edema.     Cervical back: Neck supple.  Neurological: He is alert and oriented to person, place, and time. He has intact cranial nerves (2-12).  Skin: Skin is warm.    EKG: (personally reviewed by me) 09/10/2023: Wide-complex tachycardia, 165 bpm, left axis, atypical right bundle, aVR positive deflection, concerning for ventricular tachycardia.   09/10/2023: Sinus rhythm, 76 bpm, TWI in  the inferolateral leads concerning for ischemia.    Telemetry: (personally reviewed by me) At the time of evaluation sinus rhythm without ectopy   Impression:  Wide-complex tachycardia-consistent with ventricular tachycardia Dyspnea on exertion. Palpitation History of nonsustained ventricular tachycardia and PVCs. Long-term antiarrhythmic medications Coronary artery disease without angina pectoris, status post CABG Acute renal insufficiency -no recent baseline creatinine Benign essential hypertension. Hyperlipidemia.  Recommendations:  Patient presented to the hospital due to shortness of breath and palpitations ongoing since 08/28/2023.  Symptoms were progressive.  Initial EKG noted wide-complex tachycardia initially felt to be AVNRT but after discussion with EP consistent with ventricular tachycardia given the atypical right bundle branch morphology, A-V dissociation, and positive aVR.  Under EP guidance patient received adenosine 6 mg followed by 12 mg.  Lidocaine followed by direct-current  cardioversion 200 J x 1.  Currently normal sinus rhythm.  Post cardioversion EKG notes sinus rhythm with diffuse inferior and lateral T wave inversions concerning for ischemia.  He has had known history of nonsustained ventricular tachycardia and PVCs likely mediated by ischemia in the past.  He is undergone heart catheterization cardiac MRI, and recent echocardiography.  He will need to have repeat ischemic workup during his hospitalization (echo and heart heart catheterization), plus or minus repeat cardiac MRI per EP recommendations, and to be considered for ICD implant for primary prevention depending on the work up.  VT management per EP team - appreciate their recommendations.   Agree with IV Lasix for volume management, strict I's and O's, daily weights Continue ASA 81mg  po day and statin  Check fasting lipids, Lpa, A1c.  Will need to uptitrate GDMT as renal function and hemodynamics allow.  EP plans to admit and general cardiology will follow with you.   Further recommendations to follow as the case evolves.   This note was created using a voice recognition software as a result there may be grammatical errors inadvertently enclosed that do not reflect the nature of this encounter. Every attempt is made to correct such errors.   Tessa Lerner, DO, [facility]  [address] [place], [place] [address] Pager: [phone] Office: [phone] 09/10/2023 1:47 PM

## 2023-09-10 NOTE — ED Triage Notes (Signed)
Pt. Stated, when I got up around 630 I could feel my heart beating and SOB . Something new. Denies any other symptoms.

## 2023-09-10 NOTE — Sedation Documentation (Signed)
Pt having intolerance to etomidate. MD trying to calm pt down. Pt sitting upright removing vital sign equipment. Unable to obtain 1250 vitals d/t reaction.

## 2023-09-10 NOTE — Interval H&P Note (Signed)
History and Physical Interval Note:  09/10/2023 4:24 PM  Nicholas [name]  has presented today for surgery, with the diagnosis of ventricular tachycardia/ CAD.  The various methods of treatment have been discussed with the patient and family. After consideration of risks, benefits and other options for treatment, the patient has consented to  Procedure(s): LEFT HEART CATH AND CORS/GRAFTS ANGIOGRAPHY (N/A) and possible coronary angioplasty as a surgical intervention.  The patient's history has been reviewed, patient examined, no change in status, stable for surgery.  I have reviewed the patient's chart and labs.  Questions were answered to the patient's satisfaction.     Yates Decamp

## 2023-09-10 NOTE — Telephone Encounter (Signed)
Patient spouse is calling to report his symptoms did worsen so she has taken him to the ED where he is currently admitted.

## 2023-09-10 NOTE — Progress Notes (Signed)
PHARMACY - ANTICOAGULATION CONSULT NOTE  Pharmacy Consult for heparin Indication: atrial fibrillation  No Known Allergies  Patient Measurements: Height: 5' 11.5" (181.6 cm) Weight: 79.4 kg (175 lb) IBW/kg (Calculated) : 76.45 Heparin Dosing Weight: TBW  Vital Signs: Temp: 97.7 F (36.5 C) (12/03 0819) BP: 112/101 (12/03 1215) Pulse Rate: 145 (12/03 1215)  Labs: Recent Labs    09/10/23 0858 09/10/23 1045  HGB 14.0  --   HCT 41.8  --   PLT 161  --   CREATININE 1.67*  --   TROPONINIHS 92* 82*    Estimated Creatinine Clearance: 46.4 mL/min (A) (by C-G formula based on SCr of 1.67 mg/dL (H)).   Medical History: Past Medical History:  Diagnosis Date   Arthritis    knees, hands   Cataract    Coronary artery disease    Dysrhythmia 06/2014   sinus brady with PVC'S   GERD (gastroesophageal reflux disease)    Hepatitis C    Hx of adenomatous colonic polyps 09/21/2014   Hypercholesteremia    Hypertension    Myocardial infarction [facility])       Assessment: 57 YOM presenting with tachycardia, concern for aflutter, he is not on anticoagulation PTA  Goal of Therapy:  Heparin level 0.3-0.7 units/ml Monitor platelets by anticoagulation protocol: Yes   Plan:  Heparin 4000 units IV x 1, and gtt at 1100 units/hr F/u 6 hour heparin level F/u cards/EP recs and long term [facility] plan  Daylene Posey, PharmD, [facility] Clinical Pharmacist ED Pharmacist Phone # [phone] 09/10/2023 12:34 PM

## 2023-09-10 NOTE — Plan of Care (Signed)

## 2023-09-10 NOTE — ED Notes (Signed)
RT present for bedside cardioversion. Pt placed on ETCO2 monitoring. Ambu bag set up as well as suction at bedside. RT released by MD post procedure.

## 2023-09-10 NOTE — Telephone Encounter (Signed)
Noted thank you

## 2023-09-10 NOTE — ED Provider Notes (Signed)
[place] EMERGENCY DEPARTMENT AT [facility] Provider Note  CSN: [id number] Arrival date & time: 09/10/23 7829  Chief Complaint(s) Shortness of Breath and Tachycardia  HPI Nicholas [name] is a 67 y.o. male here today for rapid heart rate.  Patient has an extensive cardiac history including CABG in early 2000's, had a heart catheterization 2022.  Patient takes amiodarone for V. tach.  Patient says that over the last few days and feeling short of breath.  Took his blood pressure this morning noticed his heart rate was elevated.  Upon arrival to the emergency department, patient's heart rate was in the 160s.   Past Medical History Past Medical History:  Diagnosis Date   Arthritis    knees, hands   Cataract    Coronary artery disease    Dysrhythmia 06/2014   sinus brady with PVC'S   GERD (gastroesophageal reflux disease)    Hepatitis C    Hx of adenomatous colonic polyps 09/21/2014   Hypercholesteremia    Hypertension    Myocardial infarction [facility])    Patient Active Problem List   Diagnosis Date Noted   VT (ventricular tachycardia) (HCC) 09/10/2023   NSVT (nonsustained ventricular tachycardia) (HCC) 06/18/2021   NSTEMI (non-ST elevated myocardial infarction) (HCC) 06/16/2021   Angina at rest (HCC) 06/16/2021   Liver fibrosis 12/19/2015   Chronic hepatitis C without hepatic coma (HCC) 04/13/2015   Hx of adenomatous colonic polyps 09/21/2014   CAD- CABG x 3 in [place] '03 05/21/2014   Dizziness when bending over 05/21/2014   Frequent PVCs 05/21/2014   Sinus bradycardia 05/21/2014   Chronic renal insufficiency, stage II (mild) 05/21/2014   Dyslipidemia 05/21/2014   Near syncope 05/21/2014   Home Medication(s) Prior to Admission medications   Medication Sig Start Date End Date Taking? Authorizing Provider  alfuzosin (UROXATRAL) 10 MG 24 hr tablet Take 1 tablet (10 mg total) by mouth daily. 02/05/23     alfuzosin (UROXATRAL) 10 MG 24 hr tablet Take 1 tablet (10 mg  total) by mouth daily immediately after meal for 90 days. 08/06/23     amiodarone (PACERONE) 100 MG tablet Take 1 tablet (100 mg total) by mouth daily. 08/05/23   Jake Bathe, MD  aspirin EC 81 MG tablet Take 81 mg by mouth daily with breakfast.    [provider]  docusate sodium (COLACE) 100 MG capsule Take 100 mg by mouth every evening.    [provider]  losartan (COZAAR) 25 MG tablet Take 1 tablet (25 mg total) by mouth at bedtime. 04/24/23   Jake Bathe, MD  metoprolol succinate (TOPROL XL) 25 MG 24 hr tablet Take 1 tablet (25 mg total) by mouth daily. 10/04/22   Jake Bathe, MD  nitroGLYCERIN (NITROSTAT) 0.4 MG SL tablet Place 1 tablet (0.4 mg total) under the tongue every 5 (five) minutes x 3 doses as needed for chest pain. 02/27/23   Jake Bathe, MD  rosuvastatin (CRESTOR) 40 MG tablet Take 1 tablet (40 mg total) by mouth daily at 6 PM. 07/29/23   Jake Bathe, MD  Past Surgical History Past Surgical History:  Procedure Laterality Date   CARDIAC CATHETERIZATION     CARDIAC SURGERY     triple bypass   COLONOSCOPY     CORONARY ARTERY BYPASS GRAFT     CABG x 3 Vessels   LEFT HEART CATH AND CORS/GRAFTS ANGIOGRAPHY N/A 06/19/2021   Procedure: LEFT HEART CATH AND CORS/GRAFTS ANGIOGRAPHY;  Surgeon: Lyn Records, MD;  Location: [facility] INVASIVE CV LAB;  Service: Cardiovascular;  Laterality: N/A;   Family History Family History  Problem Relation Age of Onset   Diabetic kidney disease Mother    Kidney disease Mother    Throat cancer Mother    Cancer Mother    Diabetes Mother    Hypertension Mother    Esophageal cancer Mother    Other Father        unknown   Hypertension Father    Heart attack Neg Hx    Colon cancer Neg Hx    Stomach cancer Neg Hx    Rectal cancer Neg Hx    Colon polyps Neg Hx     Social History Social  History   Tobacco Use   Smoking status: Former    Current packs/day: 0.00    Types: Cigarettes    Quit date: 06/10/2002    Years since quitting: 21.2   Smokeless tobacco: Never  Vaping Use   Vaping status: Never Used  Substance Use Topics   Alcohol use: Yes    Alcohol/week: 7.0 standard drinks of alcohol    Types: 7 Cans of beer per week   Drug use: Yes    Frequency: 3.0 times per week    Types: Marijuana    Comment: occ   Allergies Etomidate  Review of Systems Review of Systems  Physical Exam Vital Signs  I have reviewed the triage vital signs BP (!) 136/100   Pulse 79   Temp 97.6 F (36.4 C)   Resp (!) 21   Ht 5' 11.5" (1.816 m)   Wt 79.4 kg   SpO2 99%   BMI 24.07 kg/m   Physical Exam Vitals reviewed.  Constitutional:      Appearance: He is not ill-appearing.  Eyes:     Pupils: Pupils are equal, round, and reactive to light.  Cardiovascular:     Rate and Rhythm: Regular rhythm. Tachycardia present.  Pulmonary:     Effort: Pulmonary effort is normal.     Breath sounds: No decreased breath sounds or wheezing.  Chest:     Chest wall: No mass or tenderness.  Musculoskeletal:     Cervical back: Normal range of motion.  Neurological:     General: No focal deficit present.     Mental Status: He is alert.     ED Results and Treatments Labs (all labs ordered are listed, but only abnormal results are displayed) Labs Reviewed  BASIC METABOLIC PANEL - Abnormal; Notable for the following components:      Result Value   CO2 19 (*)    Glucose, Bld 152 (*)    Creatinine, Ser 1.67 (*)    GFR, Estimated 45 (*)    All other components within normal limits  BRAIN NATRIURETIC PEPTIDE - Abnormal; Notable for the following components:   B Natriuretic Peptide 1,002.6 (*)    All other components within normal limits  D-DIMER, QUANTITATIVE - Abnormal; Notable for the following components:   D-Dimer, Quant 0.81 (*)    All other components within normal limits   LACTIC ACID,  PLASMA - Abnormal; Notable for the following components:   Lactic Acid, Venous 2.8 (*)    All other components within normal limits  TROPONIN I (HIGH SENSITIVITY) - Abnormal; Notable for the following components:   Troponin I (High Sensitivity) 92 (*)    All other components within normal limits  TROPONIN I (HIGH SENSITIVITY) - Abnormal; Notable for the following components:   Troponin I (High Sensitivity) 82 (*)    All other components within normal limits  RESP PANEL BY RT-PCR (RSV, FLU A&B, COVID)  RVPGX2  CULTURE, BLOOD (ROUTINE X 2)  CULTURE, BLOOD (ROUTINE X 2)  CBC  TSH  LACTIC ACID, PLASMA  HEPARIN LEVEL (UNFRACTIONATED)  HEPATIC FUNCTION PANEL  LDL CHOLESTEROL, DIRECT  LIPOPROTEIN A (LPA)  TYPE AND SCREEN                                                                                                                          Radiology DG Chest Portable 1 View  Result Date: 09/10/2023 CLINICAL DATA:  Cough.  Shortness of breath. EXAM: PORTABLE CHEST 1 VIEW COMPARISON:  06/16/2021. FINDINGS: Bilateral lung fields are clear. Elevated left hemidiaphragm seen. Bilateral costophrenic angles are clear. Normal cardio-mediastinal silhouette. Sternotomy wires noted. No acute osseous abnormalities. The soft tissues are within normal limits. IMPRESSION: *No active disease. Electronically Signed   By: Jules Schick M.D.   On: 09/10/2023 09:31    Pertinent labs & imaging results that were available during my care of the patient were reviewed by me and considered in my medical decision making (see MDM for details).  Medications Ordered in ED Medications  amiodarone (NEXTERONE) 1.8 mg/mL load via infusion 150 mg (150 mg Intravenous Bolus from Bag 09/10/23 0916)    Followed by  amiodarone (NEXTERONE PREMIX) 360-4.14 MG/200ML-% (1.8 mg/mL) IV infusion (60 mg/hr Intravenous New Bag/Given 09/10/23 1254)    Followed by  amiodarone (NEXTERONE PREMIX) 360-4.14 MG/200ML-% (1.8 mg/mL)  IV infusion (has no administration in time range)  heparin ADULT infusion 100 units/mL (25000 units/267mL) (1,100 Units/hr Intravenous New Bag/Given 09/10/23 1342)  furosemide (LASIX) injection 60 mg (has no administration in time range)  heparin bolus via infusion 4,000 Units (4,000 Units Intravenous Bolus from Bag 09/10/23 1343)  adenosine (ADENOCARD) 6 MG/2ML injection 6 mg (6 mg Intravenous Given 09/10/23 1236)  adenosine (ADENOCARD) 6 MG/2ML injection 12 mg (12 mg Intravenous Given 09/10/23 1238)  lidocaine (cardiac) 100 mg/46mL (XYLOCAINE) injection 2% 100 mg (100 mg Intravenous Given 09/10/23 1244)  etomidate (AMIDATE) injection (10 mg Intravenous Given 09/10/23 1249)  Procedures .Critical Care  Performed by: Arletha Pili, DO Authorized by: Arletha Pili, DO   Critical care provider statement:    Critical care time (minutes):  75   Critical care was necessary to treat or prevent imminent or life-threatening deterioration of the following conditions:  Cardiac failure   Critical care was time spent personally by me on the following activities:  Development of treatment plan with patient or surrogate, discussions with consultants, evaluation of patient's response to treatment, examination of patient, ordering and review of laboratory studies, ordering and review of radiographic studies, ordering and performing treatments and interventions, pulse oximetry, re-evaluation of patient's condition and review of old charts   Care discussed with: admitting provider   .Cardioversion  Date/Time: 09/10/2023 1:40 PM  Performed by: Arletha Pili, DO Authorized by: Arletha Pili, DO   Consent:    Consent obtained:  Verbal   Consent given by:  Patient   Risks discussed:  Cutaneous burn, death, induced arrhythmia and pain   Alternatives discussed:  Rate-control  medication and no treatment Pre-procedure details:    Cardioversion basis:  Emergent   Rhythm:  Ventricular tachycardia   Electrode placement:  Anterior-posterior Patient sedated: Yes. Refer to sedation procedure documentation for details of sedation.  Attempt one:    Cardioversion mode:  Synchronous   Waveform:  Biphasic   Shock (Joules):  200   Shock outcome:  Conversion to normal sinus rhythm Post-procedure details:    Patient status:  Awake   Patient tolerance of procedure:  Tolerated with difficulty Comments:     Patient did not tolerate etomidate well.  .Sedation  Date/Time: 09/10/2023 1:41 PM  Performed by: Arletha Pili, DO Authorized by: Arletha Pili, DO   Consent:    Consent obtained:  Verbal   Consent given by:  Patient   Risks discussed:  Allergic reaction, prolonged hypoxia resulting in organ damage, vomiting, nausea and inadequate sedation   Alternatives discussed:  Analgesia without sedation and anxiolysis Universal protocol:    Immediately prior to procedure, a time out was called: yes   Indications:    Procedure performed:  Cardioversion Pre-sedation assessment:    Time since last food or drink:  12   ASA classification: class 2 - patient with mild systemic disease     Mouth opening:  3 or more finger widths   Thyromental distance:  4 finger widths   Mallampati score:  I - soft palate, uvula, fauces, pillars visible   Neck mobility: normal     Pre-sedation assessments completed and reviewed: airway patency, hydration status, mental status, nausea/vomiting, respiratory function and temperature     Pre-sedation assessments completed and reviewed: pre-procedure cardiovascular function not reviewed   A pre-sedation assessment was completed prior to the start of the procedure Immediate pre-procedure details:    Reassessment: Patient reassessed immediately prior to procedure     Reviewed: vital signs and NPO status     Verified: bag valve mask  available, emergency equipment available, intubation equipment available, IV patency confirmed, oxygen available, reversal medications available and suction available   Procedure details (see MAR for exact dosages):    Preoxygenation:  Nasal cannula   Sedation:  Etomidate   Intended level of sedation: deep   Analgesia:  None   Intra-procedure monitoring:  Blood pressure monitoring, continuous capnometry, cardiac monitor and continuous pulse oximetry   Intra-procedure events: none     Total Provider sedation time (minutes):  15 Post-procedure details:   A post-sedation  assessment was completed following the completion of the procedure.   Attendance: Constant attendance by certified staff until patient recovered     Recovery: Patient returned to pre-procedure baseline     Patient is stable for discharge or admission: yes     Procedure completion:  Tolerated with difficulty Comments:     Would not recommend etomidate for the patient.  Patient very confused, initially yelling, different from myoclonus reaction.  Return to baseline after 5 minutes.   (including critical care time)  Medical Decision Making / ED Course   This patient presents to the ED for concern of rapid heart rate, this involves an extensive number of treatment options, and is a complaint that carries with it a high risk of complications and morbidity.  The differential diagnosis includes tachycardia, supraventricular tachycardia, ventricular tachycardia, PE, ischemia.  MDM: Patient overall looks well.  He is not in any significant distress, however his vital signs are obviously unnerving.  He has a heart rate in the 160s.  Is a wide-complex tachycardia.  I went through the patient's previous EKGs, he does have frequent PVCs, and also has a right bundle branch block.  Unclear if this is a sinus tachycardia with bundle branch block, possibly a supraventricular tachycardia, possibly V. tach.  Patient has a normal blood pressure  at this time.  Will attempt medical management.  To perform bedside ultrasound which did show flat RV, do of lower suspicion for PE.  No risk factors for PE at this time.  Reassessment 1:20 PM-patient's heart rate remained in the 160s definition and him on amiodarone push.  Rechecked cardiology and EP.  There is considerable discussion as to whether or not this was a supraventricular tachycardia or V. tach.  Decision was made in conjunction with the EP physician Dr. Lalla Brothers, try the patient on adenosine.  Adenosine did not of effect x 2.  Patient then received 1 mg/kg of lidocaine without effect.  Patient was then cardioverted.  Informed consent obtained from the patient prior to procedure.  Patient converted to sinus rhythm.  Patient did have an intolerance to etomidate, would recommend not using etomidate on the patient in the future.  Patient is going to be admitted to CCU.  Likely going to Cath Lab today.   Additional history obtained: -Additional history obtained from wife at bedside -External records from outside source obtained and reviewed including: Chart review including previous notes, labs, imaging, consultation notes   Lab Tests: -I ordered, reviewed, and interpreted labs.   The pertinent results include:   Labs Reviewed  BASIC METABOLIC PANEL - Abnormal; Notable for the following components:      Result Value   CO2 19 (*)    Glucose, Bld 152 (*)    Creatinine, Ser 1.67 (*)    GFR, Estimated 45 (*)    All other components within normal limits  BRAIN NATRIURETIC PEPTIDE - Abnormal; Notable for the following components:   B Natriuretic Peptide 1,002.6 (*)    All other components within normal limits  D-DIMER, QUANTITATIVE - Abnormal; Notable for the following components:   D-Dimer, Quant 0.81 (*)    All other components within normal limits  LACTIC ACID, PLASMA - Abnormal; Notable for the following components:   Lactic Acid, Venous 2.8 (*)    All other components within  normal limits  TROPONIN I (HIGH SENSITIVITY) - Abnormal; Notable for the following components:   Troponin I (High Sensitivity) 92 (*)    All other components within normal  limits  TROPONIN I (HIGH SENSITIVITY) - Abnormal; Notable for the following components:   Troponin I (High Sensitivity) 82 (*)    All other components within normal limits  RESP PANEL BY RT-PCR (RSV, FLU A&B, COVID)  RVPGX2  CULTURE, BLOOD (ROUTINE X 2)  CULTURE, BLOOD (ROUTINE X 2)  CBC  TSH  LACTIC ACID, PLASMA  HEPARIN LEVEL (UNFRACTIONATED)  HEPATIC FUNCTION PANEL  LDL CHOLESTEROL, DIRECT  LIPOPROTEIN A (LPA)  TYPE AND SCREEN      EKG my independent review shows wide-complex tachycardia  EKG Interpretation Date/Time:    Ventricular Rate:    PR Interval:    QRS Duration:    QT Interval:    QTC Calculation:   R Axis:      Text Interpretation:           Imaging Studies ordered: I ordered imaging studies including chest x-ray I independently visualized and interpreted imaging. I agree with the radiologist interpretation   Medicines ordered and prescription drug management: Meds ordered this encounter  Medications   FOLLOWED BY Linked Order Group    amiodarone (NEXTERONE) 1.8 mg/mL load via infusion 150 mg    amiodarone (NEXTERONE PREMIX) 360-4.14 MG/200ML-% (1.8 mg/mL) IV infusion    amiodarone (NEXTERONE PREMIX) 360-4.14 MG/200ML-% (1.8 mg/mL) IV infusion   adenosine (ADENOCARD) 6 MG/2ML injection    Montee, William L: cabinet override   heparin bolus via infusion 4,000 Units   heparin ADULT infusion 100 units/mL (25000 units/257mL)   adenosine (ADENOCARD) 6 MG/2ML injection 6 mg   adenosine (ADENOCARD) 6 MG/2ML injection 12 mg   furosemide (LASIX) injection 60 mg   lidocaine (cardiac) 100 mg/59mL (XYLOCAINE) injection 2% 100 mg   etomidate (AMIDATE) injection    -I have reviewed the patients home medicines and have made adjustments as needed  Critical interventions Management of  ventricular tachycardia  Consultations Obtained: I requested consultation with the electrophysiology,  and discussed lab and imaging findings as well as pertinent plan - they recommend: Medications followed by cardioversion   Cardiac Monitoring: The patient was maintained on a cardiac monitor.  I personally viewed and interpreted the cardiac monitored which showed an underlying rhythm of: Ventricular tachycardia subsequently sinus rhythm  Social Determinants of Health:  Factors impacting patients care include:    Reevaluation: After the interventions noted above, I reevaluated the patient and found that they have :improved  Co morbidities that complicate the patient evaluation  Past Medical History:  Diagnosis Date   Arthritis    knees, hands   Cataract    Coronary artery disease    Dysrhythmia 06/2014   sinus brady with PVC'S   GERD (gastroesophageal reflux disease)    Hepatitis C    Hx of adenomatous colonic polyps 09/21/2014   Hypercholesteremia    Hypertension    Myocardial infarction [facility])       Dispostion: Admission to CCU     Final Clinical Impression(s) / ED Diagnoses Final diagnoses:  Ventricular tachycardia (HCC)     @PCDICTATION @    Anders Simmonds T, DO 09/10/23 1344

## 2023-09-10 NOTE — Sedation Documentation (Signed)
Pt cardioverted with 200J synchronized

## 2023-09-10 NOTE — H&P (View-Only) (Signed)
Cardiology Consultation   Patient ID: Nicholas [name] MRN: [id number]; DOB: 08-Sep-1956  Admit date: 09/10/2023 Date of Consult: 09/10/2023  PCP:  Irven Coe, MD   [facility] Providers Cardiologist:  Donato Schultz, MD  Electrophysiologist:  Lanier Prude, MD  {    Patient Profile:   Nicholas [name] is a 67 y.o. male with a hx of SVT, CAD s/p CABG 2003, ischemic cardiomyopathy, chronic systolic and diastolic heart failure, NSVT, HTN, HLD, CKD III, basal inferior aneurysm within the LV , PVCs, who is being seen 09/10/2023 for the evaluation of wide complex tachycardia at the request of Dr Andria Meuse.  History of Present Illness:   Nicholas [name] with above PMH presented to ER c/o heart palpitation and SOB.  Patient states he started having intermittent heart palpitation Thursday evening.  He felt his heart is racing and beating irregularly.  He felt short of breath with exertion with these episodes. He identifies no specific trigger of these episodes.  He noted each time symptom last several minutes and resolved spontaneously.  He denied ever having any significant chest pain or pressure, dizziness, or syncope.  Yesterday, he felt significantly winded and could not get himself to work.  Therefore he came to the ER today for further evaluation.  He felt overall improved while sitting at ED. He denied any significant leg edema, rapid weight gain.   Admission diagnostic so far revealed bicarb 19, glucose 152, creatinine 1.67, GFR 45.  BNP 1002.  High sensitive troponin 92 >82..  Lactic acid 2.8.  CBC grossly unremarkable.  D-dimer evaded 0.81.  TSH WNL.  Respiratory viral screen negative.  Blood culture sent.  Chest x-ray revealed no acute disease. EKG revealed wide-complex tachycardia 165 bpm, RBBB pattern, either AVNRT or VT. He was placed on amiodarone gtt at ED, heart rate slowed to 145bpm. Cardiology is consulted for further evaluation  Per chart review, he follows Dr Anne Fu outpatient,  has CAD and underwent CABG in 2003. He was hospitalized 06/2021 for non-STEMI with troponin peaked 292. LHC from 06/19/2021 revealing left dominant coronary anatomy, widely patent left main, widely patent LAD but was total occlusion of large first diagonal, total occlusion of distal circumflex which supplied the PDA, total occlusion of large early rising first OM, no selective engagement of the presumed nondominant right coronary with possible, faint opacification of small conus branch could be seen, similar to 2003, large inferior basal aneurysm, EF less than 50%, LVEDP normal, atretic LIMA to LAD,60% ostial narrowing of the saphenous vein graft to the first obtuse marginal, widely patent saphenous vein graft to first diagonal.  He was recommended GDMT for systolic dysfunction and EP evaluation for NSVT and PVCs. He was seen by EP, felt patient's NSVT was ischemic in nature and related to basal inferior aneurysm.  He was recommended MRI for evaluation of LV scar burden and LV function. Cardiac MRI from 06/21/2021 sowed LGE and myocardial thinning with normal T2 signal: Lack of edema and subendocardial pattern are more consistent with ischemic disease than sarcoidosis.  LVEF 42%.  RVEF 32%. Abnormal basal RV function without aneurysm.  ZIO monitor from 08/30/2021 revealing 1 SVT lasting 10 beats, likely atrial tachycardia, rare supraventricular ectopy, occasional ventricular ectopy 1.2%, no sustained arrhythmias. He followed up with Dr. Lalla Brothers in the office 10/05/21, recommended to continue amiodarone 100 mg daily along with long-acting metoprolol for NSVT/PVCs.  Most recent echo from 07/03/2022 LVEF 50 to 55%, no regional motion abnormality, moderate LVH, grade 1  DD, normal RV, PASP 18.8 mmHg, trivial MR. He was last seen by Dr. Anne Fu on 06/28/2023, doing well, was advised to continue aspirin, metoprolol, losartan, Crestor for CAD/CHF, tolerating amiodarone without issues.  Past Medical History:  Diagnosis Date    Arthritis    knees, hands   Cataract    Coronary artery disease    Dysrhythmia 06/2014   sinus brady with PVC'S   GERD (gastroesophageal reflux disease)    Hepatitis C    Hx of adenomatous colonic polyps 09/21/2014   Hypercholesteremia    Hypertension    Myocardial infarction [facility])     Past Surgical History:  Procedure Laterality Date   CARDIAC CATHETERIZATION     CARDIAC SURGERY     triple bypass   COLONOSCOPY     CORONARY ARTERY BYPASS GRAFT     CABG x 3 Vessels   LEFT HEART CATH AND CORS/GRAFTS ANGIOGRAPHY N/A 06/19/2021   Procedure: LEFT HEART CATH AND CORS/GRAFTS ANGIOGRAPHY;  Surgeon: Lyn Records, MD;  Location: [facility] INVASIVE CV LAB;  Service: Cardiovascular;  Laterality: N/A;     Home Medications:  Prior to Admission medications   Medication Sig Start Date End Date Taking? Authorizing Provider  alfuzosin (UROXATRAL) 10 MG 24 hr tablet Take 1 tablet (10 mg total) by mouth daily. 02/05/23     alfuzosin (UROXATRAL) 10 MG 24 hr tablet Take 1 tablet (10 mg total) by mouth daily immediately after meal for 90 days. 08/06/23     amiodarone (PACERONE) 100 MG tablet Take 1 tablet (100 mg total) by mouth daily. 08/05/23   Jake Bathe, MD  aspirin EC 81 MG tablet Take 81 mg by mouth daily with breakfast.    [provider]  docusate sodium (COLACE) 100 MG capsule Take 100 mg by mouth every evening.    [provider]  losartan (COZAAR) 25 MG tablet Take 1 tablet (25 mg total) by mouth at bedtime. 04/24/23   Jake Bathe, MD  metoprolol succinate (TOPROL XL) 25 MG 24 hr tablet Take 1 tablet (25 mg total) by mouth daily. 10/04/22   Jake Bathe, MD  nitroGLYCERIN (NITROSTAT) 0.4 MG SL tablet Place 1 tablet (0.4 mg total) under the tongue every 5 (five) minutes x 3 doses as needed for chest pain. 02/27/23   Jake Bathe, MD  rosuvastatin (CRESTOR) 40 MG tablet Take 1 tablet (40 mg total) by mouth daily at 6 PM. 07/29/23   Jake Bathe, MD    Inpatient  Medications: Scheduled Meds:  [START ON 09/11/2023] aspirin  81 mg Oral Pre-Cath   furosemide  60 mg Intravenous Once   Continuous Infusions:  sodium chloride     amiodarone 60 mg/hr (09/10/23 1254)   Followed by   amiodarone     heparin 1,100 Units/hr (09/10/23 1342)   PRN Meds:   Allergies:    Allergies  Allergen Reactions   Etomidate Other (See Comments)    Intolerance    Social History:   Social History   Socioeconomic History   Marital status: Married    Spouse name: Not on file   Number of children: Not on file   Years of education: 11   Highest education level: 11th grade  Occupational History   Not on file  Tobacco Use   Smoking status: Former    Current packs/day: 0.00    Types: Cigarettes    Quit date: 06/10/2002    Years since quitting: 21.2   Smokeless tobacco: Never  Vaping Use   Vaping status: Never Used  Substance and Sexual Activity   Alcohol use: Yes    Alcohol/week: 7.0 standard drinks of alcohol    Types: 7 Cans of beer per week   Drug use: Yes    Frequency: 3.0 times per week    Types: Marijuana    Comment: occ   Sexual activity: Yes  Other Topics Concern   Not on file  Social History Narrative   Not on file   Social Determinants of Health   Financial Resource Strain: Not on file  Food Insecurity: Not on file  Transportation Needs: Not on file  Physical Activity: Not on file  Stress: Not on file  Social Connections: Not on file  Intimate Partner Violence: Not on file    Family History:    Family History  Problem Relation Age of Onset   Diabetic kidney disease Mother    Kidney disease Mother    Throat cancer Mother    Cancer Mother    Diabetes Mother    Hypertension Mother    Esophageal cancer Mother    Other Father        unknown   Hypertension Father    Heart attack Neg Hx    Colon cancer Neg Hx    Stomach cancer Neg Hx    Rectal cancer Neg Hx    Colon polyps Neg Hx      ROS:  Constitutional: Denied fever,  chills, malaise, night sweats Eyes: Denied vision change or loss Ears/Nose/Mouth/Throat: Denied ear ache, sore throat, coughing, sinus pain Cardiovascular: see HPI  Respiratory: see HPI  Gastrointestinal: Denied nausea, vomiting, abdominal pain, diarrhea Genital/Urinary: Denied dysuria, hematuria, urinary frequency/urgency Musculoskeletal: Denied muscle ache, joint pain, weakness Skin: Denied rash, wound Neuro: Denied headache, dizziness, syncope Psych: Denied history of depression/anxiety  Endocrine: Denied history of diabetes     Physical Exam/Data:   Vitals:   09/10/23 1245 09/10/23 1250 09/10/23 1255 09/10/23 1300  BP:   (!) 178/106 (!) 136/100  Pulse:  (!) 144  79  Resp:  (!) 21 (!) 28 (!) 21  Temp: 97.6 F (36.4 C)     SpO2:  100%  99%  Weight:      Height:       No intake or output data in the 24 hours ending 09/10/23 1347    09/10/2023    8:31 AM 06/28/2023    3:54 PM 12/19/2022    4:24 PM  Last 3 Weights  Weight (lbs) 175 lb 183 lb 6.4 oz 185 lb  Weight (kg) 79.379 kg 83.19 kg 83.915 kg     Body mass index is 24.07 kg/m.   Vitals:  Vitals:   09/10/23 1255 09/10/23 1300  BP: (!) 178/106 (!) 136/100  Pulse:  79  Resp: (!) 28 (!) 21  Temp:    SpO2:  99%   General Appearance: In no apparent distress, laying in bed HEENT: Normocephalic, atraumatic.  Neck: Supple, trachea midline, no JVDs Cardiovascular: Regular rhythm, tachycardic, normal S1-S2, no murmur Respiratory: Resting breathing unlabored, lungs sounds clear to auscultation bilaterally, no use of accessory muscles. On room air.  Speak full sentence. Gastrointestinal: Bowel sounds positive, abdomen soft, non-tender, non-distended.  Extremities: Able to move all extremities in bed without difficulty, no edema bilateral lower extremity  Musculoskeletal: Normal muscle bulk and tone Skin: Intact, warm, dry.  Neurologic: Alert, oriented to person, place and time. no gross focal neuro deficit Psychiatric:  Normal affect. Mood is appropriate.  EKG:  The EKG was personally reviewed and demonstrates:    EKG today revealed wide-complex tachycardia 165 bpm, RBBB pattern, suggestive of AVRT but VT cannot be ruled out.  Telemetry:  Telemetry was personally reviewed and demonstrates:    Wide-complex tachycardia 145bpm    Relevant CV Studies:  Echocardiogram 07/03/2022:   1. Left ventricular ejection fraction, by estimation, is 50 to 55%. Left  ventricular ejection fraction by 3D volume is 50 %. The left ventricle has  low normal function. The left ventricle has no regional wall motion  abnormalities. There is moderate left   ventricular hypertrophy. Left ventricular diastolic parameters are  consistent with Grade I diastolic dysfunction (impaired relaxation).   2. Right ventricular systolic function is normal. The right ventricular  size is normal. There is normal pulmonary artery systolic pressure. The  estimated right ventricular systolic pressure is 18.8 mmHg.   3. The mitral valve is normal in structure. Trivial mitral valve  regurgitation. No evidence of mitral stenosis.   4. The aortic valve is tricuspid. Aortic valve regurgitation is not  visualized. No aortic stenosis is present.   5. The inferior vena cava is normal in size with greater than 50%  respiratory variability, suggesting right atrial pressure of 3 mmHg.   Comparison(s): Prior images reviewed side by side. Prior EF 45-50%.    ZIO monitor from 08/30/2021:  HR 40 - 139 bpm, average 56 bpm. 1 SVT lasting 10 beats, likely atrial tachycardia. Rare supraventricular ectopy.  Occasional ventricular ectopy, 1.2%. No sustained arrhythmias.   Sheria Lang T. Lalla Brothers, MD, [facility], [facility] Cardiac Electrophysiology   Cardiac MRI from 06/21/2021:  FINDINGS: 1. Normal left ventricular size, with LVEDD 43 mm, and LVEDVi 67 mL/m2.   Normal left ventricular thickness, with intraventricular septal thickness of 8 mm, posterior wall  thickness of 9 mm, and septal to posterior ratio < 1.5.   Myocardial mass index normal at 70 g/m2.   Mildly reduced left ventricular systolic function (LVEF =42%). There is inferior and inferoseptal dyskinesis.   Left ventricular parametric mapping notable for significant elevated in the ECV signal in the areas of dyskinesis (basal inferior 71% with increase inferolateral ECV of 49% base and 44% mid) and normal T2 signal.   There is late gadolinium enhancement in the left ventricular myocardium. Transmural in the inferoseptal and inferior base with associated thinning, there is up to 50% subendocardial LGE in the inferolateral based to mid.   2. Normal right ventricular size with RVEDVI 80 mL/m2.   Normal right ventricular thickness.   Moderate right ventricular systolic dysfunction (RVEF =32%). Abnormal basal RV function without aneurysm.   3.  Normal left and right atrial size.   4. Normal size of the aortic root, ascending aorta and pulmonary artery.   5.  Valve disease as below.   Tri-leaflet aortic valve. Not suggestive of significant aortic stenosis, mild to moderate regurgitation by regurgitation fraction (16%), though this may be overestimated in the setting of notable artifact.   Pulmonary valve not suggestive of significant stenosis or regurgitation   Tricuspid valve with qualitatively mild regurgitation.   Mitral valve is central and mild based on LV to RV comparison (given flow related artifact).   6.  Normal pericardium.  No pericardial effusion.   7. Grossly, no extracardiac findings. Recommended dedicated study if concerned for non-cardiac pathology.   8. Notable PVCs during procedure decreases accuracy of volumetric assessment and valve quantification. There is artifact related to sternotomy wires. Breath hold artifact during flow  acquisitions.   IMPRESSION: LGE and myocardial thinning with normal T2 signal: Lack of edema and subendocardial  pattern are more consistent with ischemic disease than sarcoidosis.   Functional assessment limited in the setting of breath hold artifact and PVCs.   Left heart catheterization 06/19/2021:    Left dominant coronary anatomy   Widely patent left main   Widely patent LAD but with total occlusion of a large first diagonal   Total occlusion of the distal circumflex which supplied the PDA.  There is total occlusion of the large early arising first obtuse marginal.   No selective engagement of the presumed nondominant right coronary was possible.  Faint opacification of a small conus branch could be seen.  This is the same situation noted in 2003 prior to coronary bypass surgery.   Left ventriculography by hand-injection reveals a large inferobasal aneurysm.  EF less than 50%.  LVEDP is normal.   Atretic LIMA to LAD   60% ostial narrowing of the saphenous vein graft to the first obtuse marginal   Widely patent saphenous vein graft to first diagonal   Recommendations Guideline directed therapy for systolic dysfunction. Further evaluation as needed relative to ventricular arrhythmia per EP.    Laboratory Data:  High Sensitivity Troponin:   Recent Labs  Lab 09/10/23 0858 09/10/23 1045  TROPONINIHS 92* 82*     Chemistry Recent Labs  Lab 09/10/23 0858  NA 142  K 4.0  CL 109  CO2 19*  GLUCOSE 152*  BUN 21  CREATININE 1.67*  CALCIUM 9.0  GFRNONAA 45*  ANIONGAP 14    No results for input(s): "PROT", "ALBUMIN", "AST", "ALT", "ALKPHOS", "BILITOT" in the last 168 hours. Lipids No results for input(s): "CHOL", "TRIG", "HDL", "LABVLDL", "LDLCALC", "CHOLHDL" in the last 168 hours.  Hematology Recent Labs  Lab 09/10/23 0858  WBC 8.5  RBC 5.00  HGB 14.0  HCT 41.8  MCV 83.6  MCH 28.0  MCHC 33.5  RDW 14.7  PLT 161   Thyroid  Recent Labs  Lab 09/10/23 0858  TSH 2.767    BNP Recent Labs  Lab 09/10/23 0858  BNP 1,002.6*    DDimer  Recent Labs  Lab 09/10/23 0858   DDIMER 0.81*     Radiology/Studies:  DG Chest Portable 1 View  Result Date: 09/10/2023 CLINICAL DATA:  Cough.  Shortness of breath. EXAM: PORTABLE CHEST 1 VIEW COMPARISON:  06/16/2021. FINDINGS: Bilateral lung fields are clear. Elevated left hemidiaphragm seen. Bilateral costophrenic angles are clear. Normal cardio-mediastinal silhouette. Sternotomy wires noted. No acute osseous abnormalities. The soft tissues are within normal limits. IMPRESSION: *No active disease. Electronically Signed   By: Jules Schick M.D.   On: 09/10/2023 09:31     Assessment and Plan:   Wide-complex tachycardia likely SVT - Presented with intermittent heart palpitation and shortness of breath since Thursday, 09/05/23 - TSH WNL  - EKG and telemetry revealing wide-complex tachycardia  - Agree with IV amiodarone gtt for now, continue PTA Toprol XL 25 mg daily  - EP consult has been requested today, appreciate   Acute on chronic systolic and diastolic heart failure -Complaining shortness of breath with exertion since Thursday - BNP 1002 - CXR no acute finding -Clinically euvolemic on exam  -Suspect mild/early decompensated CHF due to tachyarrhythmia, aim for rate control, attempt IV Lasix 60 mg x 1, lactic acidosis resolved, will add LFTs to elevate perfusion.  -plan to repeat echo once heart rate is improved -GDMT: Continue PTA Toprol-XL 25 mg daily, hold  PTA losartan 25 mg daily in the setting of AKI  CAD with history of CABG 2003 -High send troponin 92 >82 -EKG nondiagnostic -Cath from 06/2021 with stable disease -He denies any chest pain -Suspect this is demand in the setting of tachyarrhythmia -Continue medical therapy with aspirin 81 mg daily, Crestor 40 mg daily, Toprol-XL 25 mg daily, hold losartan  AKI on CKD stage III Elevated D-dimer Lactic acidosis -Defer to internal medicine for management   Risk Assessment/Risk Scores:   New York Heart Association (NYHA) Functional Class NYHA Class  II  For questions or updates, please contact [facility] Please consult [email or web address] for contact info under    Signed, Cyndi Bender, NP  09/10/2023 10:00 AM   ADDENDUM:   Patient seen and examined with Cyndi Bender, NP.  I personally taken a history, examined the patient, reviewed relevant notes,  laboratory data / imaging studies.  I performed a substantive portion of this encounter and formulated the important aspects of the plan.  I agree with the APP's note, impression, and recommendations; however, I have edited the note to reflect changes or salient points.  Patient presents to the hospital with complaints of palpitations and progressive shortness of breath.  The symptoms started on Thanksgiving day 08/28/2023 and has been getting progressively worse.  Patient noted to be in wide-complex tachycardia upon arrival to the emergency room department.  Cardiology consulted for further evaluation and management.  By the time I saw the patient in room ER 26 he had already undergone direct-current cardioversion for wide-complex tachycardia.  Discussion with EP confirms initial rhythm was ventricular tachycardia.  He was given adenosine 6 mg followed by 12 mg as well as lidocaine.  Subsequently underwent direct-current cardioversion with 200 J x 1 and now in normal sinus rhythm.  Patient is awake and alert.  He denies anginal chest pain or heart failure symptoms.  No near-syncope or syncopal events.  Patient's been taking his medications regularly.   PHYSICAL EXAM: Today's Vitals   09/10/23 1245 09/10/23 1250 09/10/23 1255 09/10/23 1300  BP:   (!) 178/106 (!) 136/100  Pulse:  (!) 144  79  Resp:  (!) 21 (!) 28 (!) 21  Temp: 97.6 F (36.4 C)     SpO2:  100%  99%  Weight:      Height:      PainSc:       Body mass index is 24.07 kg/m.   Net IO Since Admission: No IO data has been entered for this period [09/10/23 1347]  Filed Weights   09/10/23 0831  Weight: 79.4 kg     Physical Exam  Constitutional: No distress.  hemodynamically stable, age-appropriate, well-built  Neck: No JVD present.  Cardiovascular: Normal rate, regular rhythm, S1 normal and S2 normal. Exam reveals no gallop, no S3 and no S4.  No murmur heard. Pulmonary/Chest: Effort normal and breath sounds normal. No stridor. He has no wheezes. He has no rales.  Abdominal: Soft. Bowel sounds are normal. He exhibits no distension. There is no abdominal tenderness.  Musculoskeletal:        General: No edema.     Cervical back: Neck supple.  Neurological: He is alert and oriented to person, place, and time. He has intact cranial nerves (2-12).  Skin: Skin is warm.    EKG: (personally reviewed by me) 09/10/2023: Wide-complex tachycardia, 165 bpm, left axis, atypical right bundle, aVR positive deflection, concerning for ventricular tachycardia.   09/10/2023: Sinus rhythm, 76 bpm, TWI in  the inferolateral leads concerning for ischemia.    Telemetry: (personally reviewed by me) At the time of evaluation sinus rhythm without ectopy   Impression:  Wide-complex tachycardia-consistent with ventricular tachycardia Dyspnea on exertion. Palpitation History of nonsustained ventricular tachycardia and PVCs. Long-term antiarrhythmic medications Coronary artery disease without angina pectoris, status post CABG Acute renal insufficiency -no recent baseline creatinine Benign essential hypertension. Hyperlipidemia.  Recommendations:  Patient presented to the hospital due to shortness of breath and palpitations ongoing since 08/28/2023.  Symptoms were progressive.  Initial EKG noted wide-complex tachycardia initially felt to be AVNRT but after discussion with EP consistent with ventricular tachycardia given the atypical right bundle branch morphology, A-V dissociation, and positive aVR.  Under EP guidance patient received adenosine 6 mg followed by 12 mg.  Lidocaine followed by direct-current  cardioversion 200 J x 1.  Currently normal sinus rhythm.  Post cardioversion EKG notes sinus rhythm with diffuse inferior and lateral T wave inversions concerning for ischemia.  He has had known history of nonsustained ventricular tachycardia and PVCs likely mediated by ischemia in the past.  He is undergone heart catheterization cardiac MRI, and recent echocardiography.  He will need to have repeat ischemic workup during his hospitalization (echo and heart heart catheterization), plus or minus repeat cardiac MRI per EP recommendations, and to be considered for ICD implant for primary prevention depending on the work up.  VT management per EP team - appreciate their recommendations.   Agree with IV Lasix for volume management, strict I's and O's, daily weights Continue ASA 81mg  po day and statin  Check fasting lipids, Lpa, A1c.  Will need to uptitrate GDMT as renal function and hemodynamics allow.  EP plans to admit and general cardiology will follow with you.   Further recommendations to follow as the case evolves.   This note was created using a voice recognition software as a result there may be grammatical errors inadvertently enclosed that do not reflect the nature of this encounter. Every attempt is made to correct such errors.   Tessa Lerner, DO, [facility]  [address] [place], [place] [address] Pager: [phone] Office: [phone] 09/10/2023 1:47 PM

## 2023-09-11 ENCOUNTER — Encounter (HOSPITAL_COMMUNITY): Payer: Self-pay | Admitting: Cardiology

## 2023-09-11 ENCOUNTER — Inpatient Hospital Stay (HOSPITAL_COMMUNITY): Payer: Commercial Managed Care - PPO

## 2023-09-11 DIAGNOSIS — I2510 Atherosclerotic heart disease of native coronary artery without angina pectoris: Secondary | ICD-10-CM | POA: Diagnosis not present

## 2023-09-11 LAB — LIPOPROTEIN A (LPA): Lipoprotein (a): 89.8 nmol/L — ABNORMAL HIGH (ref <75.0)

## 2023-09-11 LAB — LIPID PANEL
Cholesterol: 96 mg/dL (ref 0–200)
HDL: 44 mg/dL (ref >40)
LDL Cholesterol: 41 mg/dL (ref 0–99)
Total CHOL/HDL Ratio: 2.2 {ratio}
Triglycerides: 56 mg/dL (ref <150)
VLDL: 11 mg/dL (ref 0–40)

## 2023-09-11 LAB — BASIC METABOLIC PANEL
Anion gap: 7 (ref 5–15)
BUN: 16 mg/dL (ref 8–23)
CO2: 23 mmol/L (ref 22–32)
Calcium: 8.6 mg/dL — ABNORMAL LOW (ref 8.9–10.3)
Chloride: 109 mmol/L (ref 98–111)
Creatinine, Ser: 1.41 mg/dL — ABNORMAL HIGH (ref 0.61–1.24)
GFR, Estimated: 55 mL/min — ABNORMAL LOW (ref >60)
Glucose, Bld: 107 mg/dL — ABNORMAL HIGH (ref 70–99)
Potassium: 3.7 mmol/L (ref 3.5–5.1)
Sodium: 139 mmol/L (ref 135–145)

## 2023-09-11 LAB — ECHOCARDIOGRAM COMPLETE
AR max vel: 3.08 cm2
AV Area VTI: 2.92 cm2
AV Area mean vel: 2.8 cm2
AV Mean grad: 3 mm[Hg]
AV Peak grad: 6.4 mm[Hg]
AV Vena cont: 0.35 cm
Ao pk vel: 1.26 m/s
Area-P 1/2: 2.79 cm2
Height: 71.5 in
P 1/2 time: 658 ms
S' Lateral: 3.2 cm
Weight: 2793.67 [oz_av]

## 2023-09-11 LAB — TYPE AND SCREEN
ABO/RH(D): A POS
Antibody Screen: NEGATIVE

## 2023-09-11 LAB — LDL CHOLESTEROL, DIRECT: Direct LDL: 47 mg/dL (ref 0–99)

## 2023-09-11 MED ORDER — POTASSIUM CHLORIDE CRYS ER 20 MEQ PO TBCR
40.0000 meq | EXTENDED_RELEASE_TABLET | Freq: Once | ORAL | Status: AC
  Administered 2023-09-11: 40 meq via ORAL
  Filled 2023-09-11: qty 2

## 2023-09-11 MED ORDER — TAMSULOSIN HCL 0.4 MG PO CAPS
0.4000 mg | ORAL_CAPSULE | Freq: Once | ORAL | Status: AC
  Administered 2023-09-11: 0.4 mg via ORAL
  Filled 2023-09-11: qty 1

## 2023-09-11 MED ORDER — ROSUVASTATIN CALCIUM 20 MG PO TABS
40.0000 mg | ORAL_TABLET | Freq: Every day | ORAL | Status: DC
  Administered 2023-09-11 – 2023-09-14 (×4): 40 mg via ORAL
  Filled 2023-09-11 (×4): qty 2

## 2023-09-11 MED ORDER — MEXILETINE HCL 150 MG PO CAPS
150.0000 mg | ORAL_CAPSULE | Freq: Three times a day (TID) | ORAL | Status: DC
  Administered 2023-09-11 – 2023-09-14 (×10): 150 mg via ORAL
  Filled 2023-09-11 (×12): qty 1

## 2023-09-11 MED ORDER — LOSARTAN POTASSIUM 25 MG PO TABS
25.0000 mg | ORAL_TABLET | Freq: Every day | ORAL | Status: DC
  Administered 2023-09-11 – 2023-09-14 (×4): 25 mg via ORAL
  Filled 2023-09-11 (×4): qty 1

## 2023-09-11 NOTE — Progress Notes (Signed)
   EP Rounding Note    Patient Name: Nicholas [name] Date of Encounter: 09/11/2023  [facility] Cardiologist: Donato Schultz, MD   Subjective   NAEO. Remains in sinus. Feels well. Wife at bedside.  Vital Signs    Vitals:   09/11/23 0430 09/11/23 0500 09/11/23 0530 09/11/23 0604  BP: 103/82 94/71 118/78 122/88  Pulse: 68 67 67 (!) 59  Resp: 17 19 20  (!) 21  Temp:      TempSrc:      SpO2: 98% 96% 99% 97%  Weight:      Height:        Intake/Output Summary (Last 24 hours) at 09/11/2023 0624 Last data filed at 09/11/2023 0600 Gross per 24 hour  Intake 1512.54 ml  Output 2085 ml  Net -572.46 ml      09/10/2023    8:31 AM 06/28/2023    3:54 PM 12/19/2022    4:24 PM  Last 3 Weights  Weight (lbs) 175 lb 183 lb 6.4 oz 185 lb  Weight (kg) 79.379 kg 83.19 kg 83.915 kg      Telemetry    No further VT - Personally Reviewed  Physical Exam   GEN: No acute distress.   Cardiac: RRR, no murmurs, rubs, or gallops.  Respiratory: Clear to auscultation bilaterally. Psych: Normal affect   Assessment & Plan    #Ventricular tachycardia #Ischemic cardiomyopathy His VT is scar mediated.  There is been progression of his severe baseline coronary artery disease. - cont IV amiodaron3 - stop IV lidocaine - start mexiletine 150mg  PO TID - if remains quiescent next 24 hours, transition amio to oral (Thursday) and plan for ICD implant Friday. - can transfer to tele today   Driving restrictions discussed this AM.  The patient has an ischemic CM and VT.At this time, he meets criteria for ICD implantation for secondary prevention of sudden death.  I have had a thorough discussion with the patient reviewing options.  The patient and their family (if available) have had opportunities to ask questions and have them answered. The patient and I have decided together through a shared decision making process to proceed with ICD implant at this time.    Risks, benefits, alternatives to ICD  implantation were discussed in detail with the patient today. The patient understands that the risks include but are not limited to bleeding, infection, pneumothorax, perforation, tamponade, vascular damage, renal failure, MI, stroke, death, inappropriate shocks, and lead dislodgement and wishes to proceed.  We will therefore schedule device implantation at the next available time.  Plan for Medtronic dual chamber ICD.       Sheria Lang T. Lalla Brothers, MD, [facility], [facility]iac Electrophysiology

## 2023-09-11 NOTE — Progress Notes (Signed)
   Patient Name: Nicholas [name] Date of Encounter: 09/11/2023 [facility] Cardiologist: Donato Schultz, MD   Interval Summary  .    Resting in bed comfortably. No family at bedside No longer experiences palpitations  Vital Signs .    Vitals:   09/11/23 0700 09/11/23 0800 09/11/23 0830 09/11/23 0900  BP: 119/74 (!) 112/90  (!) 137/90  Pulse: 71 (!) 58  67  Resp: 18 (!) 22  (!) 24  Temp:   98.3 F (36.8 C)   TempSrc:   Oral   SpO2: 97% 93%  92%  Weight:      Height:        Intake/Output Summary (Last 24 hours) at 09/11/2023 1104 Last data filed at 09/11/2023 0900 Gross per 24 hour  Intake 1572.17 ml  Output 2085 ml  Net -512.83 ml      09/11/2023    5:00 AM 09/10/2023    8:31 AM 06/28/2023    3:54 PM  Last 3 Weights  Weight (lbs) 174 lb 9.7 oz 175 lb 183 lb 6.4 oz  Weight (kg) 79.2 kg 79.379 kg 83.19 kg      Telemetry/ECG    Sinus rhythm.  No further NSVT- Personally Reviewed  Physical Exam .   GEN: No acute distress.   Neck: No JVD Cardiac: RRR, no murmurs, rubs, or gallops.  Respiratory: Clear to auscultation bilaterally. GI: Soft, nontender, non-distended  MS: No edema  Assessment & Plan .    Wide-complex tachycardia present on admission -ventricular tachycardia Chronic systolic and diastolic heart failure CAD with history of CABG in 2003. History of NSVT and PVCs Hyperlipidemia Hypertension  Patient presented to the ED for symptomatic palpitations and shortness of breath.  EKG in the ED noted wide-complex tachycardia and case reviewed with electrophysiology who confirmed that the underlying rhythm is ventricular tachycardia. He was cardioverted in ED, admitted EP / 2H.  Patient did undergo left heart catheterization yesterday was noted to have progression of his underlying CAD.  Patient remains on IV amiodarone drip.  EP has discontinued lidocaine and started mexiletine this morning.  EP plans to convert him to oral amiodarone tomorrow and  plan for ICD on Friday.  Echo will be ordered to evaluate for structural heart disease and left ventricular systolic function.  He will restart losartan 25 mg p.o. daily at his home dose.  Will restart rosuvastatin 40 mg p.o. daily, home dose.  LDL well-controlled  Patient is currently admitted to electrophysiology - general cardiology will sign off for now. Please reach out if needed.   For questions or updates, please contact  [facility] Please consult [email or web address] for contact info under     Signed, Tessa Lerner, DO, [facility]  [facility]  [address] [place], [place] [address] Pager: [phone] Office: [phone] 11:17 AM 09/11/23

## 2023-09-11 NOTE — Plan of Care (Signed)

## 2023-09-11 NOTE — Progress Notes (Signed)
*  PRELIMINARY RESULTS* Echocardiogram 2D Echocardiogram has been performed.  Nicholas [name] 09/11/2023, 12:15 PM

## 2023-09-12 LAB — BASIC METABOLIC PANEL
Anion gap: 8 (ref 5–15)
BUN: 13 mg/dL (ref 8–23)
CO2: 22 mmol/L (ref 22–32)
Calcium: 9 mg/dL (ref 8.9–10.3)
Chloride: 107 mmol/L (ref 98–111)
Creatinine, Ser: 1.37 mg/dL — ABNORMAL HIGH (ref 0.61–1.24)
GFR, Estimated: 57 mL/min — ABNORMAL LOW (ref >60)
Glucose, Bld: 110 mg/dL — ABNORMAL HIGH (ref 70–99)
Potassium: 4 mmol/L (ref 3.5–5.1)
Sodium: 137 mmol/L (ref 135–145)

## 2023-09-12 LAB — CBC
HCT: 39.8 % (ref 39.0–52.0)
Hemoglobin: 13.3 g/dL (ref 13.0–17.0)
MCH: 26.9 pg (ref 26.0–34.0)
MCHC: 33.4 g/dL (ref 30.0–36.0)
MCV: 80.6 fL (ref 80.0–100.0)
Platelets: 127 10*3/uL — ABNORMAL LOW (ref 150–400)
RBC: 4.94 MIL/uL (ref 4.22–5.81)
RDW: 14.2 % (ref 11.5–15.5)
WBC: 7.9 10*3/uL (ref 4.0–10.5)
nRBC: 0 % (ref 0.0–0.2)

## 2023-09-12 MED ORDER — AMIODARONE HCL 200 MG PO TABS
400.0000 mg | ORAL_TABLET | Freq: Every day | ORAL | Status: DC
  Administered 2023-09-13 – 2023-09-14 (×2): 400 mg via ORAL
  Filled 2023-09-12 (×2): qty 2

## 2023-09-12 MED ORDER — CHLORHEXIDINE GLUCONATE 4 % EX SOLN
60.0000 mL | Freq: Once | CUTANEOUS | Status: AC
  Administered 2023-09-12: 4 via TOPICAL
  Filled 2023-09-12: qty 45

## 2023-09-12 MED ORDER — AMIODARONE HCL 200 MG PO TABS
400.0000 mg | ORAL_TABLET | Freq: Two times a day (BID) | ORAL | Status: DC
  Administered 2023-09-12: 400 mg via ORAL
  Filled 2023-09-12: qty 2

## 2023-09-12 MED ORDER — CEFAZOLIN SODIUM-DEXTROSE 2-4 GM/100ML-% IV SOLN
2.0000 g | INTRAVENOUS | Status: AC
  Filled 2023-09-12: qty 100

## 2023-09-12 MED ORDER — AMIODARONE HCL 200 MG PO TABS: 400.0000 mg | ORAL_TABLET | Freq: Every day | ORAL | Status: DC

## 2023-09-12 MED ORDER — ALFUZOSIN HCL ER 10 MG PO TB24
10.0000 mg | ORAL_TABLET | Freq: Every day | ORAL | Status: DC
  Administered 2023-09-13 – 2023-09-14 (×2): 10 mg via ORAL
  Filled 2023-09-12 (×2): qty 1

## 2023-09-12 MED ORDER — CHLORHEXIDINE GLUCONATE 4 % EX SOLN
60.0000 mL | Freq: Once | CUTANEOUS | Status: AC
  Filled 2023-09-12: qty 15

## 2023-09-12 MED ORDER — SODIUM CHLORIDE 0.9 % IV SOLN
80.0000 mg | INTRAVENOUS | Status: AC
  Filled 2023-09-12: qty 2

## 2023-09-12 MED ORDER — ACETAMINOPHEN 325 MG PO TABS
650.0000 mg | ORAL_TABLET | ORAL | Status: DC | PRN
  Administered 2023-09-12 – 2023-09-14 (×3): 650 mg via ORAL
  Filled 2023-09-12 (×3): qty 2

## 2023-09-12 MED ORDER — SODIUM CHLORIDE 0.9 % IV SOLN: INTRAVENOUS | Status: DC

## 2023-09-12 MED ORDER — MELATONIN 5 MG PO TABS
5.0000 mg | ORAL_TABLET | Freq: Every evening | ORAL | Status: DC | PRN
  Administered 2023-09-12 – 2023-09-13 (×2): 5 mg via ORAL
  Filled 2023-09-12 (×2): qty 1

## 2023-09-12 MED FILL — Lidocaine HCl Local Preservative Free (PF) Inj 1%: INTRAMUSCULAR | Qty: 30 | Status: AC

## 2023-09-12 NOTE — Progress Notes (Signed)
NP Riddle notified of BP running 140-50/100-105 for the last several hours. Patient is asymptomatic. Order received to decrease frequency of VS to q4h.

## 2023-09-12 NOTE — Progress Notes (Signed)
  Patient Name: Nicholas [name] Date of Encounter: 09/12/2023  Primary Cardiologist: Donato Schultz, MD Electrophysiologist: Lanier Prude, MD  Interval Summary   NAEON Feeling anxious about condition and ICD procedure, thinks he has a panic attack this morning.  Having some intermitent palpitations now  Denies SOB, chest pain, chest pressure   Vital Signs    Vitals:   09/12/23 0600 09/12/23 0630 09/12/23 0700 09/12/23 0800  BP:  118/80 113/69 134/86  Pulse: 65 67 72 60  Resp: 20 (!) 23 19 19   Temp:      TempSrc:      SpO2: 92% 95% 92% 91%  Weight:      Height:        Intake/Output Summary (Last 24 hours) at 09/12/2023 0901 Last data filed at 09/12/2023 0700 Gross per 24 hour  Intake 368.73 ml  Output 325 ml  Net 43.73 ml   Filed Weights   09/10/23 0831 09/11/23 0500  Weight: 79.4 kg 79.2 kg    Physical Exam    GEN- The patient is well appearing, alert and oriented x 3 today.   Lungs- Clear to ausculation bilaterally, normal work of breathing Cardiac- Regular rate and rhythm, no murmurs, rubs or gallops GI- soft, NT, ND, + BS Extremities- no clubbing or cyanosis. No edema  Telemetry    SR 60-80s 5bt NSVT (personally reviewed)  Hospital Course    VANESSA GUNKEL is a 67 y.o. male with PMH CAD s/p CABG, NSVT, PVCs, HTN, HLD admitted for VT  Assessment & Plan    #) Ventricular tachycardia #) Ischemic cardiomyopathy LHC with no actionable lesions VT is likely scar mediated.   VT has remained quiescent Transition to PO amiodarone today Continue mexiletine 150mg  TID OK to tx out of ICU on tele No driving for 6 mon per [place] DMV NPO for ICD tomorrow     For questions or updates, please contact [facility] Please consult [email or web address] for contact info under Cardiology/STEMI.  Signed, Sherie Don, NP  09/12/2023, 9:01 AM

## 2023-09-13 ENCOUNTER — Encounter (HOSPITAL_COMMUNITY)
Admission: EM | Disposition: A | Discharge status: Home / Self Care | Payer: Self-pay | Source: Home / Self Care | Attending: Cardiology

## 2023-09-13 ENCOUNTER — Ambulatory Visit: Payer: Commercial Managed Care - PPO | Admitting: Cardiology

## 2023-09-13 ENCOUNTER — Other Ambulatory Visit: Payer: Self-pay

## 2023-09-13 HISTORY — PX: ICD IMPLANT: EP1208

## 2023-09-13 LAB — CBC
HCT: 38.3 % — ABNORMAL LOW (ref 39.0–52.0)
Hemoglobin: 12.8 g/dL — ABNORMAL LOW (ref 13.0–17.0)
MCH: 26.9 pg (ref 26.0–34.0)
MCHC: 33.4 g/dL (ref 30.0–36.0)
MCV: 80.6 fL (ref 80.0–100.0)
Platelets: 114 10*3/uL — ABNORMAL LOW (ref 150–400)
RBC: 4.75 MIL/uL (ref 4.22–5.81)
RDW: 14.1 % (ref 11.5–15.5)
WBC: 7.2 10*3/uL (ref 4.0–10.5)
nRBC: 0 % (ref 0.0–0.2)

## 2023-09-13 LAB — BASIC METABOLIC PANEL
Anion gap: 8 (ref 5–15)
BUN: 9 mg/dL (ref 8–23)
CO2: 23 mmol/L (ref 22–32)
Calcium: 9.1 mg/dL (ref 8.9–10.3)
Chloride: 107 mmol/L (ref 98–111)
Creatinine, Ser: 1.24 mg/dL (ref 0.61–1.24)
GFR, Estimated: >60 mL/min (ref >60)
Glucose, Bld: 99 mg/dL (ref 70–99)
Potassium: 3.9 mmol/L (ref 3.5–5.1)
Sodium: 138 mmol/L (ref 135–145)

## 2023-09-13 SURGERY — ICD IMPLANT

## 2023-09-13 MED ORDER — LIDOCAINE HCL 1 % IJ SOLN
INTRAMUSCULAR | Status: AC
Start: 2023-09-13 — End: ?
  Filled 2023-09-13: qty 60

## 2023-09-13 MED ORDER — CHLORHEXIDINE GLUCONATE 4 % EX SOLN
CUTANEOUS | Status: AC
  Administered 2023-09-13: 4 via TOPICAL
  Filled 2023-09-13: qty 15

## 2023-09-13 MED ORDER — MIDAZOLAM HCL 5 MG/5ML IJ SOLN
INTRAMUSCULAR | Status: AC
  Filled 2023-09-13: qty 5

## 2023-09-13 MED ORDER — CEFAZOLIN SODIUM-DEXTROSE 2-4 GM/100ML-% IV SOLN
INTRAVENOUS | Status: AC
  Filled 2023-09-13: qty 100

## 2023-09-13 MED ORDER — SIMETHICONE 80 MG PO CHEW
80.0000 mg | CHEWABLE_TABLET | Freq: Four times a day (QID) | ORAL | Status: DC | PRN
  Administered 2023-09-13: 80 mg via ORAL
  Filled 2023-09-13: qty 1

## 2023-09-13 MED ORDER — FENTANYL CITRATE (PF) 100 MCG/2ML IJ SOLN
INTRAMUSCULAR | Status: DC | PRN
  Administered 2023-09-13: 12.5 ug via INTRAVENOUS
  Administered 2023-09-13 (×2): 25 ug via INTRAVENOUS
  Administered 2023-09-13: 12.5 ug via INTRAVENOUS

## 2023-09-13 MED ORDER — FENTANYL CITRATE (PF) 100 MCG/2ML IJ SOLN
INTRAMUSCULAR | Status: AC
  Filled 2023-09-13: qty 2

## 2023-09-13 MED ORDER — LIDOCAINE HCL (PF) 1 % IJ SOLN
INTRAMUSCULAR | Status: DC | PRN
  Administered 2023-09-13: 50 mL

## 2023-09-13 MED ORDER — ONDANSETRON HCL 4 MG/2ML IJ SOLN: 4.0000 mg | Freq: Four times a day (QID) | INTRAMUSCULAR | Status: DC | PRN

## 2023-09-13 MED ORDER — CEFAZOLIN SODIUM-DEXTROSE 2-4 GM/100ML-% IV SOLN
INTRAVENOUS | Status: AC
  Administered 2023-09-13: 2 g via INTRAVENOUS
  Filled 2023-09-13: qty 100

## 2023-09-13 MED ORDER — SODIUM CHLORIDE 0.9 % IV SOLN
INTRAVENOUS | Status: AC
  Administered 2023-09-13: 80 mg
  Filled 2023-09-13: qty 2

## 2023-09-13 MED ORDER — POTASSIUM CHLORIDE CRYS ER 20 MEQ PO TBCR
20.0000 meq | EXTENDED_RELEASE_TABLET | Freq: Once | ORAL | Status: AC
  Administered 2023-09-13: 20 meq via ORAL
  Filled 2023-09-13: qty 1

## 2023-09-13 MED ORDER — MIDAZOLAM HCL 5 MG/5ML IJ SOLN
INTRAMUSCULAR | Status: DC | PRN
  Administered 2023-09-13: 1 mg via INTRAVENOUS
  Administered 2023-09-13: .5 mg via INTRAVENOUS
  Administered 2023-09-13: 1 mg via INTRAVENOUS
  Administered 2023-09-13: .5 mg via INTRAVENOUS

## 2023-09-13 SURGICAL SUPPLY — 7 items
CABLE SURGICAL S-101-97-12 (CABLE) ×1 IMPLANT
ICD COBALT XT VR DVPA2D4 (ICD Generator) IMPLANT
LEAD SPRINT QUAT SEC 6935M-62 (Lead) IMPLANT
PAD DEFIB RADIO PHYSIO CONN (PAD) ×1 IMPLANT
SHEATH 9FR PRELUDE SNAP 13 (SHEATH) IMPLANT
SHEATH PROBE COVER 6X72 (BAG) IMPLANT
TRAY PACEMAKER INSERTION (PACKS) ×1 IMPLANT

## 2023-09-13 NOTE — Telephone Encounter (Signed)
ICD implant to be completed today.

## 2023-09-13 NOTE — Plan of Care (Signed)
  Problem: Education: Goal: Knowledge of General Education information will improve Description: Including pain rating scale, medication(s)/side effects and non-pharmacologic comfort measures Outcome: Progressing   Problem: Health Behavior/Discharge Planning: Goal: Ability to manage health-related needs will improve Outcome: Progressing   Problem: Clinical Measurements: Goal: Ability to maintain clinical measurements within normal limits will improve Outcome: Progressing Goal: Will remain free from infection Outcome: Progressing Goal: Diagnostic test results will improve Outcome: Progressing Goal: Respiratory complications will improve Outcome: Progressing Goal: Cardiovascular complication will be avoided Outcome: Progressing   Problem: Activity: Goal: Risk for activity intolerance will decrease Outcome: Progressing   Problem: Nutrition: Goal: Adequate nutrition will be maintained Outcome: Progressing   Problem: Coping: Goal: Level of anxiety will decrease Outcome: Progressing   Problem: Elimination: Goal: Will not experience complications related to bowel motility Outcome: Progressing Goal: Will not experience complications related to urinary retention Outcome: Progressing   Problem: Pain Management: Goal: General experience of comfort will improve Outcome: Progressing   Problem: Safety: Goal: Ability to remain free from injury will improve Outcome: Progressing   Problem: Skin Integrity: Goal: Risk for impaired skin integrity will decrease Outcome: Progressing   Problem: Education: Goal: Understanding of CV disease, CV risk reduction, and recovery process will improve Outcome: Progressing Goal: Individualized Educational Video(s) Outcome: Progressing   Problem: Activity: Goal: Ability to return to baseline activity level will improve Outcome: Progressing   Problem: Cardiovascular: Goal: Ability to achieve and maintain adequate cardiovascular perfusion  will improve Outcome: Progressing Goal: Vascular access site(s) Level 0-1 will be maintained Outcome: Progressing   Problem: Health Behavior/Discharge Planning: Goal: Ability to safely manage health-related needs after discharge will improve Outcome: Progressing   Problem: Education: Goal: Knowledge of cardiac device and self-care will improve Outcome: Progressing Goal: Ability to safely manage health related needs after discharge will improve Outcome: Progressing Goal: Individualized Educational Video(s) Outcome: Progressing   Problem: Cardiac: Goal: Ability to achieve and maintain adequate cardiopulmonary perfusion will improve Outcome: Progressing

## 2023-09-13 NOTE — Discharge Instructions (Signed)
After Your ICD (Implantable Cardiac Defibrillator)   You have a Medtronic ICD  ACTIVITY Do not lift your arm above shoulder height for 1 week after your procedure. After 7 days, you may progress as below.  You should remove your sling 24 hours after your procedure, unless otherwise instructed by your provider.     Friday September 20, 2023  Saturday September 21, 2023 Sunday September 22, 2023 Monday September 23, 2023   Do not lift, push, pull, or carry anything over 10 pounds with the affected arm until 6 weeks (Friday October 25, 2023 ) after your procedure.   You may drive AFTER your wound check, unless you have been told otherwise by your provider.   Ask your healthcare provider when you can go back to work   INCISION/Dressing If you are on a blood thinner such as Coumadin, Xarelto, Eliquis, Plavix, or Pradaxa please confirm with your provider when this should be resumed.   If large square, outer bandage is left in place, this can be removed after 24 hours from your procedure. Do not remove steri-strips or glue as below.   Monitor your defibrillator site for redness, swelling, and drainage. Call the device clinic at [phone] if you experience these symptoms or fever/chills.  If your incision is sealed with Steri-strips or staples, you may shower 7 days after your procedure or when told by your provider. Do not remove the steri-strips or let the shower hit directly on your site. You may wash around your site with soap and water.    If you were discharged in a sling, please do not wear this during the day more than 48 hours after your surgery unless otherwise instructed. This may increase the risk of stiffness and soreness in your shoulder.   Avoid lotions, ointments, or perfumes over your incision until it is well-healed.  You may use a hot tub or a pool AFTER your wound check appointment if the incision is completely closed.  Your ICD is designed to protect you from life  threatening heart rhythms. Because of this, you may receive a shock.   1 shock with no symptoms:  Call the office during business hours. 1 shock with symptoms (chest pain, chest pressure, dizziness, lightheadedness, shortness of breath, overall feeling unwell):  Call 911. If you experience 2 or more shocks in 24 hours:  Call 911. If you receive a shock, you should not drive for 6 months per the [place] DMV IF you receive appropriate therapy from your ICD.   ICD Alerts:  Some alerts are vibratory and others beep. These are NOT emergencies. Please call our office to let us know. If this occurs at night or on weekends, it can wait until the next business day. Send a remote transmission.  If your device is capable of reading fluid status (for heart failure), you will be offered monthly monitoring to review this with you.   DEVICE MANAGEMENT Remote monitoring is used to monitor your ICD from home. This monitoring is scheduled every 91 days by our office. It allows [place] to keep an eye on the functioning of your device to ensure it is working properly. You will routinely see your Electrophysiologist annually (more often if necessary).   You should receive your ID card for your new device in 4-8 weeks. Keep this card with you at all times once received. Consider wearing a medical alert bracelet or necklace.  Your ICD  may be MRI compatible. This will be discussed at your next  office visit/wound check.  You should avoid contact with strong electric or magnetic fields.   Do not use amateur (ham) radio equipment or electric (arc) welding torches. MP3 player headphones with magnets should not be used. Some devices are safe to use if held at least 12 inches (30 cm) from your defibrillator. These include power tools, lawn mowers, and speakers. If you are unsure if something is safe to use, ask your health care provider.  When using your cell phone, hold it to the ear that is on the opposite side from the  defibrillator. Do not leave your cell phone in a pocket over the defibrillator.  You may safely use electric blankets, heating pads, computers, and microwave ovens.  Call the office right away if: You have chest pain. You feel more than one shock. You feel more short of breath than you have felt before. You feel more light-headed than you have felt before. Your incision starts to open up.  This information is not intended to replace advice given to you by your health care provider. Make sure you discuss any questions you have with your health care provider.

## 2023-09-13 NOTE — Progress Notes (Signed)
  Patient Name: [name] Date of Encounter: 09/13/2023  Primary Cardiologist: Donato Schultz, MD Electrophysiologist: Lanier Prude, MD  Interval Summary   The patient is doing well today.  At this time, the patient denies chest pain, shortness of breath, or any new concerns.  Vital Signs    Vitals:   09/13/23 0400 09/13/23 0500 09/13/23 0600 09/13/23 0700  BP: 115/82 (!) 143/98 (!) 140/95 (!) 135/99  Pulse: 70 60 68 72  Resp: 16 17 (!) 21 17  Temp: 98.5 F (36.9 C)     TempSrc: Oral     SpO2: 92% 91% 92% 97%  Weight:  80.5 kg    Height:        Intake/Output Summary (Last 24 hours) at 09/13/2023 0837 Last data filed at 09/13/2023 0700 Gross per 24 hour  Intake 1066.45 ml  Output 875 ml  Net 191.45 ml   Filed Weights   09/10/23 0831 09/11/23 0500 09/13/23 0500  Weight: 79.4 kg 79.2 kg 80.5 kg    Physical Exam    GEN- The patient is well appearing, alert and oriented x 3 today.   Lungs- Clear to ausculation bilaterally, normal work of breathing Cardiac- Regular rate and rhythm, no murmurs, rubs or gallops GI- soft, NT, ND, + BS Extremities- no clubbing or cyanosis. No edema  Telemetry    NSR 60-70s (personally reviewed)  Hospital Course    Nicholas [name] is a 67 y.o. male with PMH CAD s/p CABG, NSVT, PVCs, HTN, HLD admitted for VT   [facility] 09/10/2023  Assessment & Plan    #) Ventricular tachycardia #) Ischemic cardiomyopathy LHC with no actionable lesions VT is likely scar mediated.   VT has remained quiescent Continue amiodarone 400 mg daily Continue mexiletine 150mg  TID OK to tx out of ICU on tele No driving for 6 mon per [place] DMV Explained risks, benefits, and alternatives to ICD implantation, including but not limited to bleeding, infection, pneumothorax, pericardial effusion, lead dislodgement, heart attack, stroke, or death.  Pt verbalized understanding and agrees to proceed.    Not on [facility]  For questions or updates, please contact [facility] Please consult [email or web address] for contact info under Cardiology/STEMI.  Signed, Graciella Freer, PA-C  09/13/2023, 8:37 AM

## 2023-09-14 ENCOUNTER — Inpatient Hospital Stay (HOSPITAL_COMMUNITY): Payer: Commercial Managed Care - PPO

## 2023-09-14 ENCOUNTER — Other Ambulatory Visit (HOSPITAL_COMMUNITY): Payer: Self-pay

## 2023-09-14 DIAGNOSIS — Z95810 Presence of automatic (implantable) cardiac defibrillator: Secondary | ICD-10-CM

## 2023-09-14 LAB — CBC
HCT: 40.8 % (ref 39.0–52.0)
Hemoglobin: 14.2 g/dL (ref 13.0–17.0)
MCH: 28 pg (ref 26.0–34.0)
MCHC: 34.8 g/dL (ref 30.0–36.0)
MCV: 80.5 fL (ref 80.0–100.0)
Platelets: 78 10*3/uL — ABNORMAL LOW (ref 150–400)
RBC: 5.07 MIL/uL (ref 4.22–5.81)
RDW: 14.2 % (ref 11.5–15.5)
WBC: 7.3 10*3/uL (ref 4.0–10.5)
nRBC: 0 % (ref 0.0–0.2)

## 2023-09-14 LAB — BASIC METABOLIC PANEL
Anion gap: 9 (ref 5–15)
BUN: 13 mg/dL (ref 8–23)
CO2: 20 mmol/L — ABNORMAL LOW (ref 22–32)
Calcium: 8.9 mg/dL (ref 8.9–10.3)
Chloride: 108 mmol/L (ref 98–111)
Creatinine, Ser: 1.22 mg/dL (ref 0.61–1.24)
GFR, Estimated: >60 mL/min (ref >60)
Glucose, Bld: 91 mg/dL (ref 70–99)
Potassium: 4.6 mmol/L (ref 3.5–5.1)
Sodium: 137 mmol/L (ref 135–145)

## 2023-09-14 MED ORDER — AMIODARONE HCL 200 MG PO TABS
400.0000 mg | ORAL_TABLET | Freq: Every day | ORAL | 3 refills | Status: DC
  Filled 2023-09-14: qty 60, 30d supply, fill #0

## 2023-09-14 MED ORDER — MEXILETINE HCL 150 MG PO CAPS
150.0000 mg | ORAL_CAPSULE | Freq: Three times a day (TID) | ORAL | 3 refills | Status: DC
  Filled 2023-09-14: qty 90, 30d supply, fill #0
  Filled 2023-10-16: qty 90, 30d supply, fill #1
  Filled 2023-11-18: qty 90, 30d supply, fill #2
  Filled 2023-12-23: qty 90, 30d supply, fill #3

## 2023-09-14 NOTE — Progress Notes (Addendum)
   Patient Name: JONDAVID SINANAN Date of Encounter: 09/14/2023 [facility] Cardiologist: Donato Schultz, MD   Interval Summary  .    VVI ICD implant yesterday. No acute overnight events. Some left chest soreness but improving.   Vital Signs .    Vitals:   09/13/23 2352 09/14/23 0523 09/14/23 0838 09/14/23 0900  BP: 120/84 (!) 152/98 (!) 121/91   Pulse: 85 79    Resp: 20 18 17    Temp: 98.4 F (36.9 C) 98.7 F (37.1 C) 98.7 F (37.1 C)   TempSrc: Oral Oral Oral   SpO2: 97% 95%    Weight:    80.8 kg  Height:        Intake/Output Summary (Last 24 hours) at 09/14/2023 1009 Last data filed at 09/14/2023 0730 Gross per 24 hour  Intake 791.37 ml  Output 450 ml  Net 341.37 ml      09/14/2023    9:00 AM 09/13/2023    5:00 AM 09/11/2023    5:00 AM  Last 3 Weights  Weight (lbs) 178 lb 2.1 oz 177 lb 8 oz 174 lb 9.7 oz  Weight (kg) 80.8 kg 80.513 kg 79.2 kg      Telemetry/ECG    Sinus rhythm with PVCs - Personally Reviewed  Physical Exam .   GEN: No acute distress.   Neck: No JVD Cardiac: Normal rate, regular rhythm. Left chest pocket without hematoma. Respiratory: Normal work of breathing. GI: Soft, nontender, non-distended  MS: No edema  Device Check:  RV - threshold 0.5V @0 .4ms, sensing 8.66mV, paced impedance 570 ohms, shock impedance 55 ohms - VP <0.1%  Assessment & Plan .     KENECHUKWU CONROY is a 67 y.o. male with ischemic cardiomyopathy complicated by hemodynamically significant sustained monomorphic ventricular tachycardia, EF 50%.   S/p VVI ICD yesterday for secondary prevention.   #. Ischemic cardiomyopathy #. VT #. CAD  - CXR with appropriate lead position and no pneumothorax by my interpretation. Formal radiology read is pending.  - Device check normal with appropriate function and stable lead parameters.  - Cont amiodarone 400mg  PO daily. - Cont mexiletine 150mg  PO TID. - Discharge teaching done.   For questions or updates, please contact [facility] Please consult [email or web address] for contact info under        Signed, Nobie Putnam, MD

## 2023-09-14 NOTE — Discharge Summary (Signed)
Discharge Summary    Patient ID: Nicholas [name] MRN: [id number]; DOB: January 20, 1956  Admit date: 09/10/2023 Discharge date: 09/14/2023  PCP:  Irven Coe, MD   [facility] Providers Cardiologist:  Donato Schultz, MD  Electrophysiologist:  Lanier Prude, MD       Discharge Diagnoses    Principal Problem:   VT (ventricular tachycardia) [facility]) Active Problems:   Hx of CABG   Long term current use of antiarrhythmic drug   Acute renal insufficiency   Benign hypertension   Mixed hyperlipidemia   Palpitations   Dyspnea on exertion   Atherosclerosis of native coronary artery of native heart without angina pectoris    Diagnostic Studies/Procedures    ICD IMPLANT: 09/13/2023  CONCLUSIONS:   1. Severe coronary artery disease complicated by hemodynamically significant monomorphic ventricular tachycardia  2. Successful ICD implantation with a Medtronic VVI ICD implanted for secondary prevention   3. No early apparent complications.     Through the left axillary vein, a RV single coil ICD lead was advanced to the RV mid septum (model [id number], serial [id number]) where it displayed excelling pacing (1 V at 0.40ms) and sensing thresholds (8.3 mV) with an acceptable impedance (570 ohms).     The lead was then connected to a pulse generator (model Cobalt XT VR MRI SureScan, serial [id number]).   ECHO: 09/11/2023  1. Left ventricular ejection fraction, by estimation, is 55 to 60%. The left ventricle has normal function. The left ventricle has no regional wall motion abnormalities. There is mild concentric left ventricular hypertrophy. Left ventricular diastolic parameters are indeterminate.   2. Right ventricular systolic function is normal. The right ventricular size is normal. Tricuspid regurgitation signal is inadequate for assessing PA pressure.   3. The mitral valve is normal in structure. Mild mitral valve  regurgitation. No evidence of mitral stenosis.   4. The aortic valve is  tricuspid. Aortic valve regurgitation is mild. No aortic stenosis is present. Aortic regurgitation PHT measures 658 msec.   5. There is mild dilatation of the ascending aorta, measuring 40 mm.   6. The inferior vena cava is normal in size with greater than 50% respiratory variability, suggesting right atrial pressure of 3 mmHg.   CARDIAC CATH: 09/10/2023    Ramus lesion is 40% stenosed.   Ost Cx to Prox Cx lesion is 40% stenosed.   Dist Cx lesion is 100% stenosed.   Left Heart Catheterization 09/10/23: Hemodynamic data: LV EDP 1 mmHg.  No pressure gradient across the aortic valve.  Aortic pressure 97/83, mean 83 mmHg.   Angiographic data: RCA: Could not be visualized and spite of multiple catheters.  Presumed to be occluded, nonselective root aortogram revealing a small thread of RCA and appears nondominant.  Could not be visualized also in previous catheterization in 2022. LM: Large-caliber vessel.  It is smooth and normal. LAD: Large-caliber vessel, giving origin to a large D1 which is occluded.  Gives origin to several small diagonals.  There is mild disease throughout the LAD. LCx: Dominant.  Proximal diffuse disease with 40% stenosis.  Large OM1 is occluded in the proximal segment.  Distal circumflex is occluded and has episil flow type III collaterals to the distal CX. SVG to OM1: 40% ostial stenosis unchanged from 2022.  Otherwise smooth and normal. SVG to D1: New occlusion since 2022. LIMA to LAD: Not visualized as LAD does not have disease and LIMA is atretic by catheterization in 2022.      Impression and recommendations:  Patient has occluded CX with patent SVG to OM and type III ipsilateral CX to CX from proximal to distal CX, new occlusion of SVG to D1, which does not appear to be acute. _____________   History of Present Illness     Nicholas [name] is a 67 y.o. male with hx of  CAD s/p CABG (2003), ischemic cardiomyopathy, chronic systolic and diastolic heart failure, NSVT,  HTN, HLD, CKD III, basal inferior aneurysm within the LV , PVCs, was admitted 12/03 with WCT.   Hospital Course     Consultants: EP   He was initially seen by Gen [facility] but an EP consult was called for sustained and symptomatic VT.   The major symptom associated with this was SOB.   An echo showed a normal EF and normal RV function.   Cardiac cath showed OM1 100%, SVG-D1 occlusion, but not acute >> med rx.  The VT was felt scar mediated. He was started on IV amio and IV lidocaine. The next day lidocaine d/c'd and mexiletine po started.   Since he remained stable on that regimen, the IV amiodarone was weaned back to oral formulation, continue mexiletine. A defibrillator was tentatively planned for Friday implant.   A single lead Medtronic defibrillator was implanted 12/06, w/out complication. VVI mode.   On 12/07, he was seen by Dr Jimmey Ralph and all data were reviewed. Device check is below. CXR was w/out PTX.   No further inpatient workup is indicated and he is considered stable for discharge, to follow up as an outpatient.   Device Check:  RV - threshold 0.5V @0 .4ms, sensing 8.8mV, paced impedance 570 ohms, shock impedance 55 ohms - VP <0.1%     _____________  Discharge Vitals Blood pressure (!) 121/91, pulse 79, temperature 98.7 F (37.1 C), temperature source Oral, resp. rate 17, height 5' 11.5" (1.816 m), weight 80.8 kg, SpO2 95%.  Filed Weights   09/11/23 0500 09/13/23 0500 09/14/23 0900  Weight: 79.2 kg 80.5 kg 80.8 kg    Labs & Radiologic Studies    CBC Recent Labs    09/13/23 0259 09/14/23 0301  WBC 7.2 7.3  HGB 12.8* 14.2  HCT 38.3* 40.8  MCV 80.6 80.5  PLT 114* 78*   Basic Metabolic Panel Recent Labs    16/10/96 0259 09/14/23 0301  NA 138 137  K 3.9 4.6  CL 107 108  CO2 23 20*  GLUCOSE 99 91  BUN 9 13  CREATININE 1.24 1.22  CALCIUM 9.1 8.9   Liver Function Tests No results for input(s): "AST", "ALT", "ALKPHOS", "BILITOT", "PROT", "ALBUMIN" in  the last 72 hours. No results for input(s): "LIPASE", "AMYLASE" in the last 72 hours. High Sensitivity Troponin:   Recent Labs  Lab 09/10/23 0858 09/10/23 1045  TROPONINIHS 92* 82*    BNP Invalid input(s): "POCBNP" D-Dimer No results for input(s): "DDIMER" in the last 72 hours. Hemoglobin A1C No results for input(s): "HGBA1C" in the last 72 hours. Fasting Lipid Panel No results for input(s): "CHOL", "HDL", "LDLCALC", "TRIG", "CHOLHDL", "LDLDIRECT" in the last 72 hours. Thyroid Function Tests No results for input(s): "TSH", "T4TOTAL", "T3FREE", "THYROIDAB" in the last 72 hours.  Invalid input(s): "FREET3" _____________  DG Chest 2 View  Result Date: 09/14/2023 CLINICAL DATA:  Permanent fibrillator placement. EXAM: CHEST - 2 VIEW COMPARISON:  09/10/2023 FINDINGS: New single lead left-sided AICD evident. No left-sided pneumothorax. Lungs are clear. No pleural effusion. The cardiopericardial silhouette is within normal limits for size. Telemetry leads overlie the chest.  IMPRESSION: New single lead left-sided AICD without pneumothorax. Electronically Signed   By: Kennith Center M.D.   On: 09/14/2023 10:20   EP PPM/ICD IMPLANT  Result Date: 09/13/2023  CONCLUSIONS:  1. Severe coronary artery disease complicated by hemodynamically significant monomorphic ventricular tachycardia  2. Successful ICD implantation with a Medtronic VVI ICD implanted for secondary prevention  3. No early apparent complications.   ECHOCARDIOGRAM COMPLETE  Result Date: 09/11/2023    ECHOCARDIOGRAM REPORT   Patient Name:   Nicholas [name] Date of Exam: 09/11/2023 Medical Rec #:  [id number]      Height:       71.5 in Accession #:    [id number]     Weight:       174.6 lb Date of Birth:  01/02/1956      BSA:          2.001 m Patient Age:    67 years       BP:           126/98 mmHg Patient Gender: M              HR:           50 bpm. Exam Location:  Inpatient Procedure: 2D Echo, Cardiac Doppler and Color Doppler Indications:     CAD  History:        Patient has prior history of Echocardiogram examinations, most                 recent 07/03/2022. CAD and Previous Myocardial Infarction, Prior                 CABG; Risk Factors:Hypertension, Dyslipidemia and Former Smoker.  Sonographer:    Dondra Prader RVT rcs Referring Phys: Tessa Lerner  Sonographer Comments: Suboptimal subcostal window. IMPRESSIONS  1. Left ventricular ejection fraction, by estimation, is 55 to 60%. The left ventricle has normal function. The left ventricle has no regional wall motion abnormalities. There is mild concentric left ventricular hypertrophy. Left ventricular diastolic parameters are indeterminate.  2. Right ventricular systolic function is normal. The right ventricular size is normal. Tricuspid regurgitation signal is inadequate for assessing PA pressure.  3. The mitral valve is normal in structure. Mild mitral valve regurgitation. No evidence of mitral stenosis.  4. The aortic valve is tricuspid. Aortic valve regurgitation is mild. No aortic stenosis is present. Aortic regurgitation PHT measures 658 msec.  5. There is mild dilatation of the ascending aorta, measuring 40 mm.  6. The inferior vena cava is normal in size with greater than 50% respiratory variability, suggesting right atrial pressure of 3 mmHg. Comparison(s): EF 50-55%. FINDINGS  Left Ventricle: Left ventricular ejection fraction, by estimation, is 55 to 60%. The left ventricle has normal function. The left ventricle has no regional wall motion abnormalities. The left ventricular internal cavity size was normal in size. There is  mild concentric left ventricular hypertrophy. Left ventricular diastolic parameters are indeterminate. Right Ventricle: The right ventricular size is normal. No increase in right ventricular wall thickness. Right ventricular systolic function is normal. Tricuspid regurgitation signal is inadequate for assessing PA pressure. Left Atrium: Left atrial size was normal in size.  Right Atrium: Right atrial size was normal in size. Pericardium: There is no evidence of pericardial effusion. Mitral Valve: The mitral valve is normal in structure. Mild mitral valve regurgitation. No evidence of mitral valve stenosis. Tricuspid Valve: The tricuspid valve is normal in structure. Tricuspid valve regurgitation is mild . No evidence of tricuspid  stenosis. Aortic Valve: The aortic valve is tricuspid. Aortic valve regurgitation is mild. Aortic regurgitation PHT measures 658 msec. No aortic stenosis is present. Aortic valve mean gradient measures 3.0 mmHg. Aortic valve peak gradient measures 6.4 mmHg. Aortic  valve area, by VTI measures 2.92 cm. Pulmonic Valve: The pulmonic valve was normal in structure. Pulmonic valve regurgitation is trivial. No evidence of pulmonic stenosis. Aorta: The aortic root is normal in size and structure. There is mild dilatation of the ascending aorta, measuring 40 mm. Venous: The inferior vena cava is normal in size with greater than 50% respiratory variability, suggesting right atrial pressure of 3 mmHg. IAS/Shunts: No atrial level shunt detected by color flow Doppler.  LEFT VENTRICLE PLAX 2D LVIDd:         4.70 cm   Diastology LVIDs:         3.20 cm   LV e' medial:    4.57 cm/s LV PW:         1.40 cm   LV E/e' medial:  13.5 LV IVS:        1.30 cm   LV e' lateral:   6.74 cm/s LVOT diam:     2.00 cm   LV E/e' lateral: 9.2 LV SV:         76 LV SV Index:   38 LVOT Area:     3.14 cm  RIGHT VENTRICLE RV Basal diam:  3.90 cm RV S prime:     8.92 cm/s TAPSE (M-mode): 2.1 cm LEFT ATRIUM             Index        RIGHT ATRIUM           Index LA diam:        3.40 cm 1.70 cm/m   RA Area:     15.70 cm LA Vol (A2C):   28.3 ml 14.14 ml/m  RA Volume:   45.00 ml  22.49 ml/m LA Vol (A4C):   23.4 ml 11.70 ml/m LA Biplane Vol: 26.2 ml 13.10 ml/m  AORTIC VALVE                    PULMONIC VALVE AV Area (Vmax):    3.08 cm     PV Vmax:       0.94 m/s AV Area (Vmean):   2.80 cm     PV  Peak grad:  3.6 mmHg AV Area (VTI):     2.92 cm AV Vmax:           126.00 cm/s AV Vmean:          80.500 cm/s AV VTI:            0.261 m AV Peak Grad:      6.4 mmHg AV Mean Grad:      3.0 mmHg LVOT Vmax:         123.50 cm/s LVOT Vmean:        71.800 cm/s LVOT VTI:          0.242 m LVOT/AV VTI ratio: 0.93 AI PHT:            658 msec AR Vena Contracta: 0.35 cm  AORTA Ao Root diam: 3.20 cm Ao Asc diam:  4.00 cm MITRAL VALVE MV Area (PHT): 2.79 cm    SHUNTS MV Decel Time: 272 msec    Systemic VTI:  0.24 m MV E velocity: 61.80 cm/s  Systemic Diam: 2.00 cm MV A velocity: 98.50 cm/s MV  E/A ratio:  0.63 Kardie Tobb DO Electronically signed by Thomasene Ripple DO Signature Date/Time: 09/11/2023/12:24:12 PM    Final    CARDIAC CATHETERIZATION  Result Date: 09/10/2023 Images from the original result were not included.   Ramus lesion is 40% stenosed.   Ost Cx to Prox Cx lesion is 40% stenosed.   Dist Cx lesion is 100% stenosed. Left Heart Catheterization 09/10/23: Hemodynamic data: LV EDP 1 mmHg.  No pressure gradient across the aortic valve.  Aortic pressure 97/83, mean 83 mmHg. Angiographic data: RCA: Could not be visualized and spite of multiple catheters.  Presumed to be occluded, nonselective root aortogram revealing a small thread of RCA and appears nondominant.  Could not be visualized also in previous catheterization in 2022. LM: Large-caliber vessel.  It is smooth and normal. LAD: Large-caliber vessel, giving origin to a large D1 which is occluded.  Gives origin to several small diagonals.  There is mild disease throughout the LAD. LCx: Dominant.  Proximal diffuse disease with 40% stenosis.  Large OM1 is occluded in the proximal segment.  Distal circumflex is occluded and has episil flow type III collaterals to the distal CX. SVG to OM1: 40% ostial stenosis unchanged from 2022.  Otherwise smooth and normal. SVG to D1: New occlusion since 2022. LIMA to LAD: Not visualized as LAD does not have disease and LIMA is atretic  by catheterization in 2022. Impression and recommendations: Patient has occluded CX with patent SVG to OM and type III ipsilateral CX to CX from proximal to distal CX, new occlusion of SVG to D1, which does not appear to be acute.   DG Chest Portable 1 View  Result Date: 09/10/2023 CLINICAL DATA:  Cough.  Shortness of breath. EXAM: PORTABLE CHEST 1 VIEW COMPARISON:  06/16/2021. FINDINGS: Bilateral lung fields are clear. Elevated left hemidiaphragm seen. Bilateral costophrenic angles are clear. Normal cardio-mediastinal silhouette. Sternotomy wires noted. No acute osseous abnormalities. The soft tissues are within normal limits. IMPRESSION: *No active disease. Electronically Signed   By: Jules Schick M.D.   On: 09/10/2023 09:31   Disposition   Pt is being discharged home today in good condition.  Follow-up Plans & Appointments     Discharge Instructions     Call MD for:  redness, tenderness, or signs of infection (pain, swelling, redness, odor or green/yellow discharge around incision site)   Complete by: As directed    Diet - low sodium heart healthy   Complete by: As directed    Increase activity slowly   Complete by: As directed         Discharge Medications   Allergies as of 09/14/2023       Reactions   Atorvastatin Calcium Other (See Comments)   Nausea   Etomidate Other (See Comments)   Intolerance        Medication List     TAKE these medications    alfuzosin 10 MG 24 hr tablet Commonly known as: UROXATRAL Take 1 tablet (10 mg total) by mouth daily immediately after meal for 90 days.   amiodarone 200 MG tablet Commonly known as: PACERONE Take 2 tablets (400 mg total) by mouth daily. What changed:  medication strength how much to take   aspirin EC 81 MG tablet Take 81 mg by mouth daily with breakfast.   docusate sodium 100 MG capsule Commonly known as: COLACE Take 100 mg by mouth every evening.   losartan 25 MG tablet Commonly known as: COZAAR Take  1 tablet (25 mg total)  by mouth at bedtime.   metoprolol succinate 25 MG 24 hr tablet Commonly known as: Toprol XL Take 1 tablet (25 mg total) by mouth daily.   mexiletine 150 MG capsule Commonly known as: MEXITIL Take 1 capsule (150 mg total) by mouth every 8 (eight) hours.   nitroGLYCERIN 0.4 MG SL tablet Commonly known as: NITROSTAT Place 1 tablet (0.4 mg total) under the tongue every 5 (five) minutes x 3 doses as needed for chest pain.   rosuvastatin 40 MG tablet Commonly known as: CRESTOR Take 1 tablet (40 mg total) by mouth daily at 6 PM.           Outstanding Labs/Studies   None  Duration of Discharge Encounter   Greater than 30 minutes including physician time.  Signed, Theodore Demark, PA-C 09/14/2023, 10:51 AM

## 2023-09-15 LAB — CULTURE, BLOOD (ROUTINE X 2)
Culture: NO GROWTH
Culture: NO GROWTH
Special Requests: ADEQUATE

## 2023-09-16 ENCOUNTER — Encounter (HOSPITAL_COMMUNITY): Payer: Self-pay | Admitting: Cardiology

## 2023-09-16 MED FILL — Cefazolin Sodium-Dextrose IV Solution 2 GM/100ML-4%: INTRAVENOUS | Qty: 100 | Status: AC

## 2023-09-17 ENCOUNTER — Telehealth: Payer: Self-pay

## 2023-09-17 NOTE — Telephone Encounter (Signed)
Follow-up after same day discharge: Implant date: 09/13/2023 MD: Steffanie Dunn  Device: Medtronic Cobalt  Location: Left Chest   Wound check visit: 09/27/2023 90 day MD follow-up: 12/26/2023  Remote Transmission received:yes  Dressing/sling removed: n/a  Confirm OAC restart on: n/a  Please continue to monitor your cardiac device site for redness, swelling, and drainage. Call the device clinic at [phone] if you experience these symptoms, fever/chills, or have questions about your device.   Remote monitoring is used to monitor your cardiac device from home. This monitoring is scheduled every 91 days by our office. It allows [place] to keep an eye on the functioning of your device to ensure it is working properly.  LMOVM for pt to give me a call back.

## 2023-09-17 NOTE — Progress Notes (Signed)
Coding Query Response:  CKD stage 3a

## 2023-09-19 DIAGNOSIS — I10 Essential (primary) hypertension: Secondary | ICD-10-CM | POA: Diagnosis not present

## 2023-09-19 DIAGNOSIS — I471 Supraventricular tachycardia, unspecified: Secondary | ICD-10-CM | POA: Diagnosis not present

## 2023-09-19 DIAGNOSIS — I2510 Atherosclerotic heart disease of native coronary artery without angina pectoris: Secondary | ICD-10-CM | POA: Diagnosis not present

## 2023-09-19 DIAGNOSIS — I255 Ischemic cardiomyopathy: Secondary | ICD-10-CM | POA: Diagnosis not present

## 2023-09-19 DIAGNOSIS — Z95810 Presence of automatic (implantable) cardiac defibrillator: Secondary | ICD-10-CM | POA: Diagnosis not present

## 2023-09-25 NOTE — Telephone Encounter (Signed)
The pt wife states he is doing fine. The dressing is off. The pt has started his regular medications. The pt do not have any questions.

## 2023-09-25 NOTE — Progress Notes (Signed)
  Electrophysiology Office Note:   ID:  Nicholas, [name] 1955-11-11, MRN [id number]  Primary Cardiologist: Donato Schultz, MD Electrophysiologist: Lanier Prude, MD      History of Present Illness:   Nicholas [name] is a 67 y.o. male with h/o Ischemic cardiomyopathy and MMVT seen today for post hospital follow up.    Admitted 12/3 with SOB and found to be in [facility] felt to be consistent with VT. An echo showed a normal EF and normal RV function. Cardiac cath showed OM1 100%, SVG-D1 occlusion, but not acute >> med rx.  The VT was felt scar mediated. He was started on IV amio and IV lidocaine. The next day lidocaine d/c'd and mexiletine po started and ultimately underwent ICD implantation prior to d/c.   Since discharge from hospital the patient reports doing well. No further symptoms. Overall,  he denies chest pain, palpitations, dyspnea, PND, orthopnea, nausea, vomiting, dizziness, syncope, edema, weight gain, or early satiety.   Review of systems complete and found to be negative unless listed in HPI.   EP Information / Studies Reviewed:    EKG is not ordered today. EKG from 09/14/2023 reviewed which showed NSR at 75 bpm       ICD Interrogation-  reviewed in detail today,  See PACEART report.  Device History: Medtronic Single Chamber ICD implanted 09/13/2023 for VT  History of AAD therapy: Yes; currently on amiodarone    LHC 09/10/2023 No target lesion   Ramus lesion is 40% stenosed.   Ost Cx to Prox Cx lesion is 40% stenosed.   Dist Cx lesion is 100% stenosed.  Echo 09/11/2023 LVEF 55-60, mild LVH, normal RV, mild MR  Physical Exam:   VS:  BP 124/76   Pulse (!) 48   Ht 5\' 11"  (1.803 m)   Wt 185 lb (83.9 kg)   SpO2 98%   BMI 25.80 kg/m    Wt Readings from Last 3 Encounters:  09/27/23 185 lb (83.9 kg)  09/14/23 178 lb 2.1 oz (80.8 kg)  06/28/23 183 lb 6.4 oz (83.2 kg)     GEN: Well nourished, well developed in no acute distress NECK: No JVD; No carotid bruits CARDIAC:  Regular rate and rhythm, no murmurs, rubs, gallops RESPIRATORY:  Clear to auscultation without rales, wheezing or rhonchi  ABDOMEN: Soft, non-tender, non-distended EXTREMITIES:  No edema; No deformity   ASSESSMENT AND PLAN:    Ventricular arrhythmia  s/p Medtronic single chamber ICD  euvolemic today Stable on an appropriate medical regimen Normal ICD function See Pace Art report No changes today Stable wound check. Ongoing restrictions reviewed.  Decrease amiodarone to 200 mg daily Surveillance labs in 4 weeks (6 weeks post initiation)  CAD s/p CABG Denies s/s ischemia   Disposition:   Follow up with Dr. Lalla Brothers in 3 months   Signed, Graciella Freer, PA-C

## 2023-09-27 ENCOUNTER — Encounter: Payer: Self-pay | Admitting: Student

## 2023-09-27 ENCOUNTER — Ambulatory Visit: Payer: Commercial Managed Care - PPO | Attending: Student | Admitting: Student

## 2023-09-27 ENCOUNTER — Other Ambulatory Visit (HOSPITAL_COMMUNITY): Payer: Self-pay

## 2023-09-27 VITALS — BP 124/76 | HR 48 | Ht 71.0 in | Wt 185.0 lb

## 2023-09-27 DIAGNOSIS — I472 Ventricular tachycardia, unspecified: Secondary | ICD-10-CM | POA: Diagnosis not present

## 2023-09-27 DIAGNOSIS — I4729 Other ventricular tachycardia: Secondary | ICD-10-CM

## 2023-09-27 DIAGNOSIS — Z79899 Other long term (current) drug therapy: Secondary | ICD-10-CM

## 2023-09-27 DIAGNOSIS — I5042 Chronic combined systolic (congestive) and diastolic (congestive) heart failure: Secondary | ICD-10-CM | POA: Diagnosis not present

## 2023-09-27 LAB — CUP PACEART INCLINIC DEVICE CHECK
Battery Remaining Longevity: 167 mo
Battery Voltage: 3.11 V
Brady Statistic RA Percent Paced: INVALID
Brady Statistic RV Percent Paced: 0.08 %
Date Time Interrogation Session: 20241220125621
HighPow Impedance: 45 Ohm
Implantable Lead Connection Status: 753985
Implantable Lead Implant Date: 20241206
Implantable Lead Location: 753860
Implantable Pulse Generator Implant Date: 20241206
Lead Channel Impedance Value: 304 Ohm
Lead Channel Impedance Value: 380 Ohm
Lead Channel Pacing Threshold Amplitude: 0.75 V
Lead Channel Pacing Threshold Amplitude: 0.75 V
Lead Channel Pacing Threshold Pulse Width: 0.4 ms
Lead Channel Pacing Threshold Pulse Width: 0.4 ms
Lead Channel Sensing Intrinsic Amplitude: 6.8 mV
Lead Channel Setting Pacing Amplitude: 3.5 V
Lead Channel Setting Pacing Pulse Width: 0.4 ms
Lead Channel Setting Sensing Sensitivity: 0.3 mV

## 2023-09-27 MED ORDER — AMIODARONE HCL 200 MG PO TABS
200.0000 mg | ORAL_TABLET | Freq: Every day | ORAL | 3 refills | Status: DC
  Filled 2023-09-27 – 2023-11-21 (×2): qty 60, 60d supply, fill #0
  Filled 2024-01-20 (×2): qty 60, 60d supply, fill #1
  Filled 2024-03-26: qty 60, 60d supply, fill #2
  Filled 2024-05-22: qty 60, 60d supply, fill #3

## 2023-09-27 NOTE — Patient Instructions (Signed)
Medication Instructions:  Your physician recommends that you continue on your current medications as directed. Please refer to the Current Medication list given to you today.  *If you need a refill on your cardiac medications before your next appointment, please call your pharmacy*  Lab Work: CMET, TSH, FreeT4-in 1 month If you have labs (blood work) drawn today and your tests are completely normal, you will receive your results only by: MyChart Message (if you have MyChart) OR A paper copy in the mail If you have any lab test that is abnormal or we need to change your treatment, we will call you to review the results.  Follow-Up: At [facility], you and your health needs are our priority.  As part of our continuing mission to provide you with exceptional heart care, we have created designated Provider Care Teams.  These Care Teams include your primary Cardiologist (physician) and Advanced Practice Providers (APPs -  Physician Assistants and Nurse Practitioners) who all work together to provide you with the care you need, when you need it.  We recommend signing up for the patient portal called "MyChart".  Sign up information is provided on this After Visit Summary.  MyChart is used to connect with patients for Virtual Visits (Telemedicine).  Patients are able to view lab/test results, encounter notes, upcoming appointments, etc.  Non-urgent messages can be sent to your provider as well.   To learn more about what you can do with MyChart, go to [email or web address].    Your next appointment:   As scheduled

## 2023-09-30 ENCOUNTER — Other Ambulatory Visit (HOSPITAL_COMMUNITY): Payer: Self-pay

## 2023-10-07 ENCOUNTER — Encounter: Payer: Self-pay | Admitting: Internal Medicine

## 2023-10-14 ENCOUNTER — Other Ambulatory Visit (HOSPITAL_COMMUNITY): Payer: Self-pay

## 2023-10-14 ENCOUNTER — Other Ambulatory Visit: Payer: Self-pay | Admitting: Cardiology

## 2023-10-14 DIAGNOSIS — Z79899 Other long term (current) drug therapy: Secondary | ICD-10-CM | POA: Diagnosis not present

## 2023-10-14 DIAGNOSIS — I5042 Chronic combined systolic (congestive) and diastolic (congestive) heart failure: Secondary | ICD-10-CM | POA: Diagnosis not present

## 2023-10-14 DIAGNOSIS — I4729 Other ventricular tachycardia: Secondary | ICD-10-CM | POA: Diagnosis not present

## 2023-10-14 MED ORDER — METOPROLOL SUCCINATE ER 25 MG PO TB24
25.0000 mg | ORAL_TABLET | Freq: Every day | ORAL | 3 refills | Status: DC
  Filled 2023-10-14: qty 90, 90d supply, fill #0
  Filled 2024-01-14: qty 90, 90d supply, fill #1
  Filled 2024-04-27: qty 90, 90d supply, fill #2

## 2023-10-15 LAB — COMPREHENSIVE METABOLIC PANEL
ALT: 18 [IU]/L (ref 0–44)
AST: 21 [IU]/L (ref 0–40)
Albumin: 4.2 g/dL (ref 3.9–4.9)
Alkaline Phosphatase: 67 [IU]/L (ref 44–121)
BUN/Creatinine Ratio: 13 (ref 10–24)
BUN: 20 mg/dL (ref 8–27)
Bilirubin Total: <0.2 mg/dL (ref 0.0–1.2)
CO2: 20 mmol/L (ref 20–29)
Calcium: 8.9 mg/dL (ref 8.6–10.2)
Chloride: 106 mmol/L (ref 96–106)
Creatinine, Ser: 1.49 mg/dL — ABNORMAL HIGH (ref 0.76–1.27)
Globulin, Total: 2.7 g/dL (ref 1.5–4.5)
Glucose: 84 mg/dL (ref 70–99)
Potassium: 4.7 mmol/L (ref 3.5–5.2)
Sodium: 139 mmol/L (ref 134–144)
Total Protein: 6.9 g/dL (ref 6.0–8.5)
eGFR: 51 mL/min/{1.73_m2} — ABNORMAL LOW (ref >59)

## 2023-10-15 LAB — T4, FREE: Free T4: 1.26 ng/dL (ref 0.82–1.77)

## 2023-10-15 LAB — TSH: TSH: 1.73 u[IU]/mL (ref 0.450–4.500)

## 2023-10-31 DIAGNOSIS — N179 Acute kidney failure, unspecified: Secondary | ICD-10-CM | POA: Diagnosis not present

## 2023-11-06 ENCOUNTER — Ambulatory Visit (AMBULATORY_SURGERY_CENTER): Payer: Commercial Managed Care - PPO

## 2023-11-06 ENCOUNTER — Telehealth: Payer: Self-pay

## 2023-11-06 VITALS — Ht 71.0 in | Wt 170.0 lb

## 2023-11-06 DIAGNOSIS — Z860100 Personal history of colon polyps, unspecified: Secondary | ICD-10-CM

## 2023-11-06 MED ORDER — SUFLAVE 178.7 G PO SOLR: 1.0000 | Freq: Once | ORAL | 0 refills | Status: AC

## 2023-11-06 NOTE — Telephone Encounter (Signed)
Patient scheduled for colonoscopy on 11/26/23; needs anesthesia clearance.

## 2023-11-06 NOTE — Progress Notes (Signed)
No egg or soy allergy known to patient  No issues known to pt with past sedation with any surgeries or procedures Patient denies ever being told they had issues or difficulty with intubation  No FH of Malignant Hyperthermia Pt is not on diet pills Pt is not on  home 02  Pt is not on blood thinners  Pt denies issues with constipation  No A fib or A flutter Have any cardiac testing pending--no Ambulates independently

## 2023-11-07 ENCOUNTER — Other Ambulatory Visit (HOSPITAL_COMMUNITY): Payer: Self-pay

## 2023-11-19 ENCOUNTER — Telehealth: Payer: Self-pay | Admitting: *Deleted

## 2023-11-19 ENCOUNTER — Encounter: Payer: Self-pay | Admitting: Internal Medicine

## 2023-11-19 NOTE — Telephone Encounter (Signed)
Please review for anesthesia clearance.  Thank you!

## 2023-11-19 NOTE — Telephone Encounter (Signed)
Attempted patient contact. Calling to confirm if patient had been in touch with [facility] to have prep sent to him, asked that he call and let us know if he wishes to proceed with colonoscopy and if he needs prep sent to local pharmacy.

## 2023-11-20 NOTE — Telephone Encounter (Signed)
Reached patient.  Patient states that he did initially speak with [facility] and he nas not received prep.   If unable to confirm with [facility], instructions for Miralax will be sent to patient.

## 2023-11-20 NOTE — Telephone Encounter (Signed)
Called patient and left detailed message about new prep instructions for scheduled colonoscopy. Mailed instructions and included Miralax sheet.

## 2023-11-21 ENCOUNTER — Other Ambulatory Visit (HOSPITAL_COMMUNITY): Payer: Self-pay

## 2023-11-21 ENCOUNTER — Other Ambulatory Visit: Payer: Self-pay

## 2023-11-22 ENCOUNTER — Other Ambulatory Visit (HOSPITAL_COMMUNITY): Payer: Self-pay

## 2023-11-25 ENCOUNTER — Telehealth: Payer: Self-pay | Admitting: Internal Medicine

## 2023-11-25 ENCOUNTER — Encounter: Payer: Self-pay | Admitting: Certified Registered Nurse Anesthetist

## 2023-11-25 NOTE — Telephone Encounter (Signed)
Spoke to pt's wife. Need instructions for Miralax prep, as they have not arrived in the mail yet. Instructions printed and left with receptionist. Pt's wife to come pick them up at her convenience.

## 2023-11-25 NOTE — Progress Notes (Unsigned)
[facility] History and Physical   Primary Care Physician:  Irven Coe, MD   Reason for Procedure:   Hx colon polyps  Plan:    colonoscopy     HPI: Nicholas [name] is a 68 y.o. male here for a surveillance colonoscopy  2015 3 small adenomas 2019 diminutive distal hpp  AICD placed 09/2023  Past Medical History:  Diagnosis Date   Arthritis    knees, hands   Cataract    Coronary artery disease    Dysrhythmia 06/2014   sinus brady with PVC'S   GERD (gastroesophageal reflux disease)    Hepatitis C    Hx of adenomatous colonic polyps 09/21/2014   Hypercholesteremia    Hypertension    Myocardial infarction (HCC) 2024    Past Surgical History:  Procedure Laterality Date   CARDIAC CATHETERIZATION     CARDIAC SURGERY     triple bypass   COLONOSCOPY     CORONARY ARTERY BYPASS GRAFT  2000   CABG x 3 Vessels   ICD IMPLANT N/A 09/13/2023   Procedure: ICD IMPLANT;  Surgeon: Lanier Prude, MD;  Location: [facility];  Service: Cardiovascular;  Laterality: N/A;   LEFT HEART CATH AND CORS/GRAFTS ANGIOGRAPHY N/A 06/19/2021   Procedure: LEFT HEART CATH AND CORS/GRAFTS ANGIOGRAPHY;  Surgeon: Lyn Records, MD;  Location: [facility];  Service: Cardiovascular;  Laterality: N/A;   LEFT HEART CATH AND CORS/GRAFTS ANGIOGRAPHY N/A 09/10/2023   Procedure: LEFT HEART CATH AND CORS/GRAFTS ANGIOGRAPHY;  Surgeon: Yates Decamp, MD;  Location: [facility];  Service: Cardiovascular;  Laterality: N/A;    Prior to Admission medications   Medication Sig Start Date End Date Taking? Authorizing Provider  alfuzosin (UROXATRAL) 10 MG 24 hr tablet Take 1 tablet (10 mg total) by mouth daily immediately after meal for 90 days. 08/06/23     amiodarone (PACERONE) 200 MG tablet Take 1 tablet (200 mg total) by mouth daily. 09/27/23   Graciella Freer, PA-C  aspirin EC 81 MG tablet Take 81 mg by mouth daily with breakfast.    [provider]  docusate  sodium (COLACE) 100 MG capsule Take 100 mg by mouth daily.    [provider]  losartan (COZAAR) 25 MG tablet Take 1 tablet (25 mg total) by mouth at bedtime. 04/24/23   Jake Bathe, MD  metoprolol succinate (TOPROL XL) 25 MG 24 hr tablet Take 1 tablet (25 mg total) by mouth daily. 10/14/23   Graciella Freer, PA-C  mexiletine (MEXITIL) 150 MG capsule Take 1 capsule (150 mg total) by mouth every 8 (eight) hours. 09/14/23   Barrett, Joline Salt, PA-C  nitroGLYCERIN (NITROSTAT) 0.4 MG SL tablet Place 1 tablet (0.4 mg total) under the tongue every 5 (five) minutes x 3 doses as needed for chest pain. Patient not taking: Reported on 11/06/2023 02/27/23   Jake Bathe, MD  rosuvastatin (CRESTOR) 40 MG tablet Take 1 tablet (40 mg total) by mouth daily at 6 PM. 07/29/23   Jake Bathe, MD    Current Outpatient Medications  Medication Sig Dispense Refill   alfuzosin (UROXATRAL) 10 MG 24 hr tablet Take 1 tablet (10 mg total) by mouth daily immediately after meal for 90 days. 90 tablet 2   amiodarone (PACERONE) 200 MG tablet Take 1 tablet (200 mg total) by mouth daily. 60 tablet 3   aspirin EC 81 MG tablet Take 81 mg by mouth daily with breakfast.  docusate sodium (COLACE) 100 MG capsule Take 100 mg by mouth daily.     losartan (COZAAR) 25 MG tablet Take 1 tablet (25 mg total) by mouth at bedtime. 90 tablet 2   metoprolol succinate (TOPROL XL) 25 MG 24 hr tablet Take 1 tablet (25 mg total) by mouth daily. 90 tablet 3   mexiletine (MEXITIL) 150 MG capsule Take 1 capsule (150 mg total) by mouth every 8 (eight) hours. 90 capsule 3   rosuvastatin (CRESTOR) 40 MG tablet Take 1 tablet (40 mg total) by mouth daily at 6 PM. 90 tablet 3   nitroGLYCERIN (NITROSTAT) 0.4 MG SL tablet Place 1 tablet (0.4 mg total) under the tongue every 5 (five) minutes x 3 doses as needed for chest pain. (Patient not taking: Reported on 11/06/2023) 25 tablet 11   Current Facility-Administered Medications  Medication  Dose Route Frequency Provider Last Rate Last Admin   0.9 %  sodium chloride infusion  500 mL Intravenous Once Iva Boop, MD        Allergies as of 11/26/2023 - Review Complete 11/26/2023  Allergen Reaction Noted   Atorvastatin calcium Other (See Comments) 09/10/2023   Etomidate Other (See Comments) 09/10/2023    Family History  Problem Relation Age of Onset   Diabetic kidney disease Mother    Kidney disease Mother    Throat cancer Mother    Cancer Mother    Diabetes Mother    Hypertension Mother    Esophageal cancer Mother    Other Father        unknown   Hypertension Father    Heart attack Neg Hx    Colon cancer Neg Hx    Stomach cancer Neg Hx    Rectal cancer Neg Hx    Colon polyps Neg Hx     Social History   Socioeconomic History   Marital status: Married    Spouse name: Not on file   Number of children: Not on file   Years of education: 11   Highest education level: 11th grade  Occupational History   Not on file  Tobacco Use   Smoking status: Former    Current packs/day: 0.00    Types: Cigarettes    Quit date: 06/10/2002    Years since quitting: 21.4   Smokeless tobacco: Never  Vaping Use   Vaping status: Never Used  Substance and Sexual Activity   Alcohol use: Yes    Alcohol/week: 7.0 standard drinks of alcohol    Types: 7 Cans of beer per week   Drug use: Yes    Frequency: 3.0 times per week    Types: Marijuana    Comment: occ   Sexual activity: Yes  Other Topics Concern   Not on file  Social History Narrative   Not on file   Social Drivers of Health   Financial Resource Strain: Not on file  Food Insecurity: No Food Insecurity (09/10/2023)   Hunger Vital Sign    Worried About Running Out of Food in the Last Year: Never true    Ran Out of Food in the Last Year: Never true  Transportation Needs: No Transportation Needs (09/10/2023)   PRAPARE - Administrator, Civil Service (Medical): No    Lack of Transportation (Non-Medical):  No  Physical Activity: Not on file  Stress: Not on file  Social Connections: Not on file  Intimate Partner Violence: Not At Risk (09/10/2023)   Humiliation, Afraid, Rape, and Kick questionnaire  Fear of Current or Ex-Partner: No    Emotionally Abused: No    Physically Abused: No    Sexually Abused: No    Review of Systems:  All other review of systems negative except as mentioned in the HPI.  Physical Exam: Vital signs BP 128/76   Pulse (!) 54   Temp (!) 97.5 F (36.4 C)   Ht 5\' 11"  (1.803 m)   Wt 170 lb (77.1 kg)   SpO2 99%   BMI 23.71 kg/m   General:   Alert,  Well-developed, well-nourished, pleasant and cooperative in NAD Lungs:  Clear throughout to auscultation.   Heart:  Regular rate and rhythm; no murmurs, clicks, rubs,  or gallops. Abdomen:  Soft, nontender and nondistended. Normal bowel sounds.   Neuro/Psych:  Alert and cooperative. Normal mood and affect. A and O x 3   @[place]  Sena Slate, MD, [facility] [phone] (pager) 11/26/2023 9:49 AM@

## 2023-11-25 NOTE — Telephone Encounter (Signed)
Inbound call from patient's wife requesting to know if she can pick up prep instructions. Stated they have not received instructions through the mail. Requesting a call back. Please advise, thank you.

## 2023-11-26 ENCOUNTER — Ambulatory Visit (AMBULATORY_SURGERY_CENTER): Payer: Commercial Managed Care - PPO | Admitting: Internal Medicine

## 2023-11-26 ENCOUNTER — Encounter: Payer: Self-pay | Admitting: Internal Medicine

## 2023-11-26 VITALS — BP 153/88 | HR 45 | Temp 97.5°F | Resp 15 | Ht 71.0 in | Wt 170.0 lb

## 2023-11-26 DIAGNOSIS — E7800 Pure hypercholesterolemia, unspecified: Secondary | ICD-10-CM | POA: Diagnosis not present

## 2023-11-26 DIAGNOSIS — Z860100 Personal history of colon polyps, unspecified: Secondary | ICD-10-CM

## 2023-11-26 DIAGNOSIS — D125 Benign neoplasm of sigmoid colon: Secondary | ICD-10-CM

## 2023-11-26 DIAGNOSIS — Z1211 Encounter for screening for malignant neoplasm of colon: Secondary | ICD-10-CM | POA: Diagnosis not present

## 2023-11-26 DIAGNOSIS — N429 Disorder of prostate, unspecified: Secondary | ICD-10-CM | POA: Diagnosis not present

## 2023-11-26 DIAGNOSIS — K635 Polyp of colon: Secondary | ICD-10-CM

## 2023-11-26 DIAGNOSIS — Z860101 Personal history of adenomatous and serrated colon polyps: Secondary | ICD-10-CM | POA: Diagnosis not present

## 2023-11-26 DIAGNOSIS — I2510 Atherosclerotic heart disease of native coronary artery without angina pectoris: Secondary | ICD-10-CM | POA: Diagnosis not present

## 2023-11-26 DIAGNOSIS — I10 Essential (primary) hypertension: Secondary | ICD-10-CM | POA: Diagnosis not present

## 2023-11-26 MED ORDER — SODIUM CHLORIDE 0.9 % IV SOLN: 500.0000 mL | Freq: Once | INTRAVENOUS | Status: DC

## 2023-11-26 NOTE — Progress Notes (Signed)
 Called to room to assist during endoscopic procedure.  Patient ID and intended procedure confirmed with present staff. Received instructions for my participation in the procedure from the performing physician.

## 2023-11-26 NOTE — Patient Instructions (Addendum)
I found and removed 6 small polyps.  I will let you know pathology results and when to have another routine colonoscopy by mail and/or My Chart.  I appreciate the opportunity to care for you. Iva Boop, MD, [facility]  Handout provided on polyps.  YOU HAD AN ENDOSCOPIC PROCEDURE TODAY AT THE [facility]:   Refer to the procedure report that was given to you for any specific questions about what was found during the examination.  If the procedure report does not answer your questions, please call your gastroenterologist to clarify.  If you requested that your care partner not be given the details of your procedure findings, then the procedure report has been included in a sealed envelope for you to review at your convenience later.  YOU SHOULD EXPECT: Some feelings of bloating in the abdomen. Passage of more gas than usual.  Walking can help get rid of the air that was put into your GI tract during the procedure and reduce the bloating. If you had a lower endoscopy (such as a colonoscopy or flexible sigmoidoscopy) you may notice spotting of blood in your stool or on the toilet paper. If you underwent a bowel prep for your procedure, you may not have a normal bowel movement for a few days.  Please Note:  You might notice some irritation and congestion in your nose or some drainage.  This is from the oxygen used during your procedure.  There is no need for concern and it should clear up in a day or so.  SYMPTOMS TO REPORT IMMEDIATELY:  Following lower endoscopy (colonoscopy or flexible sigmoidoscopy):  Excessive amounts of blood in the stool  Significant tenderness or worsening of abdominal pains  Swelling of the abdomen that is new, acute  Fever of 100F or higher  For urgent or emergent issues, a gastroenterologist can be reached at any hour by calling [phone]. Do not use MyChart messaging for urgent concerns.    DIET:  We do recommend a small meal at first, but then you  may proceed to your regular diet.  Drink plenty of fluids but you should avoid alcoholic beverages for 24 hours.  ACTIVITY:  You should plan to take it easy for the rest of today and you should NOT DRIVE or use heavy machinery until tomorrow (because of the sedation medicines used during the test).    FOLLOW UP: Our staff will call the number listed on your records the next business day following your procedure.  We will call around 7:15- 8:00 am to check on you and address any questions or concerns that you may have regarding the information given to you following your procedure. If we do not reach you, we will leave a message.     If any biopsies were taken you will be contacted by phone or by letter within the next 1-3 weeks.  Please call us at [phone] if you have not heard about the biopsies in 3 weeks.    SIGNATURES/CONFIDENTIALITY: You and/or your care partner have signed paperwork which will be entered into your electronic medical record.  These signatures attest to the fact that that the information above on your After Visit Summary has been reviewed and is understood.  Full responsibility of the confidentiality of this discharge information lies with you and/or your care-partner.

## 2023-11-26 NOTE — Op Note (Signed)
[facility] Patient Name: Nicholas [name] Procedure Date: 11/26/2023 9:42 AM MRN: [id number] Endoscopist: Iva Boop , MD, [id number] Age: 68 Referring MD:  Date of Birth: March 12, 1956 Gender: Male Account #: [id number] Procedure:                Colonoscopy Indications:              Surveillance: Personal history of adenomatous                            polyps on last colonoscopy > 5 years ago, Last                            colonoscopy: 2019 Medicines:                Monitored Anesthesia Care Procedure:                Pre-Anesthesia Assessment:                           - Prior to the procedure, a History and Physical                            was performed, and patient medications and                            allergies were reviewed. The patient's tolerance of                            previous anesthesia was also reviewed. The risks                            and benefits of the procedure and the sedation                            options and risks were discussed with the patient.                            All questions were answered, and informed consent                            was obtained. Prior Anticoagulants: The patient has                            taken no anticoagulant or antiplatelet agents. ASA                            Grade Assessment: III - A patient with severe                            systemic disease. After reviewing the risks and                            benefits, the patient was deemed in satisfactory  condition to undergo the procedure.                           After obtaining informed consent, the colonoscope                            was passed under direct vision. Throughout the                            procedure, the patient's blood pressure, pulse, and                            oxygen saturations were monitored continuously. The                            Olympus Scope SN: [id number] was introduced  through                            the anus and advanced to the the cecum, identified                            by appendiceal orifice and ileocecal valve. The                            colonoscopy was performed without difficulty. The                            patient tolerated the procedure well. The quality                            of the bowel preparation was good. The ileocecal                            valve, appendiceal orifice, and rectum were                            photographed. Scope In: 9:58:26 AM Scope Out: 10:15:23 AM Scope Withdrawal Time: 0 hours 13 minutes 17 seconds  Total Procedure Duration: 0 hours 16 minutes 57 seconds  Findings:                 The digital rectal exam findings include enlarged                            prostate. Pertinent negatives include no palpable                            rectal lesions.                           Six sessile polyps were found in the sigmoid colon.                            The polyps were diminutive in size. These polyps  were removed with a cold snare. Resection and                            retrieval were complete. Verification of patient                            identification for the specimen was done. Estimated                            blood loss was minimal.                           The exam was otherwise without abnormality on                            direct and retroflexion views. Complications:            No immediate complications. Estimated Blood Loss:     Estimated blood loss was minimal. Impression:               - Enlarged prostate found on digital rectal exam.                           - Six diminutive polyps in the sigmoid colon,                            removed with a cold snare. Resected and retrieved.                           - The examination was otherwise normal on direct                            and retroflexion views.                           - Personal  history of colonic polyps.                           2015 3 small adenomas                           2019 diminutive distal hpp Recommendation:           - Patient has a contact number available for                            emergencies. The signs and symptoms of potential                            delayed complications were discussed with the                            patient. Return to normal activities tomorrow.                            Written discharge instructions were provided to the  patient.                           - Resume previous diet.                           - Continue present medications.                           - Await pathology results.                           - Repeat colonoscopy is recommended for                            surveillance. The colonoscopy date will be                            determined after pathology results from today's                            exam become available for review. Iva Boop, MD 11/26/2023 10:25:28 AM This report has been signed electronically.

## 2023-11-26 NOTE — Progress Notes (Signed)
 Pt's states no medical or surgical changes since previsit or office visit.

## 2023-11-26 NOTE — Progress Notes (Signed)
 Report given to PACU, vss

## 2023-11-27 NOTE — Telephone Encounter (Signed)
No answer on follow up call. Unable to leave message.  

## 2023-11-28 ENCOUNTER — Encounter: Payer: Self-pay | Admitting: Internal Medicine

## 2023-11-28 DIAGNOSIS — Z860101 Personal history of adenomatous and serrated colon polyps: Secondary | ICD-10-CM

## 2023-11-28 LAB — SURGICAL PATHOLOGY

## 2023-12-17 ENCOUNTER — Ambulatory Visit: Payer: Commercial Managed Care - PPO

## 2023-12-17 DIAGNOSIS — I4729 Other ventricular tachycardia: Secondary | ICD-10-CM | POA: Diagnosis not present

## 2023-12-17 LAB — CUP PACEART REMOTE DEVICE CHECK
Battery Remaining Longevity: 164 mo
Battery Voltage: 3.1 V
Brady Statistic RA Percent Paced: INVALID
Brady Statistic RV Percent Paced: 0.51 %
Date Time Interrogation Session: 20250311041738
HighPow Impedance: 53 Ohm
Implantable Lead Connection Status: 753985
Implantable Lead Implant Date: 20241206
Implantable Lead Location: 753860
Implantable Pulse Generator Implant Date: 20241206
Lead Channel Impedance Value: 361 Ohm
Lead Channel Impedance Value: 437 Ohm
Lead Channel Pacing Threshold Amplitude: 0.625 V
Lead Channel Pacing Threshold Pulse Width: 0.4 ms
Lead Channel Sensing Intrinsic Amplitude: 7.4 mV
Lead Channel Setting Pacing Amplitude: 2 V
Lead Channel Setting Pacing Pulse Width: 0.4 ms
Lead Channel Setting Sensing Sensitivity: 0.3 mV

## 2023-12-23 ENCOUNTER — Other Ambulatory Visit (HOSPITAL_COMMUNITY): Payer: Self-pay

## 2023-12-26 ENCOUNTER — Other Ambulatory Visit: Payer: Self-pay

## 2023-12-26 ENCOUNTER — Ambulatory Visit: Payer: Commercial Managed Care - PPO | Attending: Cardiology | Admitting: Cardiology

## 2023-12-26 ENCOUNTER — Encounter: Payer: Self-pay | Admitting: Cardiology

## 2023-12-26 VITALS — BP 170/100 | HR 58 | Ht 71.0 in | Wt 187.2 lb

## 2023-12-26 DIAGNOSIS — I5042 Chronic combined systolic (congestive) and diastolic (congestive) heart failure: Secondary | ICD-10-CM

## 2023-12-26 DIAGNOSIS — Z79899 Other long term (current) drug therapy: Secondary | ICD-10-CM

## 2023-12-26 DIAGNOSIS — I10 Essential (primary) hypertension: Secondary | ICD-10-CM | POA: Diagnosis not present

## 2023-12-26 DIAGNOSIS — Z95810 Presence of automatic (implantable) cardiac defibrillator: Secondary | ICD-10-CM

## 2023-12-26 DIAGNOSIS — I472 Ventricular tachycardia, unspecified: Secondary | ICD-10-CM | POA: Diagnosis not present

## 2023-12-26 NOTE — Progress Notes (Signed)
  Electrophysiology Office Follow up Visit Note:    Date:  12/26/2023   ID:  Nicholas [name], DOB 02-20-1956, MRN [id number]  PCP:  Irven Coe, MD  [facility] Cardiologist:  Donato Schultz, MD  [facility] Electrophysiologist:  Lanier Prude, MD    Interval History:     DAWON TROOP is a 68 y.o. male who presents for a follow up visit.   Mr. Barocio was seen by Mardelle Matte on September 27, 2023.  He has a history of ischemic cardiomyopathy and monomorphic ventricular tachycardia.  At that appointment he was doing well.  Today he reports intermittent shortness of breath and lower extremity edema.  He said the edema has worsened since he stopped his hydrochlorothiazide.      Past medical, surgical, social and family history were reviewed.  ROS:   Please see the history of present illness.    All other systems reviewed and are negative.  EKGs/Labs/Other Studies Reviewed:    The following studies were reviewed today:  December 26, 2023 in clinic device interrogation personally reviewed Battery longevity 13.6 years Lead parameter stable Normal device function No high-voltage therapies OptiVol okay          Physical Exam:    VS:  BP (!) 170/100 (BP Location: Left Arm, Patient Position: Sitting, Cuff Size: Normal)   Pulse (!) 58   Ht 5\' 11"  (1.803 m)   Wt 187 lb 3.2 oz (84.9 kg)   SpO2 97%   BMI 26.11 kg/m     Wt Readings from Last 3 Encounters:  12/26/23 187 lb 3.2 oz (84.9 kg)  11/26/23 170 lb (77.1 kg)  11/06/23 170 lb (77.1 kg)     GEN: no distress CARD: RRR, No MRG.  CIED pocket well-healed. RESP: No IWOB. CTAB.      ASSESSMENT:    1. VT (ventricular tachycardia) (HCC)   2. Chronic combined systolic and diastolic CHF (congestive heart failure) (HCC)   3. Encounter for long-term (current) use of high-risk medication   4. Primary hypertension   5. Implantable cardioverter-defibrillator (ICD) in situ    PLAN:    In order of problems listed  above:  #Chronic systolic heart failure NYHA class II.  Warm and dry on exam.  His heart failure has been complicated by ventricular arrhythmias requiring amiodarone and ICD implant.  On exam he appears euvolemic but he reports some swelling in his lower extremities that is intermittent.  I suspect he would benefit from hydrochlorothiazide if his renal function allows.  I will check a CMP, TSH and free T4 today.  If his kidney function is okay, favor starting.  Continue metoprolol, losartan.  #Hypertension Above goal today.  Recommend checking blood pressures 1-2 times per week at home and recording the values.  Recommend bringing these recordings to the primary care physician. Check CMP as above.  Favor starting hydrochlorothiazide if renal function allows.  I am also going to refer him to the hypertension clinic.  #Ventricular tachycardia #ICD in situ #High risk med monitoring-amiodarone No high-voltage therapies since implant.  ICD is functioning appropriately. Continue amiodarone and mexiletine. Repeat CMP, TSH and free T4 today.  Follow-up with EP APP in 6 months.  Signed, Steffanie Dunn, MD, [facility], [facility] 12/26/2023 3:07 PM    Electrophysiology [facility]

## 2023-12-26 NOTE — Patient Instructions (Signed)
 Medication Instructions:  Your physician recommends that you continue on your current medications as directed. Please refer to the Current Medication list given to you today.  *If you need a refill on your cardiac medications before your next appointment, please call your pharmacy*   Lab Work: TODAY: CMET, TSH, and T4  Follow-Up: At [facility], you and your health needs are our priority.  As part of our continuing mission to provide you with exceptional heart care, we have created designated Provider Care Teams.  These Care Teams include your primary Cardiologist (physician) and Advanced Practice Providers (APPs -  Physician Assistants and Nurse Practitioners) who all work together to provide you with the care you need, when you need it.  Your next appointment:   6 months  Provider:   You will see one of the following Advanced Practice Providers on your designated Care Team:   Francis Dowse, Charlott Holler "Mardelle Matte" [place], [place] Sherie Don, NP Canary Brim, NP  Other Instructions You have been referred to see PharmD in the Hypertension Clinic

## 2023-12-27 LAB — CUP PACEART INCLINIC DEVICE CHECK
Date Time Interrogation Session: 20250320083852
Implantable Lead Connection Status: 753985
Implantable Lead Implant Date: 20241206
Implantable Lead Location: 753860
Implantable Pulse Generator Implant Date: 20241206

## 2023-12-27 LAB — COMPREHENSIVE METABOLIC PANEL
ALT: 24 IU/L (ref 0–44)
AST: 31 IU/L (ref 0–40)
Albumin: 4.3 g/dL (ref 3.9–4.9)
Alkaline Phosphatase: 70 IU/L (ref 44–121)
BUN/Creatinine Ratio: 13 (ref 10–24)
BUN: 18 mg/dL (ref 8–27)
Bilirubin Total: <0.2 mg/dL (ref 0.0–1.2)
CO2: 22 mmol/L (ref 20–29)
Calcium: 9.3 mg/dL (ref 8.6–10.2)
Chloride: 106 mmol/L (ref 96–106)
Creatinine, Ser: 1.38 mg/dL — ABNORMAL HIGH (ref 0.76–1.27)
Globulin, Total: 2.8 g/dL (ref 1.5–4.5)
Glucose: 87 mg/dL (ref 70–99)
Potassium: 4.5 mmol/L (ref 3.5–5.2)
Sodium: 140 mmol/L (ref 134–144)
Total Protein: 7.1 g/dL (ref 6.0–8.5)
eGFR: 56 mL/min/{1.73_m2} — ABNORMAL LOW (ref >59)

## 2023-12-27 LAB — TSH: TSH: 1.49 u[IU]/mL (ref 0.450–4.500)

## 2023-12-27 LAB — T4, FREE: Free T4: 1.35 ng/dL (ref 0.82–1.77)

## 2024-01-20 ENCOUNTER — Other Ambulatory Visit (HOSPITAL_COMMUNITY): Payer: Self-pay

## 2024-01-20 ENCOUNTER — Other Ambulatory Visit: Payer: Self-pay | Admitting: Cardiology

## 2024-01-21 ENCOUNTER — Other Ambulatory Visit: Payer: Self-pay

## 2024-01-21 ENCOUNTER — Other Ambulatory Visit: Payer: Self-pay | Admitting: Cardiology

## 2024-01-21 ENCOUNTER — Other Ambulatory Visit (HOSPITAL_COMMUNITY): Payer: Self-pay

## 2024-01-21 MED ORDER — LOSARTAN POTASSIUM 25 MG PO TABS
25.0000 mg | ORAL_TABLET | Freq: Every day | ORAL | 3 refills | Status: AC
  Filled 2024-01-21: qty 90, 90d supply, fill #0
  Filled 2024-04-20: qty 90, 90d supply, fill #1
  Filled 2024-07-13: qty 90, 90d supply, fill #2
  Filled 2024-10-12: qty 90, 90d supply, fill #3

## 2024-01-23 ENCOUNTER — Other Ambulatory Visit: Payer: Self-pay

## 2024-01-23 ENCOUNTER — Telehealth: Payer: Self-pay | Admitting: Cardiology

## 2024-01-23 ENCOUNTER — Other Ambulatory Visit (HOSPITAL_COMMUNITY): Payer: Self-pay

## 2024-01-23 ENCOUNTER — Other Ambulatory Visit: Payer: Self-pay | Admitting: Physician Assistant

## 2024-01-23 MED ORDER — MEXILETINE HCL 150 MG PO CAPS
150.0000 mg | ORAL_CAPSULE | Freq: Three times a day (TID) | ORAL | 3 refills | Status: DC
  Filled 2024-01-23: qty 151, 50d supply, fill #0
  Filled 2024-01-23: qty 119, 40d supply, fill #0
  Filled 2024-04-27: qty 122, 41d supply, fill #1
  Filled 2024-04-27: qty 148, 49d supply, fill #1
  Filled 2024-05-07: qty 270, 90d supply, fill #1

## 2024-01-23 NOTE — Telephone Encounter (Signed)
 Pt's medication was already sent to pt's pharmacy as requested. Confirmation received.

## 2024-01-23 NOTE — Telephone Encounter (Signed)
*  STAT* If patient is at the pharmacy, call can be transferred to refill team.   1. Which medications need to be refilled? (please list name of each medication and dose if known)   mexiletine (MEXITIL) 150 MG capsule    2. Which pharmacy/location (including street and city if local pharmacy) is medication to be sent to? [place] [facility]    3. Do they need a 30 day or 90 day supply? 90

## 2024-02-03 DIAGNOSIS — I2510 Atherosclerotic heart disease of native coronary artery without angina pectoris: Secondary | ICD-10-CM | POA: Diagnosis not present

## 2024-02-03 DIAGNOSIS — I10 Essential (primary) hypertension: Secondary | ICD-10-CM | POA: Diagnosis not present

## 2024-02-03 DIAGNOSIS — I471 Supraventricular tachycardia, unspecified: Secondary | ICD-10-CM | POA: Diagnosis not present

## 2024-02-03 NOTE — Progress Notes (Signed)
 Remote ICD transmission.

## 2024-02-05 DIAGNOSIS — N400 Enlarged prostate without lower urinary tract symptoms: Secondary | ICD-10-CM | POA: Diagnosis not present

## 2024-02-05 DIAGNOSIS — R5383 Other fatigue: Secondary | ICD-10-CM | POA: Diagnosis not present

## 2024-02-05 DIAGNOSIS — I471 Supraventricular tachycardia, unspecified: Secondary | ICD-10-CM | POA: Diagnosis not present

## 2024-02-05 DIAGNOSIS — K219 Gastro-esophageal reflux disease without esophagitis: Secondary | ICD-10-CM | POA: Diagnosis not present

## 2024-02-05 DIAGNOSIS — Z95810 Presence of automatic (implantable) cardiac defibrillator: Secondary | ICD-10-CM | POA: Diagnosis not present

## 2024-02-05 DIAGNOSIS — I2510 Atherosclerotic heart disease of native coronary artery without angina pectoris: Secondary | ICD-10-CM | POA: Diagnosis not present

## 2024-02-05 DIAGNOSIS — I255 Ischemic cardiomyopathy: Secondary | ICD-10-CM | POA: Diagnosis not present

## 2024-02-05 DIAGNOSIS — E7800 Pure hypercholesterolemia, unspecified: Secondary | ICD-10-CM | POA: Diagnosis not present

## 2024-02-05 DIAGNOSIS — I10 Essential (primary) hypertension: Secondary | ICD-10-CM | POA: Diagnosis not present

## 2024-02-24 ENCOUNTER — Ambulatory Visit: Admitting: Pharmacist Clinician (PhC)/ Clinical Pharmacy Specialist

## 2024-03-17 ENCOUNTER — Ambulatory Visit: Payer: Commercial Managed Care - PPO | Attending: Cardiology

## 2024-03-17 DIAGNOSIS — I472 Ventricular tachycardia, unspecified: Secondary | ICD-10-CM | POA: Diagnosis not present

## 2024-03-17 LAB — CUP PACEART REMOTE DEVICE CHECK
Battery Remaining Longevity: 162 mo
Battery Voltage: 3.08 V
Brady Statistic RA Percent Paced: INVALID
Brady Statistic RV Percent Paced: 0.4 %
Date Time Interrogation Session: 20250610022727
HighPow Impedance: 55 Ohm
Implantable Lead Connection Status: 753985
Implantable Lead Implant Date: 20241206
Implantable Lead Location: 753860
Implantable Pulse Generator Implant Date: 20241206
Lead Channel Impedance Value: 323 Ohm
Lead Channel Impedance Value: 399 Ohm
Lead Channel Pacing Threshold Amplitude: 0.75 V
Lead Channel Pacing Threshold Pulse Width: 0.4 ms
Lead Channel Sensing Intrinsic Amplitude: 6.1 mV
Lead Channel Setting Pacing Amplitude: 2 V
Lead Channel Setting Pacing Pulse Width: 0.4 ms
Lead Channel Setting Sensing Sensitivity: 0.3 mV

## 2024-03-21 ENCOUNTER — Ambulatory Visit: Payer: Self-pay | Admitting: Cardiology

## 2024-03-23 ENCOUNTER — Emergency Department (HOSPITAL_COMMUNITY)
Admission: EM | Admit: 2024-03-23 | Discharge: 2024-03-23 | Disposition: A | Discharge status: Home / Self Care | Attending: Emergency Medicine | Admitting: Emergency Medicine

## 2024-03-23 ENCOUNTER — Other Ambulatory Visit: Payer: Self-pay

## 2024-03-23 ENCOUNTER — Other Ambulatory Visit (HOSPITAL_COMMUNITY): Payer: Self-pay

## 2024-03-23 ENCOUNTER — Encounter (HOSPITAL_COMMUNITY): Payer: Self-pay

## 2024-03-23 DIAGNOSIS — R338 Other retention of urine: Secondary | ICD-10-CM | POA: Diagnosis not present

## 2024-03-23 DIAGNOSIS — R339 Retention of urine, unspecified: Secondary | ICD-10-CM | POA: Diagnosis not present

## 2024-03-23 DIAGNOSIS — Z7982 Long term (current) use of aspirin: Secondary | ICD-10-CM | POA: Insufficient documentation

## 2024-03-23 LAB — URINALYSIS, W/ REFLEX TO CULTURE (INFECTION SUSPECTED)
Bilirubin Urine: NEGATIVE
Glucose, UA: NEGATIVE mg/dL
Ketones, ur: NEGATIVE mg/dL
Nitrite: POSITIVE — AB
Protein, ur: 30 mg/dL — AB
Specific Gravity, Urine: 1.013 (ref 1.005–1.030)
WBC, UA: >50 WBC/hpf (ref 0–5)
pH: 5 (ref 5.0–8.0)

## 2024-03-23 MED ORDER — CEPHALEXIN 500 MG PO CAPS
500.0000 mg | ORAL_CAPSULE | Freq: Two times a day (BID) | ORAL | 0 refills | Status: AC
  Filled 2024-03-23: qty 14, 7d supply, fill #0

## 2024-03-23 MED ORDER — ACETAMINOPHEN 500 MG PO TABS
1000.0000 mg | ORAL_TABLET | Freq: Once | ORAL | Status: AC
  Administered 2024-03-23: 1000 mg via ORAL
  Filled 2024-03-23: qty 2

## 2024-03-23 MED ORDER — CEPHALEXIN 500 MG PO CAPS
500.0000 mg | ORAL_CAPSULE | Freq: Once | ORAL | Status: AC
  Administered 2024-03-23: 500 mg via ORAL
  Filled 2024-03-23: qty 1

## 2024-03-23 NOTE — Discharge Instructions (Signed)
 With your acute urinary retention is important to follow-up with our urology colleagues as well as with your primary care physician.  With some consideration of infection, though your vital signs are stable, you will be taking an antibiotic.  Return here for concerning changes in your condition.

## 2024-03-23 NOTE — ED Triage Notes (Signed)
 Patient said he got up 15 times last night trying to urinate. Its trickling out. Last time he was able to fully empty the bladder was Saturday. Has enlarged prostate.

## 2024-03-23 NOTE — ED Provider Notes (Signed)
 [place] EMERGENCY DEPARTMENT AT [facility] Provider Note   CSN: [id number] Arrival date & time: 03/23/24  2952     Patient presents with: Urinary Retention   Nicholas [name] is a 68 y.o. male.   HPI Patient presents with inability urinate.  He has known prostatomegaly, over the past day has had near minimal urine production in spite of urge to go with great frequency. No fever, chills, nausea, vomiting, chest pain abdominal pain. Patient does not have a urologist in the office, has had his urologic care provided by his primary care physician thus far.     Prior to Admission medications   Medication Sig Start Date End Date Taking? Authorizing Provider  cephALEXin (KEFLEX) 500 MG capsule Take 1 capsule (500 mg total) by mouth 2 (two) times daily for 7 days. 03/23/24 03/30/24 Yes Dorenda Gandy, MD  alfuzosin  (UROXATRAL ) 10 MG 24 hr tablet Take 1 tablet (10 mg total) by mouth daily immediately after meal for 90 days. 08/06/23     amiodarone  (PACERONE ) 200 MG tablet Take 1 tablet (200 mg total) by mouth daily. 09/27/23   Tylene Galla, PA-C  aspirin  EC 81 MG tablet Take 81 mg by mouth daily with breakfast.    [provider]  docusate sodium (COLACE) 100 MG capsule Take 100 mg by mouth daily.    [provider]  losartan  (COZAAR ) 25 MG tablet Take 1 tablet (25 mg total) by mouth at bedtime. 01/21/24   Hugh Madura, MD  metoprolol  succinate (TOPROL  XL) 25 MG 24 hr tablet Take 1 tablet (25 mg total) by mouth daily. 10/14/23   Tylene Galla, PA-C  mexiletine (MEXITIL ) 150 MG capsule Take 1 capsule (150 mg total) by mouth every 8 (eight) hours. 01/23/24   Boyce Byes, MD  nitroGLYCERIN  (NITROSTAT ) 0.4 MG SL tablet Place 1 tablet (0.4 mg total) under the tongue every 5 (five) minutes x 3 doses as needed for chest pain. 02/27/23   Hugh Madura, MD  rosuvastatin  (CRESTOR ) 40 MG tablet Take 1 tablet (40 mg total) by mouth daily at 6 PM.  07/29/23   Hugh Madura, MD    Allergies: Atorvastatin calcium  and Etomidate     Review of Systems  Updated Vital Signs BP (!) 140/83   Pulse 69   Temp 99.5 F (37.5 C) (Oral)   Resp 18   Ht 5' 11 (1.803 m)   Wt 79.4 kg   SpO2 100%   BMI 24.41 kg/m   Physical Exam Vitals and nursing note reviewed.  Constitutional:      General: He is not in acute distress.    Appearance: He is well-developed.  HENT:     Head: Normocephalic and atraumatic.   Eyes:     Conjunctiva/sclera: Conjunctivae normal.    Cardiovascular:     Rate and Rhythm: Normal rate and regular rhythm.  Pulmonary:     Effort: Pulmonary effort is normal. No respiratory distress.     Breath sounds: No stridor.  Abdominal:     General: There is no distension.     Tenderness: There is no abdominal tenderness. There is no guarding.     Comments: Mild suprapubic discomfort   Skin:    General: Skin is warm and dry.   Neurological:     Mental Status: He is alert and oriented to person, place, and time.     (all labs ordered are listed, but only abnormal results are displayed) Labs Reviewed  URINALYSIS, W/ REFLEX TO CULTURE (INFECTION SUSPECTED) - Abnormal; Notable for the following components:      Result Value   APPearance HAZY (*)    Hgb urine dipstick MODERATE (*)    Protein, ur 30 (*)    Nitrite POSITIVE (*)    Leukocytes,Ua LARGE (*)    Bacteria, UA FEW (*)    All other components within normal limits  URINE CULTURE    EKG: None  Radiology: No results found.   Procedures   Medications Ordered in the ED  cephALEXin (KEFLEX) capsule 500 mg (has no administration in time range)                                    Medical Decision Making Adult male presents with inability to urinate, some mild production just prior to ED arrival.  Given his history of prostatic megaly, concern for acute urinary retention, prostatitis, urinary tract infection, cystitis.  Bedside ultrasound by nursing  with nearly 200 mL urine, patient amenable to Foley catheter placement which was performed by nursing staff. Urinalysis abnormal, no evidence for bacteremia, sepsis, patient started on antibiotics will follow-up with urology and primary care.  Amount and/or Complexity of Data Reviewed Independent Historian: spouse Labs: ordered. Decision-making details documented in ED Course.  Risk Prescription drug management. Decision regarding hospitalization.   11:28 AM On repeat exam patient in no distress, awake, alert, catheter placed without complication.  Final diagnoses:  Acute urinary retention    ED Discharge Orders          Ordered    cephALEXin (KEFLEX) 500 MG capsule  2 times daily        03/23/24 1126               Dorenda Gandy, MD 03/23/24 1128

## 2024-03-25 LAB — URINE CULTURE: Culture: >=100000 — AB

## 2024-03-26 ENCOUNTER — Telehealth (HOSPITAL_BASED_OUTPATIENT_CLINIC_OR_DEPARTMENT_OTHER): Payer: Self-pay | Admitting: *Deleted

## 2024-03-26 NOTE — Telephone Encounter (Signed)
 Post ED Visit - Positive Culture Follow-up  Culture report reviewed by antimicrobial stewardship pharmacist: [facility] []  [address], Pharm.D. []  Skeet Duke, Pharm.D., BCPS AQ-ID []  Leslee Rase, Pharm.D., BCPS []  Garland Junk, [address].D., BCPS []  [place], [place].D., BCPS, AAHIVP []  Alcide Aly, Pharm.D., BCPS, AAHIVP []  Jerri Morale, PharmD, BCPS []  Graham Laws, PharmD, BCPS []  Cleda Curly, PharmD, BCPS []  Tamar Fairly, PharmD []  Ballard Levels, PharmD, BCPS []  Ollen Beverage, PharmD  [facility] [x]  Denson Flake, PharmD []  Sherryle Don, PharmD []  Van Gelinas, PharmD []  Delila Felty, Rph []  [place]) Cleora Daft, PharmD []  Augustina Block, PharmD []  Arie Kurtz, PharmD []  Sharlyn Deaner, PharmD []  Agnes Hose, PharmD []  Kendall Pauls, PharmD []  Gladstone Lamer, PharmD []  Armanda Bern, PharmD []  Tera Fellows, PharmD   Positive urine culture Treated with cephalexin, organism sensitive to the same and no further patient follow-up is required at this time.  Lovell Rubenstein Sharion Davidson 03/26/2024, 1:15 PM

## 2024-04-01 ENCOUNTER — Other Ambulatory Visit (HOSPITAL_COMMUNITY): Payer: Self-pay

## 2024-04-01 DIAGNOSIS — N401 Enlarged prostate with lower urinary tract symptoms: Secondary | ICD-10-CM | POA: Diagnosis not present

## 2024-04-01 DIAGNOSIS — R338 Other retention of urine: Secondary | ICD-10-CM | POA: Diagnosis not present

## 2024-04-01 DIAGNOSIS — R350 Frequency of micturition: Secondary | ICD-10-CM | POA: Diagnosis not present

## 2024-04-01 MED ORDER — TAMSULOSIN HCL 0.4 MG PO CAPS
0.4000 mg | ORAL_CAPSULE | Freq: Every day | ORAL | 1 refills | Status: DC
  Filled 2024-04-01: qty 30, 30d supply, fill #0

## 2024-04-06 DIAGNOSIS — R351 Nocturia: Secondary | ICD-10-CM | POA: Diagnosis not present

## 2024-04-06 DIAGNOSIS — R972 Elevated prostate specific antigen [PSA]: Secondary | ICD-10-CM | POA: Diagnosis not present

## 2024-04-06 DIAGNOSIS — R3912 Poor urinary stream: Secondary | ICD-10-CM | POA: Diagnosis not present

## 2024-04-06 DIAGNOSIS — R350 Frequency of micturition: Secondary | ICD-10-CM | POA: Diagnosis not present

## 2024-04-06 DIAGNOSIS — N401 Enlarged prostate with lower urinary tract symptoms: Secondary | ICD-10-CM | POA: Diagnosis not present

## 2024-04-06 DIAGNOSIS — R3915 Urgency of urination: Secondary | ICD-10-CM | POA: Diagnosis not present

## 2024-04-08 ENCOUNTER — Other Ambulatory Visit (HOSPITAL_COMMUNITY): Payer: Self-pay

## 2024-04-08 ENCOUNTER — Encounter: Payer: Self-pay | Admitting: Pharmacist Clinician (PhC)/ Clinical Pharmacy Specialist

## 2024-04-08 ENCOUNTER — Ambulatory Visit: Attending: Cardiology | Admitting: Pharmacist Clinician (PhC)/ Clinical Pharmacy Specialist

## 2024-04-08 VITALS — BP 108/66 | HR 66

## 2024-04-08 DIAGNOSIS — I10 Essential (primary) hypertension: Secondary | ICD-10-CM

## 2024-04-08 MED ORDER — AMOXICILLIN-POT CLAVULANATE 875-125 MG PO TABS
1.0000 | ORAL_TABLET | Freq: Two times a day (BID) | ORAL | 0 refills | Status: DC
  Filled 2024-04-08: qty 14, 7d supply, fill #0

## 2024-04-08 NOTE — Assessment & Plan Note (Signed)
 Assessment: BP is controlled in office BP 108/66 mmHg. Home and work readings higher, average 126/84 Tolerates losartan  well, without any side effects Complains about increase in fatigue since starting medications Reiterated the importance of regular exercise and low salt diet   Plan:  Decrease metoprolol  succ to 12.5 mg once daily Increase losartan  to 25 mg twice daily Patient to keep record of BP readings with heart rate and report to us  at the next visit - will bring home BP device as well Patient to follow up with me in 6 weeks  Labs ordered today:  BMET in 2 weeks

## 2024-04-08 NOTE — Patient Instructions (Signed)
 Follow up appointment: Wednesday August 20 at 2:30 pm  Call [phone] to schedule a visit with Dr. Jeffrie or his PA/NP for September, October  Go to the lab in 2 weeks, week of July 14 to check kidney function  Take your BP meds as follows:  Cut the metoprolol  succinate in half and take 12.5 mg daily  Increase losartan  to 25 mg twice daily   Check your blood pressure at home daily (if able) and keep record of the readings.  Your blood pressure goal is < 130/80  To check your pressure at home you will need to:  1. Sit up in a chair, with feet flat on the floor and back supported. Do not cross your ankles or legs. 2. Rest your left arm so that the cuff is about heart level. If the cuff goes on your upper arm,  then just relax the arm on the table, arm of the chair or your lap. If you have a wrist cuff, we  suggest relaxing your wrist against your chest (think of it as Pledging the Flag with the  wrong arm).  3. Place the cuff snugly around your arm, about 1 inch above the crook of your elbow. The  cords should be inside the groove of your elbow.  4. Sit quietly, with the cuff in place, for about 5 minutes. After that 5 minutes press the power  button to start a reading. 5. Do not talk or move while the reading is taking place.  6. Record your readings on a sheet of paper. Although most cuffs have a memory, it is often  easier to see a pattern developing when the numbers are all in front of you.  7. You can repeat the reading after 1-3 minutes if it is recommended  Make sure your bladder is empty and you have not had caffeine or tobacco within the last 30 min  Always bring your blood pressure log with you to your appointments. If you have not brought your monitor in to be double checked for accuracy, please bring it to your next appointment.  You can find a list of quality blood pressure cuffs at [email or web address]  Important lifestyle changes to control high blood pressure   Intervention  Effect on the BP  Lose extra pounds and watch your waistline Weight loss is one of the most effective lifestyle changes for controlling blood pressure. If you're overweight or obese, losing even a small amount of weight can help reduce blood pressure. Blood pressure might go down by about 1 millimeter of mercury (mm Hg) with each kilogram (about 2.2 pounds) of weight lost.  Exercise regularly As a general goal, aim for at least 30 minutes of moderate physical activity every day. Regular physical activity can lower high blood pressure by about 5 to 8 mm Hg.  Eat a healthy diet Eating a diet rich in whole grains, fruits, vegetables, and low-fat dairy products and low in saturated fat and cholesterol. A healthy diet can lower high blood pressure by up to 11 mm Hg.  Reduce salt (sodium) in your diet Even a small reduction of sodium in the diet can improve heart health and reduce high blood pressure by about 5 to 6 mm Hg.  Limit alcohol One drink equals 12 ounces of beer, 5 ounces of wine, or 1.5 ounces of 80-proof liquor.  Limiting alcohol to less than one drink a day for women or two drinks a day for men can help lower blood pressure  by about 4 mm Hg.   If you have any questions or concerns please use My Chart to send questions or call the office at [phone]

## 2024-04-08 NOTE — Progress Notes (Signed)
 Office Visit    Patient Name: Nicholas [name] Date of Encounter: 04/08/2024  Primary Care Provider:  Leonel Cole, MD Primary Cardiologist:  Oneil Parchment, MD  Chief Complaint    Hypertension  Significant Past Medical History   CHF NYHA II, complicated by ventricular arrhythmias, has ICD, on amiodarone   CAD 2003 CABG x 3, NSTEMI   HLD 12/24 LDL 41 on rosuvastatin  40    Allergies  Allergen Reactions   Atorvastatin Calcium  Other (See Comments)    Nausea   Etomidate  Other (See Comments)    Intolerance    History of Present Illness    Nicholas [name] is a 68 y.o. male patient of Dr Cindie, in the office today for hypertension evaluation.   His wife accompanies him to the office today.  He saw Dr. Cindie in March, at which time his office pressure was 170/100. Patient notes today that he had eaten corned beef hash the day prior to that appointment, and has not eaten since.  Has not seen readings that high at home since March.    Blood Pressure Goal:  130/80  Current Medications:  losartan  25 mg every day, metoprolol  succ 25 mg every day,   Previously tried:  enalapril  (2015), hctz  Family Hx: mother had hypertension; mother DM, dialysis, cancer finallly; dad unknown; children 12, 22, 51, 53, 40, oldest on BP meds   Social Hx:      Tobacco: no  Alcohol:1 beer per day  Caffeine: decaf coffee daily  Diet:   mostly home cooked , occasional fried meat with canola; although mostly baked and grilled; veggies fresh (collards,cabbage), doesn't snack much   Exercise: walks at work, no regular exercise  Home BP readings: checks BP at work as well as home, these are work readings he recorded on his phone over the month of June:  135/89; 129/84; 122/85; 118/81; 128/79 HR 52-60  Adherence Assessment  Do you ever forget to take your medication? [] Yes [x] No  Do you ever skip doses due to side effects? [] Yes [x] No  Do you have trouble affording your medicines? [] Yes [x] No  Are you  ever unable to pick up your medication due to transportation difficulties? [] Yes [x] No    Accessory Clinical Findings    Lab Results  Component Value Date   CREATININE 1.38 (H) 12/26/2023   BUN 18 12/26/2023   NA 140 12/26/2023   K 4.5 12/26/2023   CL 106 12/26/2023   CO2 22 12/26/2023   Lab Results  Component Value Date   ALT 24 12/26/2023   AST 31 12/26/2023   ALKPHOS 70 12/26/2023   BILITOT <0.2 12/26/2023   Lab Results  Component Value Date   HGBA1C 6.4 (H) 06/10/2014    Home Medications    Current Outpatient Medications  Medication Sig Dispense Refill   alfuzosin  (UROXATRAL ) 10 MG 24 hr tablet Take 1 tablet (10 mg total) by mouth daily immediately after meal for 90 days. 90 tablet 2   amiodarone  (PACERONE ) 200 MG tablet Take 1 tablet (200 mg total) by mouth daily. 60 tablet 3   aspirin  EC 81 MG tablet Take 81 mg by mouth daily with breakfast.     docusate sodium (COLACE) 100 MG capsule Take 100 mg by mouth daily.     losartan  (COZAAR ) 25 MG tablet Take 1 tablet (25 mg total) by mouth at bedtime. 90 tablet 3   metoprolol  succinate (TOPROL  XL) 25 MG 24 hr tablet Take 1 tablet (25 mg total) by  mouth daily. 90 tablet 3   mexiletine (MEXITIL ) 150 MG capsule Take 1 capsule (150 mg total) by mouth every 8 (eight) hours. 270 capsule 3   nitroGLYCERIN  (NITROSTAT ) 0.4 MG SL tablet Place 1 tablet (0.4 mg total) under the tongue every 5 (five) minutes x 3 doses as needed for chest pain. 25 tablet 11   rosuvastatin  (CRESTOR ) 40 MG tablet Take 1 tablet (40 mg total) by mouth daily at 6 PM. 90 tablet 3   No current facility-administered medications for this visit.         Assessment & Plan    Benign hypertension Assessment: BP is controlled in office BP 108/66 mmHg. Home and work readings higher, average 126/84 Tolerates losartan  well, without any side effects Complains about increase in fatigue since starting medications Reiterated the importance of regular exercise and  low salt diet   Plan:  Decrease metoprolol  succ to 12.5 mg once daily Increase losartan  to 25 mg twice daily Patient to keep record of BP readings with heart rate and report to us  at the next visit - will bring home BP device as well Patient to follow up with me in 6 weeks  Labs ordered today:  BMET in 2 weeks   Allean Mink PharmD CPP [facility]  [address] 5th floor [place], [place] [address] [phone]

## 2024-04-17 DIAGNOSIS — N401 Enlarged prostate with lower urinary tract symptoms: Secondary | ICD-10-CM | POA: Diagnosis not present

## 2024-04-20 ENCOUNTER — Other Ambulatory Visit (HOSPITAL_COMMUNITY): Payer: Self-pay

## 2024-04-21 ENCOUNTER — Ambulatory Visit: Payer: Self-pay | Admitting: Pharmacist Clinician (PhC)/ Clinical Pharmacy Specialist

## 2024-04-21 LAB — BASIC METABOLIC PANEL WITH GFR
BUN/Creatinine Ratio: 10 (ref 10–24)
BUN: 15 mg/dL (ref 8–27)
CO2: 19 mmol/L — ABNORMAL LOW (ref 20–29)
Calcium: 8.9 mg/dL (ref 8.6–10.2)
Chloride: 105 mmol/L (ref 96–106)
Creatinine, Ser: 1.44 mg/dL — ABNORMAL HIGH (ref 0.76–1.27)
Glucose: 93 mg/dL (ref 70–99)
Potassium: 4.8 mmol/L (ref 3.5–5.2)
Sodium: 137 mmol/L (ref 134–144)
eGFR: 53 mL/min/1.73 — ABNORMAL LOW (ref >59)

## 2024-04-23 ENCOUNTER — Other Ambulatory Visit: Payer: Self-pay | Admitting: Cardiology

## 2024-04-23 ENCOUNTER — Other Ambulatory Visit (HOSPITAL_COMMUNITY): Payer: Self-pay

## 2024-04-23 DIAGNOSIS — R350 Frequency of micturition: Secondary | ICD-10-CM | POA: Diagnosis not present

## 2024-04-23 DIAGNOSIS — N401 Enlarged prostate with lower urinary tract symptoms: Secondary | ICD-10-CM | POA: Diagnosis not present

## 2024-04-23 DIAGNOSIS — R338 Other retention of urine: Secondary | ICD-10-CM | POA: Diagnosis not present

## 2024-04-23 MED ORDER — NITROGLYCERIN 0.4 MG SL SUBL
0.4000 mg | SUBLINGUAL_TABLET | SUBLINGUAL | 1 refills | Status: AC | PRN
  Filled 2024-04-23: qty 25, 15d supply, fill #0

## 2024-04-27 ENCOUNTER — Other Ambulatory Visit: Payer: Self-pay

## 2024-04-27 ENCOUNTER — Other Ambulatory Visit (HOSPITAL_COMMUNITY): Payer: Self-pay

## 2024-04-27 MED ORDER — ALFUZOSIN HCL ER 10 MG PO TB24
10.0000 mg | ORAL_TABLET | Freq: Every day | ORAL | 1 refills | Status: DC
  Filled 2024-04-27: qty 90, 90d supply, fill #0
  Filled 2024-08-04: qty 90, 90d supply, fill #1

## 2024-05-06 ENCOUNTER — Other Ambulatory Visit (HOSPITAL_COMMUNITY): Payer: Self-pay

## 2024-05-07 ENCOUNTER — Other Ambulatory Visit (HOSPITAL_COMMUNITY): Payer: Self-pay

## 2024-05-07 ENCOUNTER — Other Ambulatory Visit: Payer: Self-pay | Admitting: Adult Health

## 2024-05-07 DIAGNOSIS — N401 Enlarged prostate with lower urinary tract symptoms: Secondary | ICD-10-CM

## 2024-05-08 ENCOUNTER — Other Ambulatory Visit (HOSPITAL_COMMUNITY): Payer: Self-pay

## 2024-05-11 ENCOUNTER — Ambulatory Visit

## 2024-05-11 DIAGNOSIS — I5042 Chronic combined systolic (congestive) and diastolic (congestive) heart failure: Secondary | ICD-10-CM

## 2024-05-12 ENCOUNTER — Telehealth: Payer: Self-pay

## 2024-05-12 DIAGNOSIS — H5203 Hypermetropia, bilateral: Secondary | ICD-10-CM | POA: Diagnosis not present

## 2024-05-12 NOTE — Telephone Encounter (Signed)
 Alert received from [employer] for re-check optivol. Normal device function.  HF diagnostics remain abnormal.  Patient called and denies any recent weight gain, swelling in lower extremities, needing to sleep at elevated height or any other fluid retention symptoms.   Will forward to Dr. Cindie to review.

## 2024-05-19 NOTE — Progress Notes (Signed)
 Remote ICD transmission.

## 2024-05-22 ENCOUNTER — Other Ambulatory Visit (HOSPITAL_COMMUNITY): Payer: Self-pay

## 2024-05-25 ENCOUNTER — Ambulatory Visit
Admission: RE | Admit: 2024-05-25 | Discharge: 2024-05-25 | Disposition: A | Discharge status: Home / Self Care | Source: Ambulatory Visit | Attending: Adult Health | Admitting: Adult Health

## 2024-05-25 DIAGNOSIS — N401 Enlarged prostate with lower urinary tract symptoms: Secondary | ICD-10-CM | POA: Diagnosis not present

## 2024-05-25 HISTORY — PX: IR RADIOLOGIST EVAL & MGMT: IMG5224

## 2024-05-25 NOTE — Progress Notes (Signed)
 Reason for visit: BPH with LUTS. Prostate embolization at the request of [name],[name]   Care Team(s) Primary Care: Leonel Cole, MD Cardiology: Jeffrie Oneil BROCKS, MD  Urology: Carolee Beagle, MD  History of Present Illness:  [name] is a 68 y.o. male comorbid w PMHx significant for CAD s/p CABG x3, HTN, VT, CHF and BPH. Pt reports recent acute urinary obstruction episode requiring ER visit and foley catheterization (03/23/24), which he kept for a week. UA was positive for E. coli. He is joined in the visit by his spouse, Nicholas [name], who is a reliable historian and reports that he has been treated for BPH w alpha blockade x20yrs. He reports increasing urinary urgency, nocturia up to 10X/night and a reduced stream.  He is on medical management with alfuzosin  10 mg QD. He denies hematuria.   I used a International Prostatism Symptom Score* (IPSS) Score to quantify his symptoms : 23 points (Severe symptoms)   *Wadie MJ, Ashley GARNELL Mickey Valorie MP, et al. The American Urological Association symptom index for benign prostatic hyperplasia. The Measurement Committee of the American Urological Association. J Urol [id number]; 851:8450.   Review of Systems: A 12 point ROS discussed and pertinent positives are indicated in the HPI above.  All other systems are negative.   Past Medical History:  Diagnosis Date   Arthritis    knees, hands   Cataract    Coronary artery disease    Dysrhythmia 06/2014   sinus brady with PVC'S   GERD (gastroesophageal reflux disease)    Hepatitis C    Hx of adenomatous colonic polyps 09/21/2014   Hypercholesteremia    Hypertension    Myocardial infarction [facility]) 2024    Past Surgical History:  Procedure Laterality Date   CARDIAC CATHETERIZATION     CARDIAC SURGERY     triple bypass   COLONOSCOPY     CORONARY ARTERY BYPASS GRAFT  2000   CABG x 3 Vessels   ICD IMPLANT N/A 09/13/2023   Procedure: ICD IMPLANT;  Surgeon: Cindie Ole DASEN, MD;  Location: [facility]  INVASIVE CV LAB;  Service: Cardiovascular;  Laterality: N/A;   IR RADIOLOGIST EVAL & MGMT  05/25/2024   LEFT HEART CATH AND CORS/GRAFTS ANGIOGRAPHY N/A 06/19/2021   Procedure: LEFT HEART CATH AND CORS/GRAFTS ANGIOGRAPHY;  Surgeon: Claudene Victory ORN, MD;  Location: [facility] INVASIVE CV LAB;  Service: Cardiovascular;  Laterality: N/A;   LEFT HEART CATH AND CORS/GRAFTS ANGIOGRAPHY N/A 09/10/2023   Procedure: LEFT HEART CATH AND CORS/GRAFTS ANGIOGRAPHY;  Surgeon: Ladona Heinz, MD;  Location: [facility] INVASIVE CV LAB;  Service: Cardiovascular;  Laterality: N/A;    Allergies: Atorvastatin calcium  and Etomidate   Medications: Prior to Admission medications   Medication Sig Start Date End Date Taking? Authorizing Provider  alfuzosin  (UROXATRAL ) 10 MG 24 hr tablet Take 1 tablet (10 mg total) by mouth daily after the same meal every day. 04/27/24     amiodarone  (PACERONE ) 200 MG tablet Take 1 tablet (200 mg total) by mouth daily. 09/27/23   Lesia Ozell Barter, PA-C  amoxicillin -clavulanate (AUGMENTIN ) 875-125 MG tablet Take 1 tablet by mouth 2 (two) times daily. 04/08/24     aspirin  EC 81 MG tablet Take 81 mg by mouth daily with breakfast.    [provider]  docusate sodium (COLACE) 100 MG capsule Take 100 mg by mouth daily.    [provider]  losartan  (COZAAR ) 25 MG tablet Take 1 tablet (25 mg total) by mouth at bedtime. 01/21/24  Jeffrie Oneil BROCKS, MD  metoprolol  succinate (TOPROL  XL) 25 MG 24 hr tablet Take 1 tablet (25 mg total) by mouth daily. 10/14/23   Lesia Ozell Barter, PA-C  mexiletine (MEXITIL ) 150 MG capsule Take 1 capsule (150 mg total) by mouth every 8 (eight) hours. 01/23/24   Cindie Ole DASEN, MD  nitroGLYCERIN  (NITROSTAT ) 0.4 MG SL tablet Place 1 tablet (0.4 mg total) under the tongue every 5 (five) minutes for 3 doses as needed for chest pain. 04/23/24   Jeffrie Oneil BROCKS, MD  rosuvastatin  (CRESTOR ) 40 MG tablet Take 1 tablet (40 mg total) by mouth daily at 6 PM. 07/29/23   Jeffrie Oneil BROCKS, MD     Family History  Problem Relation Age of Onset   Diabetic kidney disease Mother    Kidney disease Mother    Throat cancer Mother    Cancer Mother    Diabetes Mother    Hypertension Mother    Esophageal cancer Mother    Other Father        unknown   Hypertension Father    Heart attack Neg Hx    Colon cancer Neg Hx    Stomach cancer Neg Hx    Rectal cancer Neg Hx    Colon polyps Neg Hx     Social History   Socioeconomic History   Marital status: Married    Spouse name: Not on file   Number of children: Not on file   Years of education: 11   Highest education level: 11th grade  Occupational History   Not on file  Tobacco Use   Smoking status: Former    Current packs/day: 0.00    Types: Cigarettes    Quit date: 06/10/2002    Years since quitting: 21.9   Smokeless tobacco: Never  Vaping Use   Vaping status: Never Used  Substance and Sexual Activity   Alcohol use: Yes    Alcohol/week: 7.0 standard drinks of alcohol    Types: 7 Cans of beer per week   Drug use: Yes    Frequency: 3.0 times per week    Types: Marijuana    Comment: occ   Sexual activity: Yes  Other Topics Concern   Not on file  Social History Narrative   Not on file   Social Drivers of Health   Financial Resource Strain: Not on file  Food Insecurity: No Food Insecurity (09/10/2023)   Hunger Vital Sign    Worried About Running Out of Food in the Last Year: Never true    Ran Out of Food in the Last Year: Never true  Transportation Needs: No Transportation Needs (09/10/2023)   PRAPARE - Administrator, Civil Service (Medical): No    Lack of Transportation (Non-Medical): No  Physical Activity: Not on file  Stress: Not on file  Social Connections: Not on file    Review of Systems As above  Vital Signs: BP 114/73 (BP Location: Left Arm, Patient Position: Sitting, Cuff Size: Normal)   Pulse (!) 54   Temp 97.8 F (36.6 C)   Resp 14   SpO2 97%   Physical  Exam   General: WN, NAD  CV: RRR on monitor Pulm: normal work of breathing on RA Abd: S, ND, NT MSK: Grossly normal Psych: Appropriate affect.  Imaging:    US  Prostate, 04/17/24 Prostatomegaly, measures approximately 5.9*4.8*5.2 cm (~77 mL)  IR Radiologist Eval & Mgmt Result Date: 05/25/2024 EXAM: NEW PATIENT OFFICE VISIT CHIEF COMPLAINT: See below HISTORY  OF PRESENT ILLNESS: See below REVIEW OF SYSTEMS: See below PHYSICAL EXAMINATION: See below ASSESSMENT AND PLAN: Please refer to completed note in the electronic medical record on [facility] Thom Hall, MD [facility] Electronically Signed   By: Thom Hall M.D.   On: 05/25/2024 09:41    Labs:  CBC: Recent Labs    09/10/23 0858 09/12/23 0325 09/13/23 0259 09/14/23 0301  WBC 8.5 7.9 7.2 7.3  HGB 14.0 13.3 12.8* 14.2  HCT 41.8 39.8 38.3* 40.8  PLT 161 127* 114* 78*    COAGS: No results for input(s): INR, APTT in the last 8760 hours.  BMP: Recent Labs    09/11/23 0225 09/12/23 0325 09/13/23 0259 09/14/23 0301 10/14/23 1336 12/26/23 1539 04/20/24 1441  NA 139 137 138 137 139 140 137  K 3.7 4.0 3.9 4.6 4.7 4.5 4.8  CL 109 107 107 108 106 106 105  CO2 23 22 23  20* 20 22 19*  GLUCOSE 107* 110* 99 91 84 87 93  BUN 16 13 9 13 20 18 15   CALCIUM  8.6* 9.0 9.1 8.9 8.9 9.3 8.9  CREATININE 1.41* 1.37* 1.24 1.22 1.49* 1.38* 1.44*  GFRNONAA 55* 57* >60 >60  --   --   --    UA:  Latest Reference Range & Units 03/23/24 10:12  Urinalysis, w/ Reflex to Culture (Infection Suspected)  Rpt !  Appearance CLEAR  HAZY !  Bilirubin Urine NEGATIVE  NEGATIVE  Color, Urine YELLOW  YELLOW  Glucose, UA NEGATIVE mg/dL NEGATIVE  Hgb urine dipstick NEGATIVE  MODERATE !  Ketones, ur NEGATIVE mg/dL NEGATIVE  Leukocytes,Ua NEGATIVE  LARGE !  Nitrite NEGATIVE  POSITIVE !  pH 5.0 - 8.0  5.0  Protein NEGATIVE mg/dL 30 !  Specific Gravity, Urine 1.005 - 1.030  1.013   Specimen Source  URINE, CATHETERIZED  Bacteria, UA NONE SEEN  FEW !  Hyaline Casts, UA  PRESENT  Mucus  PRESENT  RBC / HPF 0 - 5 RBC/hpf 0-5  Squamous Epithelial / HPF 0 - 5 /HPF 0-5  WBC, UA 0 - 5 WBC/hpf >50  !: Data is abnormal Rpt: View report in Results Review for more information   Assessment and Plan:  68 y/o M comorbid w PMHx significant for CAD s/p CABG x3, HTN, VT, CHF and long standing BPH. Nocturia x10 / night and recent catheterization for acute urinary retention.  Prostatomegaly (76 mL) PSA, last 5.0 (04/01/24) IPSS Score Total, 23 points (Severe symptoms) QoL unhappy, on alfuzosin  10 mg QD  Given the nature of the disease, I felt that the patient would benefit from Prostate Artery Embolization (PAE).  The procedure has been fully reviewed with the patient/patient's authorized representative. The risks, benefits and alternatives of PAE were fully discussed including technique, potential complications, timeline of improvement, efficacy and durability. The patient/patient's authorized representative has consented to the procedure.   *Will order CTA Pelvis for procedural planning *OK to proceed to schedule PAE on mutual availability.   *Same day procedure, no overnight admission. Procedure to be performed at [facility] *Pt habitus < 6 ft, will plan for trans-radial access. *Rocephin for pre op Abx *CBC, BMP, Coags + PSA to be drawn on procedure day  Thank you for this interesting consult.  I greatly enjoyed meeting Nicholas [name] and look forward to participating in their care.  A copy of this report was sent to the requesting provider on this date.  Electronically Signed:  Thom  Arjay Jaskiewicz, MD [facility]   Pager. [phone] Clinic. [phone]  I spent a total of 45 Minutes  in face to face in clinical consultation, greater than 50% of which was counseling/coordinating care for Mr Nicholas [name]' BPH and urinary  retention

## 2024-05-26 ENCOUNTER — Other Ambulatory Visit: Payer: Self-pay | Admitting: Interventional Radiology

## 2024-05-26 DIAGNOSIS — N401 Enlarged prostate with lower urinary tract symptoms: Secondary | ICD-10-CM

## 2024-05-27 ENCOUNTER — Encounter: Payer: Self-pay | Admitting: Pharmacist Clinician (PhC)/ Clinical Pharmacy Specialist

## 2024-05-27 ENCOUNTER — Ambulatory Visit: Attending: Cardiology | Admitting: Pharmacist Clinician (PhC)/ Clinical Pharmacy Specialist

## 2024-05-27 VITALS — BP 108/65 | HR 52

## 2024-05-27 DIAGNOSIS — I10 Essential (primary) hypertension: Secondary | ICD-10-CM

## 2024-05-27 NOTE — Assessment & Plan Note (Signed)
 Assessment: BP is controlled in office BP 108/65 mmHg; Home device reads within 10 points of office Tolerates losartan  25 mg daily, metoprolol  12.5 mg daily well, without any side effects Denies SOB, palpitation, chest pain, headaches,or swelling Reiterated the importance of regular exercise and low salt diet   Plan:  Continue taking losartan  25 mg every day, metoprolol  succ 12.5 mg every day Patient to keep record of BP readings with heart rate and report to us  should home readings trend to > 130/80  Patient to follow up with Dr. Jeffrie this fall  Labs ordered today:  none

## 2024-05-27 NOTE — Patient Instructions (Signed)
 Follow up appointment: with Dr. Jeffrie in September  Take your BP meds as follows: continue with losartan  25 mg once daily and metoprolol  12.5 mg (1/2 tablet) once daily.   Check your blood pressure at home 2-3 times per week and keep record of the readings.  Please reach out to us  if you see the readings trending to > 130 on a consistent basis  Your blood pressure goal is < 130/80  To check your pressure at home you will need to:  1. Sit up in a chair, with feet flat on the floor and back supported. Do not cross your ankles or legs. 2. Rest your left arm so that the cuff is about heart level. If the cuff goes on your upper arm,  then just relax the arm on the table, arm of the chair or your lap. If you have a wrist cuff, we  suggest relaxing your wrist against your chest (think of it as Pledging the Flag with the  wrong arm).  3. Place the cuff snugly around your arm, about 1 inch above the crook of your elbow. The  cords should be inside the groove of your elbow.  4. Sit quietly, with the cuff in place, for about 5 minutes. After that 5 minutes press the power  button to start a reading. 5. Do not talk or move while the reading is taking place.  6. Record your readings on a sheet of paper. Although most cuffs have a memory, it is often  easier to see a pattern developing when the numbers are all in front of you.  7. You can repeat the reading after 1-3 minutes if it is recommended  Make sure your bladder is empty and you have not had caffeine or tobacco within the last 30 min  Always bring your blood pressure log with you to your appointments. If you have not brought your monitor in to be double checked for accuracy, please bring it to your next appointment.  You can find a list of quality blood pressure cuffs at [email or web address]  Important lifestyle changes to control high blood pressure  Intervention  Effect on the BP  Lose extra pounds and watch your waistline Weight loss is one  of the most effective lifestyle changes for controlling blood pressure. If you're overweight or obese, losing even a small amount of weight can help reduce blood pressure. Blood pressure might go down by about 1 millimeter of mercury (mm Hg) with each kilogram (about 2.2 pounds) of weight lost.  Exercise regularly As a general goal, aim for at least 30 minutes of moderate physical activity every day. Regular physical activity can lower high blood pressure by about 5 to 8 mm Hg.  Eat a healthy diet Eating a diet rich in whole grains, fruits, vegetables, and low-fat dairy products and low in saturated fat and cholesterol. A healthy diet can lower high blood pressure by up to 11 mm Hg.  Reduce salt (sodium) in your diet Even a small reduction of sodium in the diet can improve heart health and reduce high blood pressure by about 5 to 6 mm Hg.  Limit alcohol One drink equals 12 ounces of beer, 5 ounces of wine, or 1.5 ounces of 80-proof liquor.  Limiting alcohol to less than one drink a day for women or two drinks a day for men can help lower blood pressure by about 4 mm Hg.   If you have any questions or concerns please use My Chart  to send questions or call the office at [phone]

## 2024-05-27 NOTE — Progress Notes (Signed)
 Office Visit    Patient Name: Nicholas [name] Date of Encounter: 05/27/2024  Primary Care Provider:  Leonel Cole, MD Primary Cardiologist:  Oneil Parchment, MD  Chief Complaint    Hypertension  Significant Past Medical History   CHF NYHA II, complicated by ventricular arrhythmias, has ICD, on amiodarone   CAD 2003 CABG x 3, NSTEMI   HLD 12/24 LDL 41 on rosuvastatin  40    Allergies  Allergen Reactions   Atorvastatin Calcium  Other (See Comments)    Nausea   Etomidate  Other (See Comments)    Intolerance    History of Present Illness    Nicholas [name] is a 68 y.o. male patient of Dr Cindie, in the office today for hypertension evaluation.   His wife accompanies him to the office today.  He saw Dr. Cindie in March, at which time his office pressure was 170/100. Patient notes today that he had eaten corned beef hash the day prior to that appointment, and has not eaten since.  Has not seen readings that high at home since March.  When I saw him in early July, office BP was 108/66 (HR 66).  He was complaining about fatigue that started after starting metoprolol .  Random BP readings he had with him were mostly WNL, HR did go to low 50's at times.  I cut the metoprolol  from 25 mg to 12.5 mg daily.  Today he returns for follow up.  Took losartan  for about a week bid, but caused to feel washed out, went back to every day   Blood Pressure Goal:  130/80  Current Medications:  losartan  25 mg every day, metoprolol  succ 12.5 mg every day,   Previously tried:  enalapril  (2015), hctz  Family Hx: mother had hypertension; mother DM, dialysis, cancer finallly; dad unknown; children 87, 62, 52, 24, 68, oldest on BP meds   Social Hx:      Tobacco: no  Alcohol:1 beer per day  Caffeine: decaf coffee daily  Diet:   mostly home cooked , occasional fried meat with canola; although mostly baked and grilled; veggies fresh (collards,cabbage), doesn't snack much   Exercise: walks at work, no regular  exercise  Home BP readings: brings home device, [employer] brand about 39-36 years old; reads within 10 points of office device.  Average of 21 home readings in past month 114/70.  (Range 100-131/62-82  Adherence Assessment  Do you ever forget to take your medication? [] Yes [x] No  Do you ever skip doses due to side effects? [] Yes [x] No  Do you have trouble affording your medicines? [] Yes [x] No  Are you ever unable to pick up your medication due to transportation difficulties? [] Yes [x] No    Accessory Clinical Findings    Lab Results  Component Value Date   CREATININE 1.44 (H) 04/20/2024   BUN 15 04/20/2024   NA 137 04/20/2024   K 4.8 04/20/2024   CL 105 04/20/2024   CO2 19 (L) 04/20/2024   Lab Results  Component Value Date   ALT 24 12/26/2023   AST 31 12/26/2023   ALKPHOS 70 12/26/2023   BILITOT <0.2 12/26/2023   Lab Results  Component Value Date   HGBA1C 6.4 (H) 06/10/2014    Home Medications    Current Outpatient Medications  Medication Sig Dispense Refill   alfuzosin  (UROXATRAL ) 10 MG 24 hr tablet Take 1 tablet (10 mg total) by mouth daily after the same meal every day. 90 tablet 1   amiodarone  (PACERONE ) 200 MG tablet Take 1  tablet (200 mg total) by mouth daily. 60 tablet 3   aspirin  EC 81 MG tablet Take 81 mg by mouth daily with breakfast.     docusate sodium (COLACE) 100 MG capsule Take 100 mg by mouth daily.     losartan  (COZAAR ) 25 MG tablet Take 1 tablet (25 mg total) by mouth at bedtime. 90 tablet 3   metoprolol  succinate (TOPROL -XL) 25 MG 24 hr tablet Take 12.5 mg by mouth daily.     mexiletine (MEXITIL ) 150 MG capsule Take 1 capsule (150 mg total) by mouth every 8 (eight) hours. 270 capsule 3   nitroGLYCERIN  (NITROSTAT ) 0.4 MG SL tablet Place 1 tablet (0.4 mg total) under the tongue every 5 (five) minutes for 3 doses as needed for chest pain. 25 tablet 1   rosuvastatin  (CRESTOR ) 40 MG tablet Take 1 tablet (40 mg total) by mouth daily at 6 PM. 90 tablet 3    No current facility-administered medications for this visit.         Assessment & Plan    Benign hypertension Assessment: BP is controlled in office BP 108/65 mmHg; Home device reads within 10 points of office Tolerates losartan  25 mg daily, metoprolol  12.5 mg daily well, without any side effects Denies SOB, palpitation, chest pain, headaches,or swelling Reiterated the importance of regular exercise and low salt diet   Plan:  Continue taking losartan  25 mg every day, metoprolol  succ 12.5 mg every day Patient to keep record of BP readings with heart rate and report to us  should home readings trend to > 130/80  Patient to follow up with Dr. Jeffrie this fall  Labs ordered today:  none   Allean Mink PharmD CPP [facility]  [address] floor [place], [place] [address] [phone]

## 2024-05-28 ENCOUNTER — Other Ambulatory Visit (HOSPITAL_COMMUNITY): Payer: Self-pay | Admitting: Interventional Radiology

## 2024-05-28 DIAGNOSIS — N401 Enlarged prostate with lower urinary tract symptoms: Secondary | ICD-10-CM

## 2024-06-03 ENCOUNTER — Ambulatory Visit
Admission: RE | Admit: 2024-06-03 | Discharge: 2024-06-03 | Disposition: A | Discharge status: Home / Self Care | Source: Ambulatory Visit | Attending: Interventional Radiology | Admitting: Interventional Radiology

## 2024-06-03 DIAGNOSIS — I700 Atherosclerosis of aorta: Secondary | ICD-10-CM | POA: Diagnosis not present

## 2024-06-03 DIAGNOSIS — N401 Enlarged prostate with lower urinary tract symptoms: Secondary | ICD-10-CM

## 2024-06-03 MED ORDER — IOPAMIDOL (ISOVUE-370) INJECTION 76%
100.0000 mL | Freq: Once | INTRAVENOUS | Status: AC | PRN
  Administered 2024-06-03: 100 mL via INTRAVENOUS

## 2024-06-16 ENCOUNTER — Ambulatory Visit: Payer: Commercial Managed Care - PPO

## 2024-06-16 DIAGNOSIS — I472 Ventricular tachycardia, unspecified: Secondary | ICD-10-CM | POA: Diagnosis not present

## 2024-06-17 LAB — CUP PACEART REMOTE DEVICE CHECK
Battery Remaining Longevity: 158 mo
Battery Voltage: 3.05 V
Brady Statistic RV Percent Paced: 0.09 %
Date Time Interrogation Session: 20250908224321
HighPow Impedance: 51 Ohm
Implantable Lead Connection Status: 753985
Implantable Lead Implant Date: 20241206
Implantable Lead Location: 753860
Implantable Pulse Generator Implant Date: 20241206
Lead Channel Impedance Value: 304 Ohm
Lead Channel Impedance Value: 380 Ohm
Lead Channel Pacing Threshold Amplitude: 0.75 V
Lead Channel Pacing Threshold Pulse Width: 0.4 ms
Lead Channel Sensing Intrinsic Amplitude: 5.9 mV
Lead Channel Setting Pacing Amplitude: 2 V
Lead Channel Setting Pacing Pulse Width: 0.4 ms
Lead Channel Setting Sensing Sensitivity: 0.3 mV

## 2024-06-19 ENCOUNTER — Ambulatory Visit: Payer: Self-pay | Admitting: Cardiology

## 2024-06-19 ENCOUNTER — Other Ambulatory Visit: Payer: Self-pay | Admitting: Radiology

## 2024-06-19 DIAGNOSIS — N138 Other obstructive and reflux uropathy: Secondary | ICD-10-CM

## 2024-06-19 NOTE — H&P (Signed)
 Chief Complaint: Benign prostatic hyperplasia with lower urinary tract symptoms; referred for bilateral prostate artery embolization  Referring Provider(s): Bell,E  Supervising Physician: Hughes Simmonds  Patient Status: [facility] - Out-pt  History of Present Illness: Nicholas [name] is a 68 y.o. male with past medical history significant for arthritis, coronary artery disease with prior MI/CABG, VT, AICD, CHF, GERD, hepatitis C, colon polyps, hyperlipidemia, hypertension and BPH with lower urinary tract symptoms.  He underwent consultation with Dr.  Hughes on 05/25/2024 to discuss treatment options for his BPH/LUTS and was deemed an appropriate candidate for bilateral prostate artery embolization.  He presents today for the procedure.   Patient is Full Code  Past Medical History:  Diagnosis Date   Arthritis    knees, hands   Cataract    Coronary artery disease    Dysrhythmia 06/2014   sinus brady with PVC'S   GERD (gastroesophageal reflux disease)    Hepatitis C    Hx of adenomatous colonic polyps 09/21/2014   Hypercholesteremia    Hypertension    Myocardial infarction [facility]) 2024    Past Surgical History:  Procedure Laterality Date   CARDIAC CATHETERIZATION     CARDIAC SURGERY     triple bypass   COLONOSCOPY     CORONARY ARTERY BYPASS GRAFT  2000   CABG x 3 Vessels   ICD IMPLANT N/A 09/13/2023   Procedure: ICD IMPLANT;  Surgeon: Cindie Ole DASEN, MD;  Location: [facility] INVASIVE CV LAB;  Service: Cardiovascular;  Laterality: N/A;   IR RADIOLOGIST EVAL & MGMT  05/25/2024   LEFT HEART CATH AND CORS/GRAFTS ANGIOGRAPHY N/A 06/19/2021   Procedure: LEFT HEART CATH AND CORS/GRAFTS ANGIOGRAPHY;  Surgeon: Claudene Victory ORN, MD;  Location: [facility] INVASIVE CV LAB;  Service: Cardiovascular;  Laterality: N/A;   LEFT HEART CATH AND CORS/GRAFTS ANGIOGRAPHY N/A 09/10/2023   Procedure: LEFT HEART CATH AND CORS/GRAFTS ANGIOGRAPHY;  Surgeon: Ladona Heinz, MD;  Location: [facility] INVASIVE CV LAB;  Service:  Cardiovascular;  Laterality: N/A;    Allergies: Atorvastatin calcium  and Etomidate   Medications: Prior to Admission medications   Medication Sig Start Date End Date Taking? Authorizing Provider  alfuzosin  (UROXATRAL ) 10 MG 24 hr tablet Take 1 tablet (10 mg total) by mouth daily after the same meal every day. 04/27/24     amiodarone  (PACERONE ) 200 MG tablet Take 1 tablet (200 mg total) by mouth daily. 09/27/23   Lesia Ozell Barter, PA-C  aspirin  EC 81 MG tablet Take 81 mg by mouth daily with breakfast.    [provider]  docusate sodium  (COLACE) 100 MG capsule Take 100 mg by mouth daily.    [provider]  losartan  (COZAAR ) 25 MG tablet Take 1 tablet (25 mg total) by mouth at bedtime. 01/21/24   Jeffrie Oneil BROCKS, MD  metoprolol  succinate (TOPROL -XL) 25 MG 24 hr tablet Take 12.5 mg by mouth daily.    [provider]  mexiletine (MEXITIL ) 150 MG capsule Take 1 capsule (150 mg total) by mouth every 8 (eight) hours. 01/23/24   Cindie Ole DASEN, MD  nitroGLYCERIN  (NITROSTAT ) 0.4 MG SL tablet Place 1 tablet (0.4 mg total) under the tongue every 5 (five) minutes for 3 doses as needed for chest pain. 04/23/24   Jeffrie Oneil BROCKS, MD  rosuvastatin  (CRESTOR ) 40 MG tablet Take 1 tablet (40 mg total) by mouth daily at 6 PM. 07/29/23   Jeffrie Oneil BROCKS, MD     Family History  Problem Relation Age of Onset   Diabetic  kidney disease Mother    Kidney disease Mother    Throat cancer Mother    Cancer Mother    Diabetes Mother    Hypertension Mother    Esophageal cancer Mother    Other Father        unknown   Hypertension Father    Heart attack Neg Hx    Colon cancer Neg Hx    Stomach cancer Neg Hx    Rectal cancer Neg Hx    Colon polyps Neg Hx     Social History   Socioeconomic History   Marital status: Married    Spouse name: Not on file   Number of children: Not on file   Years of education: 11   Highest education level: 11th grade  Occupational History   Not  on file  Tobacco Use   Smoking status: Former    Current packs/day: 0.00    Types: Cigarettes    Quit date: 06/10/2002    Years since quitting: 22.0   Smokeless tobacco: Never  Vaping Use   Vaping status: Never Used  Substance and Sexual Activity   Alcohol use: Yes    Alcohol/week: 7.0 standard drinks of alcohol    Types: 7 Cans of beer per week   Drug use: Yes    Frequency: 3.0 times per week    Types: Marijuana    Comment: occ   Sexual activity: Yes  Other Topics Concern   Not on file  Social History Narrative   Not on file   Social Drivers of Health   Financial Resource Strain: Not on file  Food Insecurity: No Food Insecurity (09/10/2023)   Hunger Vital Sign    Worried About Running Out of Food in the Last Year: Never true    Ran Out of Food in the Last Year: Never true  Transportation Needs: No Transportation Needs (09/10/2023)   PRAPARE - Administrator, Civil Service (Medical): No    Lack of Transportation (Non-Medical): No  Physical Activity: Not on file  Stress: Not on file  Social Connections: Not on file       Review of Systems currently denies fever, headache, chest pain, dyspnea, cough, abdominal/back pain, nausea, vomiting or bleeding  Vital Signs: Vitals:   06/22/24 1153  BP: 137/88  Pulse: (!) 57  Resp: 16  Temp: 98.4 F (36.9 C)  SpO2: 99%      Advance Care Plan: No documents on file  Physical Exam: Awake, alert.  Chest clear to auscultation bilaterally.  Heart with slightly bradycardic but regular rhythm.  Left chest wall AICD in place.  Abdomen soft, positive bowel sounds, nontender.  No lower extremity edema.  Imaging: CUP PACEART REMOTE DEVICE CHECK Result Date: 06/17/2024 PPM Scheduled remote reviewed. Normal device function.  Presenting rhythm: VS. Next remote 91 days. [facility], [facility]  CT ANGIO PELVIS W OR WO CONTRAST Result Date: 06/04/2024 CLINICAL DATA:  BPH, preplanning for PAE Sx EXAM: CT ANGIOGRAPHY PELVIS WITH CONTRAST  TECHNIQUE: Multidetector CT imaging of the pelvis was performed using the standard protocol following the bolus administration of intravenous contrast. Multiplanar image (3D post-processing) reconstructions and MIPs were obtained to evaluate the pelvic vascular anatomy. RADIATION DOSE REDUCTION: This exam was performed according to the departmental dose-optimization program which includes automated exposure control, adjustment of the mA and/or kV according to patient size and/or use of iterative reconstruction technique. CONTRAST:  ISOVUE -370 IOPAMIDOL  (ISOVUE -370) INJECTION 76% COMPARISON:  Ultrasound prostate, 04/17/2024. FINDINGS: VASCULAR; Challenging arterial  vascular evaluation secondary to delayed phase (late arterial/early venous). Pelvis: *Relatively acute aortic bifurcation, with severe burden of calcified and noncalcified bifurcation atherosclerosis. *Proximal occlusion of the RIGHT internal iliac artery, with distal reconstitution likely from collaterals. *Diseased but patent LEFT iliac artery bifurcation, with mild-to-moderate ostial stenosis at the proximal internal iliac artery. *Diseased but patent common femoral arteries with an aberrant RIGHT obturator artery, arising from the inferior epigastric artery. Potential prostatic collateral, however challenging to evaluate given delayed phase. *Internal iliac artery branching with division into the superior gluteal artery and gluteal pudendal trunk bilaterally (Yamaki group A). *Cannot evaluate the prostatic arteries, however suspected to arise from the anterior division of the internal iliac artery/gluteal pudendal trunk (type II) on the LEFT. NONVASCULAR; Urinary Tract: No distal ureteral or urinary bladder distention. Hypodense cystic-like structure RIGHT posterior bladder, likely a bladder diverticulum. Bowel: Imaged portions of distal small bowel, colon and rectum are nondilated. Lymphatic: No enlarged abdominal or pelvic lymph nodes.  Reproductive: Prostatomegaly, measuring approximately 4.9 x 5.3 x 5.7 cm, with a calculated volume of 74 mL Other:  No pelvic ascites. Musculoskeletal: No suspicious bone lesion. IMPRESSION: Suboptimal evaluation, secondary to delayed phase (late arterial/early venous). Within these constraints; 1. Prostatomegaly, measuring approximately 74 g in volume. 2. Acute angle at aortic bifurcation and severe burden of bifurcation arthrosclerosis. Proximal occlusion of the RIGHT hypogastric artery, with mild-to-moderate ostial stenosis at the LEFT hypogastric artery. Aortic Atherosclerosis (ICD10-I70.0). 3. 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 4. LEFT internal iliac artery branching into the superior gluteal artery and gluteal pudendal trunk (Yamaki group A), with nonvisualization of the prostatic artery does suspected from the gluteal pudendal trunk (type II). 5.  Additional incidental, chronic and senescent findings as above. Thom Hall, MD [facility] Radiology Electronically Signed   By: Thom Hall M.D.   On: 06/04/2024 16:33   IR Radiologist Eval & Mgmt Result Date: 05/25/2024 EXAM: NEW PATIENT OFFICE VISIT CHIEF COMPLAINT: See below HISTORY OF PRESENT ILLNESS: See below REVIEW OF SYSTEMS: See below PHYSICAL EXAMINATION: See below ASSESSMENT AND PLAN: Please refer to completed note in the electronic medical record on [facility] Thom Hall, MD [facility] Radiology Electronically Signed   By: Thom Hall M.D.   On: 05/25/2024 09:41    Labs:  CBC: Recent Labs    09/10/23 0858 09/12/23 0325 09/13/23 0259 09/14/23 0301  WBC 8.5 7.9 7.2 7.3  HGB 14.0 13.3 12.8* 14.2  HCT 41.8 39.8 38.3* 40.8  PLT 161 127* 114* 78*    COAGS: No results for  input(s): INR, APTT in the last 8760 hours.  BMP: Recent Labs    09/11/23 0225 09/12/23 0325 09/13/23 0259 09/14/23 0301 10/14/23 1336 12/26/23 1539 04/20/24 1441  NA 139 137 138 137 139 140 137  K 3.7 4.0 3.9 4.6 4.7 4.5 4.8  CL 109 107 107 108 106 106 105  CO2 23 22 23  20* 20 22 19*  GLUCOSE 107* 110* 99 91 84 87 93  BUN 16 13 9 13 20 18 15   CALCIUM  8.6* 9.0 9.1 8.9 8.9 9.3 8.9  CREATININE 1.41* 1.37* 1.24 1.22 1.49* 1.38* 1.44*  GFRNONAA 55* 57* >60 >60  --   --   --     LIVER FUNCTION TESTS: Recent Labs    09/10/23 0858 10/14/23 1336 12/26/23 1539  BILITOT 0.7 <0.2 <  0.2  AST 28 21 31   ALT 14 18 24   ALKPHOS 39 67 70  PROT 6.9 6.9 7.1  ALBUMIN 3.5 4.2 4.3    TUMOR MARKERS: No results for input(s): AFPTM, CEA, CA199, CHROMGRNA in the last 8760 hours.  Assessment and Plan: 68 y.o. male with past medical history significant for arthritis, coronary artery disease with prior MI/CABG, VT, AICD, CHF, GERD, hepatitis C, colon polyps, hyperlipidemia, hypertension and BPH with lower urinary tract symptoms.  He underwent consultation with Dr.  Hughes on 05/25/2024 to discuss treatment options for his BPH/LUTS and was deemed an appropriate candidate for bilateral prostate artery embolization.  He presents today for the procedure.Risks and benefits of procedure were discussed with the patient including, but not limited to bleeding, infection, vascular injury or contrast induced renal failure.  This interventional procedure involves the use of X-rays and because of the nature of the planned procedure, it is possible that we will have prolonged use of X-ray fluoroscopy.  Potential radiation risks to you include (but are not limited to) the following: - A slightly elevated risk for cancer  several years later in life. This risk is typically less than 0.5% percent. This risk is low in comparison to the normal incidence of human cancer, which is 33% for women and 50% for  men according to the American Cancer Society. - Radiation induced injury can include skin redness, resembling a rash, tissue breakdown / ulcers and hair loss (which can be temporary or permanent).   The likelihood of either of these occurring depends on the difficulty of the procedure and whether you are sensitive to radiation due to previous procedures, disease, or genetic conditions.   IF your procedure requires a prolonged use of radiation, you will be notified and given written instructions for further action.  It is your responsibility to monitor the irradiated area for the 2 weeks following the procedure and to notify your physician if you are concerned that you have suffered a radiation induced injury.    All of the patient's questions were answered, patient is agreeable to proceed.  Consent signed and in chart.   LABS PENDING   Thank you for allowing our service to participate in Nicholas [name] 's care.  Electronically Signed: D. Franky Rakers, PA-C   06/19/2024, 5:14 PM      I spent a total of  25 minutes   in face to face in clinical consultation, greater than 50% of which was counseling/coordinating care for pelvic arteriogram with bilateral prostate artery embolization

## 2024-06-22 ENCOUNTER — Other Ambulatory Visit (HOSPITAL_COMMUNITY): Payer: Self-pay

## 2024-06-22 ENCOUNTER — Other Ambulatory Visit: Payer: Self-pay | Admitting: Radiology

## 2024-06-22 ENCOUNTER — Ambulatory Visit (HOSPITAL_COMMUNITY)
Admission: RE | Admit: 2024-06-22 | Discharge: 2024-06-22 | Disposition: A | Discharge status: Home / Self Care | Source: Ambulatory Visit | Attending: Interventional Radiology | Admitting: Interventional Radiology

## 2024-06-22 ENCOUNTER — Other Ambulatory Visit (HOSPITAL_COMMUNITY): Payer: Self-pay | Admitting: Interventional Radiology

## 2024-06-22 ENCOUNTER — Ambulatory Visit (HOSPITAL_COMMUNITY)
Admission: RE | Admit: 2024-06-22 | Discharge: 2024-06-22 | Disposition: A | Discharge status: Home / Self Care | Source: Ambulatory Visit | Attending: Interventional Radiology

## 2024-06-22 ENCOUNTER — Encounter (HOSPITAL_COMMUNITY): Payer: Self-pay

## 2024-06-22 ENCOUNTER — Other Ambulatory Visit: Payer: Self-pay

## 2024-06-22 DIAGNOSIS — I110 Hypertensive heart disease with heart failure: Secondary | ICD-10-CM | POA: Diagnosis not present

## 2024-06-22 DIAGNOSIS — I708 Atherosclerosis of other arteries: Secondary | ICD-10-CM | POA: Diagnosis not present

## 2024-06-22 DIAGNOSIS — E785 Hyperlipidemia, unspecified: Secondary | ICD-10-CM | POA: Insufficient documentation

## 2024-06-22 DIAGNOSIS — Z951 Presence of aortocoronary bypass graft: Secondary | ICD-10-CM | POA: Diagnosis not present

## 2024-06-22 DIAGNOSIS — Z79899 Other long term (current) drug therapy: Secondary | ICD-10-CM | POA: Insufficient documentation

## 2024-06-22 DIAGNOSIS — Z87891 Personal history of nicotine dependence: Secondary | ICD-10-CM | POA: Diagnosis not present

## 2024-06-22 DIAGNOSIS — N138 Other obstructive and reflux uropathy: Secondary | ICD-10-CM

## 2024-06-22 DIAGNOSIS — K219 Gastro-esophageal reflux disease without esophagitis: Secondary | ICD-10-CM | POA: Insufficient documentation

## 2024-06-22 DIAGNOSIS — I252 Old myocardial infarction: Secondary | ICD-10-CM | POA: Insufficient documentation

## 2024-06-22 DIAGNOSIS — I509 Heart failure, unspecified: Secondary | ICD-10-CM | POA: Insufficient documentation

## 2024-06-22 DIAGNOSIS — Z95810 Presence of automatic (implantable) cardiac defibrillator: Secondary | ICD-10-CM | POA: Insufficient documentation

## 2024-06-22 DIAGNOSIS — I2510 Atherosclerotic heart disease of native coronary artery without angina pectoris: Secondary | ICD-10-CM | POA: Diagnosis not present

## 2024-06-22 DIAGNOSIS — N401 Enlarged prostate with lower urinary tract symptoms: Secondary | ICD-10-CM | POA: Insufficient documentation

## 2024-06-22 HISTORY — PX: IR ANGIOGRAM SELECTIVE EACH ADDITIONAL VESSEL: IMG667

## 2024-06-22 HISTORY — PX: IR US GUIDE VASC ACCESS LEFT: IMG2389

## 2024-06-22 HISTORY — PX: IR EMBO TUMOR ORGAN ISCHEMIA INFARCT INC GUIDE ROADMAPPING: IMG5449

## 2024-06-22 HISTORY — PX: IR 3D INDEPENDENT WKST: IMG2385

## 2024-06-22 HISTORY — PX: IR US GUIDE VASC ACCESS RIGHT: IMG2390

## 2024-06-22 HISTORY — PX: IR ANGIOGRAM PELVIS SELECTIVE OR SUPRASELECTIVE: IMG661

## 2024-06-22 LAB — BASIC METABOLIC PANEL WITH GFR
Anion gap: 11 (ref 5–15)
BUN: 17 mg/dL (ref 8–23)
CO2: 21 mmol/L — ABNORMAL LOW (ref 22–32)
Calcium: 9.2 mg/dL (ref 8.9–10.3)
Chloride: 109 mmol/L (ref 98–111)
Creatinine, Ser: 1.28 mg/dL — ABNORMAL HIGH (ref 0.61–1.24)
GFR, Estimated: >60 mL/min (ref >60)
Glucose, Bld: 119 mg/dL — ABNORMAL HIGH (ref 70–99)
Potassium: 4.6 mmol/L (ref 3.5–5.1)
Sodium: 140 mmol/L (ref 135–145)

## 2024-06-22 LAB — CBC WITH DIFFERENTIAL/PLATELET
Abs Immature Granulocytes: 0.02 K/uL (ref 0.00–0.07)
Basophils Absolute: 0 K/uL (ref 0.0–0.1)
Basophils Relative: 0 %
Eosinophils Absolute: 0.1 K/uL (ref 0.0–0.5)
Eosinophils Relative: 1 %
HCT: 40.7 % (ref 39.0–52.0)
Hemoglobin: 12.8 g/dL — ABNORMAL LOW (ref 13.0–17.0)
Immature Granulocytes: 0 %
Lymphocytes Relative: 22 %
Lymphs Abs: 1.9 K/uL (ref 0.7–4.0)
MCH: 25.9 pg — ABNORMAL LOW (ref 26.0–34.0)
MCHC: 31.4 g/dL (ref 30.0–36.0)
MCV: 82.4 fL (ref 80.0–100.0)
Monocytes Absolute: 1.2 K/uL — ABNORMAL HIGH (ref 0.1–1.0)
Monocytes Relative: 13 %
Neutro Abs: 5.5 K/uL (ref 1.7–7.7)
Neutrophils Relative %: 64 %
Platelets: 188 K/uL (ref 150–400)
RBC: 4.94 MIL/uL (ref 4.22–5.81)
RDW: 16.5 % — ABNORMAL HIGH (ref 11.5–15.5)
Smear Review: NORMAL
WBC: 8.7 K/uL (ref 4.0–10.5)
nRBC: 0 % (ref 0.0–0.2)

## 2024-06-22 LAB — PROTIME-INR
INR: 1.2 (ref 0.8–1.2)
Prothrombin Time: 15.9 s — ABNORMAL HIGH (ref 11.4–15.2)

## 2024-06-22 MED ORDER — DIPHENHYDRAMINE HCL 50 MG/ML IJ SOLN
INTRAMUSCULAR | Status: AC | PRN
  Administered 2024-06-22: 50 mg via INTRAVENOUS

## 2024-06-22 MED ORDER — MIDAZOLAM HCL 2 MG/2ML IJ SOLN
INTRAMUSCULAR | Status: AC | PRN
  Administered 2024-06-22: 1 mg via INTRAVENOUS

## 2024-06-22 MED ORDER — KETOROLAC TROMETHAMINE 30 MG/ML IJ SOLN
15.0000 mg | Freq: Once | INTRAMUSCULAR | Status: AC
  Administered 2024-06-22: 15 mg via INTRAVENOUS
  Filled 2024-06-22: qty 1

## 2024-06-22 MED ORDER — FENTANYL CITRATE (PF) 100 MCG/2ML IJ SOLN
INTRAMUSCULAR | Status: AC
  Filled 2024-06-22: qty 2

## 2024-06-22 MED ORDER — CHLORHEXIDINE GLUCONATE CLOTH 2 % EX PADS: 6.0000 | MEDICATED_PAD | Freq: Every day | CUTANEOUS | Status: DC

## 2024-06-22 MED ORDER — MIDAZOLAM HCL 2 MG/2ML IJ SOLN
INTRAMUSCULAR | Status: AC
  Filled 2024-06-22: qty 2

## 2024-06-22 MED ORDER — OXYCODONE-ACETAMINOPHEN 5-325 MG PO TABS
1.0000 | ORAL_TABLET | Freq: Four times a day (QID) | ORAL | 0 refills | Status: DC | PRN
  Filled 2024-06-22: qty 20, 5d supply, fill #0

## 2024-06-22 MED ORDER — NAPROXEN 500 MG PO TABS
500.0000 mg | ORAL_TABLET | Freq: Two times a day (BID) | ORAL | 0 refills | Status: AC
  Filled 2024-06-22: qty 14, 7d supply, fill #0

## 2024-06-22 MED ORDER — FENTANYL CITRATE (PF) 100 MCG/2ML IJ SOLN
INTRAMUSCULAR | Status: AC | PRN
  Administered 2024-06-22: 50 ug via INTRAVENOUS

## 2024-06-22 MED ORDER — NITROGLYCERIN IN D5W 100-5 MCG/ML-% IV SOLN
INTRAVENOUS | Status: AC
  Filled 2024-06-22: qty 250

## 2024-06-22 MED ORDER — HEPARIN SODIUM (PORCINE) 1000 UNIT/ML IJ SOLN
INTRAMUSCULAR | Status: AC
  Filled 2024-06-22: qty 10

## 2024-06-22 MED ORDER — SODIUM CHLORIDE 0.9 % IV SOLN: INTRAVENOUS | Status: DC

## 2024-06-22 MED ORDER — IOHEXOL 300 MG/ML  SOLN
250.0000 mL | Freq: Once | INTRAMUSCULAR | Status: AC | PRN
  Administered 2024-06-22: 205 mL via INTRA_ARTERIAL

## 2024-06-22 MED ORDER — CIPROFLOXACIN HCL 500 MG PO TABS
500.0000 mg | ORAL_TABLET | Freq: Two times a day (BID) | ORAL | 0 refills | Status: DC
  Filled 2024-06-22: qty 14, 7d supply, fill #0

## 2024-06-22 MED ORDER — SODIUM CHLORIDE 0.9 % IV SOLN
2.0000 g | Freq: Once | INTRAVENOUS | Status: AC
  Administered 2024-06-22: 2 g via INTRAVENOUS
  Filled 2024-06-22: qty 20

## 2024-06-22 MED ORDER — DEXAMETHASONE SODIUM PHOSPHATE 10 MG/ML IJ SOLN
8.0000 mg | Freq: Once | INTRAMUSCULAR | Status: AC
  Administered 2024-06-22: 8 mg via INTRAVENOUS
  Filled 2024-06-22: qty 1

## 2024-06-22 MED ORDER — OXYCODONE HCL 5 MG PO TABS
10.0000 mg | ORAL_TABLET | ORAL | Status: DC | PRN
  Administered 2024-06-22: 10 mg via ORAL
  Filled 2024-06-22: qty 2

## 2024-06-22 MED ORDER — SOLIFENACIN SUCCINATE 5 MG PO TABS
5.0000 mg | ORAL_TABLET | Freq: Every day | ORAL | 0 refills | Status: DC
  Filled 2024-06-22: qty 7, 7d supply, fill #0

## 2024-06-22 MED ORDER — FENTANYL CITRATE (PF) 100 MCG/2ML IJ SOLN
50.0000 ug | Freq: Once | INTRAMUSCULAR | Status: AC
  Administered 2024-06-22: 50 ug via INTRAVENOUS

## 2024-06-22 MED ORDER — DIPHENHYDRAMINE HCL 50 MG/ML IJ SOLN
INTRAMUSCULAR | Status: AC
  Filled 2024-06-22: qty 1

## 2024-06-22 MED ORDER — DOCUSATE SODIUM 100 MG PO CAPS
100.0000 mg | ORAL_CAPSULE | Freq: Two times a day (BID) | ORAL | 0 refills | Status: AC
  Filled 2024-06-22: qty 10, 5d supply, fill #0

## 2024-06-22 MED ORDER — LIDOCAINE HCL (PF) 1 % IJ SOLN
10.0000 mL | Freq: Once | INTRAMUSCULAR | Status: AC
  Administered 2024-06-22: 10 mL via INTRADERMAL

## 2024-06-22 MED ORDER — PHENAZOPYRIDINE HCL 100 MG PO TABS
100.0000 mg | ORAL_TABLET | Freq: Three times a day (TID) | ORAL | 0 refills | Status: DC | PRN
  Filled 2024-06-22: qty 21, 7d supply, fill #0

## 2024-06-22 MED ORDER — VERAPAMIL HCL 2.5 MG/ML IV SOLN
INTRAVENOUS | Status: AC
  Filled 2024-06-22: qty 2

## 2024-06-22 MED ORDER — LIDOCAINE HCL (PF) 1 % IJ SOLN
INTRAMUSCULAR | Status: AC
  Filled 2024-06-22: qty 30

## 2024-06-22 MED ORDER — METHYLPREDNISOLONE 4 MG PO TBPK
ORAL_TABLET | ORAL | 0 refills | Status: DC
  Filled 2024-06-22: qty 21, 6d supply, fill #0

## 2024-06-22 NOTE — Sedation Documentation (Signed)
 RN Briggs Edelen pulled 4 mg Versed  and 200 mcg Fentanyl  in Ir room. Pt. Received 4 mg Versed  and 200 mcg Fentanyl  throughout the procedure.

## 2024-06-22 NOTE — Procedures (Signed)
 Vascular and Interventional Radiology Procedure Note  Patient: Nicholas [name] DOB: Mar 12, 1956 Medical Record Number: [id number] Note Date/Time: 06/22/24 2:12 PM   Performing Physician: Thom Hall, MD Assistant(s): None  Diagnosis: BPH w LUTS   Procedure(s):  PELVIC ARTERIOGRAPHY PROSTATE ARTERY EMBOLIZATION   Anesthesia: Conscious Sedation Complications: None Estimated Blood Loss: Minimal Specimens: None  Findings:  - access via the RIGHT femoral artery. - aberrant origin of the R obturator artery without prostatic supply. Severely stenosed / occluded R hypogastric artery.  - dominant supply from L, with cross-prostatic collateralization. - Successful L prostatic embolization with 100-300 um microparticles to near stasis, with additional coil embolization of the prostatic artery origin. - AngioSeal closure at the R groin with distal RLE pulses at the end of the case.  Plan: - Post sheath removal precautions.  - Bedrest with RLE straight x2hrs.  Final report to follow once all images are reviewed and compared with previous studies.  See detailed dictation with images in PACS. The patient tolerated the procedure well without incident or complication and was returned to Recovery in stable condition.    Thom Hall, MD [facility] Radiology   Pager. [phone] Clinic. [phone]

## 2024-06-23 ENCOUNTER — Telehealth (HOSPITAL_COMMUNITY): Payer: Self-pay | Admitting: Interventional Radiology

## 2024-06-23 NOTE — Progress Notes (Signed)
 Vascular and Interventional Radiology  Phone Note  Patient: Nicholas [name] DOB: Jul 15, 1956 Medical Record Number: [id number] Note Date/Time: 06/23/24 2:29 PM   Diagnosis: BPH w LUTS. Post PAE   I identified myself to the patient and conveyed my credentials to Mr.Nicholas [name]. He was joined in the call by his spouse, Nicholas [name]. For medical emergencies, Pt was advised to call 911 or go to the nearest emergency room.   Assessment  Plan: 68 y.o. year old male POD 1 s/p L PAE. VIR reached out in courtesy follow-up.  Pt reports that they are doing well. Mild dysuria, pain is well controlled with Rxs.  No concern at this time.    Follow up Pt to follow up with me in Clinic within 1 month post op.   As part of this Telephone encounter, no in-person exam was conducted.  The patient was physically located in [place]  or a state in which I am permitted to provide care. The encounter was reasonable and appropriate under the circumstances given the patient's presentation at the time.   Thom Hall, MD Vascular and Interventional Radiology Specialists [facility] Radiology   Pager. [phone] Clinic. [phone]

## 2024-06-25 NOTE — Progress Notes (Signed)
Remote ICD Transmission.

## 2024-07-06 ENCOUNTER — Ambulatory Visit: Attending: Student | Admitting: Student

## 2024-07-06 ENCOUNTER — Other Ambulatory Visit (HOSPITAL_COMMUNITY): Payer: Self-pay

## 2024-07-06 ENCOUNTER — Encounter: Payer: Self-pay | Admitting: Student

## 2024-07-06 VITALS — BP 98/60 | HR 62 | Ht 71.5 in | Wt 174.0 lb

## 2024-07-06 DIAGNOSIS — I472 Ventricular tachycardia, unspecified: Secondary | ICD-10-CM | POA: Diagnosis not present

## 2024-07-06 DIAGNOSIS — Z9189 Other specified personal risk factors, not elsewhere classified: Secondary | ICD-10-CM

## 2024-07-06 DIAGNOSIS — Z79899 Other long term (current) drug therapy: Secondary | ICD-10-CM

## 2024-07-06 LAB — CUP PACEART INCLINIC DEVICE CHECK
Date Time Interrogation Session: 20250929115902
Implantable Lead Connection Status: 753985
Implantable Lead Implant Date: 20241206
Implantable Lead Location: 753860
Implantable Pulse Generator Implant Date: 20241206

## 2024-07-06 MED ORDER — MEXILETINE HCL 200 MG PO CAPS
200.0000 mg | ORAL_CAPSULE | Freq: Two times a day (BID) | ORAL | 3 refills | Status: AC
  Filled 2024-07-06 – 2024-11-04 (×2): qty 180, 90d supply, fill #0

## 2024-07-06 NOTE — Progress Notes (Signed)
  Electrophysiology Office Note:   ID:  Al, Bracewell [birth year], MRN [id number]  Primary Cardiologist: Oneil Parchment, MD Electrophysiologist: OLE ONEIDA HOLTS, MD      History of Present Illness:   Nicholas [name] is a 68 y.o. male with h/o ICM, high risk medication monitoring - amiodarone , and MMVT seen today for routine electrophysiology followup.   Since last being seen in our clinic the patient reports doing very well. Forgets his afternoon dose of mexetil sometimes, and it makes him very anxious. Otherwise, he denies chest pain, palpitations, dyspnea, PND, orthopnea, nausea, vomiting, dizziness, syncope, edema, weight gain, or early satiety.   Review of systems complete and found to be negative unless listed in HPI.   EP Information / Studies Reviewed:    EKG is not ordered today. EKG from 09/14/2023 reviewed which showed NSR at 75 bpm       ICD Interrogation-  reviewed in detail today,  See PACEART report.  Arrhythmia/Device History CARELINK ICD MEDTRONIC   Physical Exam:   VS:  BP 98/60   Pulse 62   Ht 5' 11.5 (1.816 m)   Wt 174 lb (78.9 kg)   SpO2 97%   BMI 23.93 kg/m    Wt Readings from Last 3 Encounters:  07/06/24 174 lb (78.9 kg)  03/23/24 175 lb (79.4 kg)  12/26/23 187 lb 3.2 oz (84.9 kg)     GEN: No acute distress  NECK: No JVD; No carotid bruits CARDIAC: Regular rate and rhythm, no murmurs, rubs, gallops RESPIRATORY:  Clear to auscultation without rales, wheezing or rhonchi  ABDOMEN: Soft, non-tender, non-distended EXTREMITIES:  No edema; No deformity   ASSESSMENT AND PLAN:    Ventricular arrhythmia  s/p Medtronic single chamber ICD  euvolemic today Stable on an appropriate medical regimen Normal ICD function See Pace Art report No changes today Continue amiodarone  200 mg daily. We discussed decreasing further, but pt would prefer to defer for now.  Change mexitil  to 200 mg BID; Pt was forgetting afternoon dose frequently.  Surveillance labs  today.    CAD No s/s of ischemia.      Disposition:   Follow up with EP Team in 6 months   Signed, Ozell Prentice Passey, PA-C

## 2024-07-06 NOTE — Patient Instructions (Signed)
 Medication Instructions:  Change mexiletine to 200 mg twice daily *If you need a refill on your cardiac medications before your next appointment, please call your pharmacy*  Lab Work: TODAY-CMET, TSH, FreeT4 If you have labs (blood work) drawn today and your tests are completely normal, you will receive your results only by: MyChart Message (if you have MyChart) OR A paper copy in the mail If you have any lab test that is abnormal or we need to change your treatment, we will call you to review the results.  Follow-Up: At [facility], you and your health needs are our priority.  As part of our continuing mission to provide you with exceptional heart care, our providers are all part of one team.  This team includes your primary Cardiologist (physician) and Advanced Practice Providers or APPs (Physician Assistants and Nurse Practitioners) who all work together to provide you with the care you need, when you need it.  Your next appointment:   6 month(s)  Provider:   Ozell Jodie Passey, PA-C    We recommend signing up for the patient portal called MyChart.  Sign up information is provided on this After Visit Summary.  MyChart is used to connect with patients for Virtual Visits (Telemedicine).  Patients are able to view lab/test results, encounter notes, upcoming appointments, etc.  Non-urgent messages can be sent to your provider as well.   To learn more about what you can do with MyChart, go to [email or web address].

## 2024-07-07 ENCOUNTER — Ambulatory Visit: Payer: Self-pay | Admitting: Student

## 2024-07-07 LAB — COMPREHENSIVE METABOLIC PANEL WITH GFR
ALT: 23 IU/L (ref 0–44)
AST: 25 IU/L (ref 0–40)
Albumin: 3.7 g/dL — ABNORMAL LOW (ref 3.9–4.9)
Alkaline Phosphatase: 63 IU/L (ref 47–123)
BUN/Creatinine Ratio: 11 (ref 10–24)
BUN: 17 mg/dL (ref 8–27)
Bilirubin Total: <0.2 mg/dL (ref 0.0–1.2)
CO2: 18 mmol/L — ABNORMAL LOW (ref 20–29)
Calcium: 8.5 mg/dL — ABNORMAL LOW (ref 8.6–10.2)
Chloride: 106 mmol/L (ref 96–106)
Creatinine, Ser: 1.58 mg/dL — ABNORMAL HIGH (ref 0.76–1.27)
Globulin, Total: 2.6 g/dL (ref 1.5–4.5)
Glucose: 80 mg/dL (ref 70–99)
Potassium: 4.9 mmol/L (ref 3.5–5.2)
Sodium: 135 mmol/L (ref 134–144)
Total Protein: 6.3 g/dL (ref 6.0–8.5)
eGFR: 47 mL/min/1.73 — ABNORMAL LOW (ref >59)

## 2024-07-07 LAB — TSH: TSH: 0.593 u[IU]/mL (ref 0.450–4.500)

## 2024-07-07 LAB — T4, FREE: Free T4: 1.28 ng/dL (ref 0.82–1.77)

## 2024-07-13 ENCOUNTER — Other Ambulatory Visit: Payer: Self-pay | Admitting: Student

## 2024-07-13 DIAGNOSIS — I472 Ventricular tachycardia, unspecified: Secondary | ICD-10-CM

## 2024-07-14 ENCOUNTER — Other Ambulatory Visit (HOSPITAL_COMMUNITY): Payer: Self-pay

## 2024-07-14 MED ORDER — AMIODARONE HCL 200 MG PO TABS
200.0000 mg | ORAL_TABLET | Freq: Every day | ORAL | 3 refills | Status: AC
  Filled 2024-07-14: qty 90, 90d supply, fill #0
  Filled 2024-10-12: qty 90, 90d supply, fill #1

## 2024-07-20 DIAGNOSIS — Z125 Encounter for screening for malignant neoplasm of prostate: Secondary | ICD-10-CM | POA: Diagnosis not present

## 2024-07-20 DIAGNOSIS — I2510 Atherosclerotic heart disease of native coronary artery without angina pectoris: Secondary | ICD-10-CM | POA: Diagnosis not present

## 2024-07-20 DIAGNOSIS — I471 Supraventricular tachycardia, unspecified: Secondary | ICD-10-CM | POA: Diagnosis not present

## 2024-07-20 DIAGNOSIS — I10 Essential (primary) hypertension: Secondary | ICD-10-CM | POA: Diagnosis not present

## 2024-07-22 DIAGNOSIS — Z0000 Encounter for general adult medical examination without abnormal findings: Secondary | ICD-10-CM | POA: Diagnosis not present

## 2024-07-22 DIAGNOSIS — I255 Ischemic cardiomyopathy: Secondary | ICD-10-CM | POA: Diagnosis not present

## 2024-07-22 DIAGNOSIS — N400 Enlarged prostate without lower urinary tract symptoms: Secondary | ICD-10-CM | POA: Diagnosis not present

## 2024-07-22 DIAGNOSIS — I10 Essential (primary) hypertension: Secondary | ICD-10-CM | POA: Diagnosis not present

## 2024-07-22 DIAGNOSIS — I471 Supraventricular tachycardia, unspecified: Secondary | ICD-10-CM | POA: Diagnosis not present

## 2024-07-22 DIAGNOSIS — Z95810 Presence of automatic (implantable) cardiac defibrillator: Secondary | ICD-10-CM | POA: Diagnosis not present

## 2024-07-22 DIAGNOSIS — Z23 Encounter for immunization: Secondary | ICD-10-CM | POA: Diagnosis not present

## 2024-07-22 DIAGNOSIS — I2510 Atherosclerotic heart disease of native coronary artery without angina pectoris: Secondary | ICD-10-CM | POA: Diagnosis not present

## 2024-07-22 DIAGNOSIS — E7800 Pure hypercholesterolemia, unspecified: Secondary | ICD-10-CM | POA: Diagnosis not present

## 2024-07-22 DIAGNOSIS — Z125 Encounter for screening for malignant neoplasm of prostate: Secondary | ICD-10-CM | POA: Diagnosis not present

## 2024-07-27 ENCOUNTER — Other Ambulatory Visit: Payer: Self-pay | Admitting: Cardiology

## 2024-07-27 ENCOUNTER — Other Ambulatory Visit (HOSPITAL_COMMUNITY): Payer: Self-pay

## 2024-07-28 ENCOUNTER — Other Ambulatory Visit (HOSPITAL_COMMUNITY): Payer: Self-pay

## 2024-07-28 MED ORDER — ROSUVASTATIN CALCIUM 40 MG PO TABS
40.0000 mg | ORAL_TABLET | Freq: Every day | ORAL | 1 refills | Status: DC
  Filled 2024-07-28: qty 90, 90d supply, fill #0

## 2024-07-29 ENCOUNTER — Other Ambulatory Visit (HOSPITAL_COMMUNITY): Payer: Self-pay

## 2024-07-30 ENCOUNTER — Other Ambulatory Visit (HOSPITAL_COMMUNITY): Payer: Self-pay

## 2024-08-04 NOTE — Progress Notes (Signed)
 Reason for visit: BPH with LUTS. Prostate embolization at the request of [name],[name]   Care Team(s) Primary Care: Leonel Cole, MD Cardiology: Jeffrie Oneil BROCKS, MD  Urology: Carolee Beagle, MD   History of Present Illness:   [name] is a 68 y.o. male comorbid w PMHx significant for CAD s/p CABG x3, HTN, VT, CHF and BPH. Pt reports recent acute urinary obstruction episode requiring ER visit and foley catheterization (03/23/24), which he kept for a week. UA was positive for E. coli. He is joined in the visit by his spouse, Nicholas [name], who is a reliable historian and reports that he has been treated for BPH w alpha blockade x79yrs. He reports increasing urinary urgency, nocturia up to 10X/night and a reduced stream.  He was on medical management with alfuzosin  10 mg every day, and denied hematuria.  He underwent prostate artery embolization on 06/22/24 and noticed expected swelling and catheter-related discomfort but did well otherwise psot procedure. He has noticed that his urinary symptoms have mildly improved. He remains on medical management with alfuzosin  10 mg QD.    I used a International Prostatism Symptom Score* (IPSS) Score to quantify his symptoms: 14 point, previously 23 (pre PAE)   *Wadie MJ, Ashley GARNELL Mickey Valorie MP, et al. The American Urological Association symptom index for benign prostatic hyperplasia. The Measurement Committee of the American Urological Association. J Urol [id number]; 851:8450.   Review of Systems: A 12 point ROS discussed and pertinent positives are indicated in the HPI above.  All other systems are negative.    Past Medical History:  Diagnosis Date   Arthritis    knees, hands   Cataract    Coronary artery disease    Dysrhythmia 06/2014   sinus brady with PVC'S   GERD (gastroesophageal reflux disease)    Hepatitis C    Hx of adenomatous colonic polyps 09/21/2014   Hypercholesteremia    Hypertension    Myocardial infarction [facility]) 2024     Past Surgical History:  Procedure Laterality Date   CARDIAC CATHETERIZATION     CARDIAC SURGERY     triple bypass   COLONOSCOPY     CORONARY ARTERY BYPASS GRAFT  2000   CABG x 3 Vessels   ICD IMPLANT N/A 09/13/2023   Procedure: ICD IMPLANT;  Surgeon: Cindie Ole DASEN, MD;  Location: [facility] INVASIVE CV LAB;  Service: Cardiovascular;  Laterality: N/A;   IR 3D INDEPENDENT WKST  06/22/2024   IR ANGIOGRAM PELVIS SELECTIVE OR SUPRASELECTIVE  06/22/2024   IR ANGIOGRAM SELECTIVE EACH ADDITIONAL VESSEL  06/22/2024   IR ANGIOGRAM SELECTIVE EACH ADDITIONAL VESSEL  06/22/2024   IR ANGIOGRAM SELECTIVE EACH ADDITIONAL VESSEL  06/22/2024   IR ANGIOGRAM SELECTIVE EACH ADDITIONAL VESSEL  06/22/2024   IR EMBO TUMOR ORGAN ISCHEMIA INFARCT INC GUIDE ROADMAPPING  06/22/2024   IR RADIOLOGIST EVAL & MGMT  05/25/2024   IR RADIOLOGIST EVAL & MGMT  08/05/2024   IR US  GUIDE VASC ACCESS LEFT  06/22/2024   IR US  GUIDE VASC ACCESS RIGHT  06/22/2024   LEFT HEART CATH AND CORS/GRAFTS ANGIOGRAPHY N/A 06/19/2021   Procedure: LEFT HEART CATH AND CORS/GRAFTS ANGIOGRAPHY;  Surgeon: Claudene Victory ORN, MD;  Location: [facility] INVASIVE CV LAB;  Service: Cardiovascular;  Laterality: N/A;   LEFT HEART CATH AND CORS/GRAFTS ANGIOGRAPHY N/A 09/10/2023   Procedure: LEFT HEART CATH AND CORS/GRAFTS ANGIOGRAPHY;  Surgeon: Ladona Heinz, MD;  Location: [facility] INVASIVE CV LAB;  Service: Cardiovascular;  Laterality: N/A;  Allergies: Atorvastatin calcium  and Etomidate   Medications: Prior to Admission medications   Medication Sig Start Date End Date Taking? Authorizing Provider  alfuzosin  (UROXATRAL ) 10 MG 24 hr tablet Take 1 tablet (10 mg total) by mouth daily after the same meal every day. 04/27/24     amiodarone  (PACERONE ) 200 MG tablet Take 1 tablet (200 mg total) by mouth daily. 07/14/24   Lesia Ozell Barter, PA-C  aspirin  EC 81 MG tablet Take 81 mg by mouth daily with breakfast.    [provider]  ciprofloxacin  (CIPRO ) 500 MG tablet  Take 1 tablet (500 mg total) by mouth 2 (two) times daily. 06/22/24   Allred, Darrell K, PA-C  docusate sodium  (COLACE) 100 MG capsule Take 1 capsule (100 mg total) by mouth 2 (two) times daily. 06/22/24   Allred, Darrell K, PA-C  losartan  (COZAAR ) 25 MG tablet Take 1 tablet (25 mg total) by mouth at bedtime. 01/21/24   Jeffrie Oneil BROCKS, MD  methylPREDNISolone  (MEDROL  DOSEPAK) 4 MG TBPK tablet Take as directed 06/22/24   Allred, Darrell K, PA-C  metoprolol  succinate (TOPROL -XL) 25 MG 24 hr tablet Take 12.5 mg by mouth daily.    [provider]  mexiletine (MEXITIL ) 200 MG capsule Take 1 capsule (200 mg total) by mouth 2 (two) times daily. 07/06/24   Lesia Ozell Barter, PA-C  naproxen  (NAPROSYN ) 500 MG tablet Take 1 tablet (500 mg total) by mouth 2 (two) times daily with a meal. 06/22/24   Allred, Darrell K, PA-C  nitroGLYCERIN  (NITROSTAT ) 0.4 MG SL tablet Place 1 tablet (0.4 mg total) under the tongue every 5 (five) minutes for 3 doses as needed for chest pain. 04/23/24   Jeffrie Oneil BROCKS, MD  oxyCODONE -acetaminophen  (PERCOCET) 5-325 MG tablet Take 1 tablet by mouth every 6 (six) hours as needed for severe pain (pain score 7-10). 06/22/24   Allred, Darrell K, PA-C  phenazopyridine  (PYRIDIUM ) 100 MG tablet Take 1 tablet (100 mg total) by mouth 3 (three) times daily as needed. 06/22/24   Allred, Darrell K, PA-C  rosuvastatin  (CRESTOR ) 40 MG tablet Take 1 tablet (40 mg total) by mouth daily at 6 PM. 07/29/23   Jeffrie Oneil BROCKS, MD  rosuvastatin  (CRESTOR ) 40 MG tablet Take 1 tablet (40 mg total) by mouth daily. 07/28/24     solifenacin  (VESICARE ) 5 MG tablet Take 1 tablet (5 mg total) by mouth daily. 06/22/24   Allred, Harman POUR, PA-C     Family History  Problem Relation Age of Onset   Diabetic kidney disease Mother    Kidney disease Mother    Throat cancer Mother    Cancer Mother    Diabetes Mother    Hypertension Mother    Esophageal cancer Mother    Other Father        unknown   Hypertension  Father    Heart attack Neg Hx    Colon cancer Neg Hx    Stomach cancer Neg Hx    Rectal cancer Neg Hx    Colon polyps Neg Hx     Social History   Socioeconomic History   Marital status: Married    Spouse name: Not on file   Number of children: Not on file   Years of education: 11   Highest education level: 11th grade  Occupational History   Not on file  Tobacco Use   Smoking status: Former    Current packs/day: 0.00    Types: Cigarettes    Quit date: 06/10/2002  Years since quitting: 22.1   Smokeless tobacco: Never  Vaping Use   Vaping status: Never Used  Substance and Sexual Activity   Alcohol use: Yes    Alcohol/week: 7.0 standard drinks of alcohol    Types: 7 Cans of beer per week   Drug use: Yes    Frequency: 3.0 times per week    Types: Marijuana    Comment: occ   Sexual activity: Yes  Other Topics Concern   Not on file  Social History Narrative   Not on file   Social Drivers of Health   Financial Resource Strain: Not on file  Food Insecurity: No Food Insecurity (09/10/2023)   Hunger Vital Sign    Worried About Running Out of Food in the Last Year: Never true    Ran Out of Food in the Last Year: Never true  Transportation Needs: No Transportation Needs (09/10/2023)   PRAPARE - Administrator, Civil Service (Medical): No    Lack of Transportation (Non-Medical): No  Physical Activity: Not on file  Stress: Not on file  Social Connections: Not on file    Vital Signs: BP 136/84 (BP Location: Left Arm, Patient Position: Sitting, Cuff Size: Normal)   Pulse (!) 57   Temp 97.8 F (36.6 C) (Oral)   Resp 18   SpO2 98%   Physical Exam  General: WN, NAD  CV: RRR on monitor Pulm: normal work of breathing on RA Abd: S, ND, NT MSK: Grossly normal Psych: Appropriate affect.  Imaging:    IR PAE, 9/15/285 IMPRESSION:  1. Successful LEFT prostatic artery microparticle and coil embolization, via transfemoral approach.   2. Severe  atherosclerotic disease with critical proximal stenosis of/near-occlusion at the RIGHT internal iliac artery. Embolization was not performed on this side.  No results found.  Labs:  CBC: Recent Labs    09/12/23 0325 09/13/23 0259 09/14/23 0301 06/22/24 1218  WBC 7.9 7.2 7.3 8.7  HGB 13.3 12.8* 14.2 12.8*  HCT 39.8 38.3* 40.8 40.7  PLT 127* 114* 78* 188    COAGS: Recent Labs    06/22/24 1218  INR 1.2    BMP: Recent Labs    09/12/23 0325 09/13/23 0259 09/14/23 0301 10/14/23 1336 12/26/23 1539 04/20/24 1441 06/22/24 1218 07/06/24 1204  NA 137 138 137   < > 140 137 140 135  K 4.0 3.9 4.6   < > 4.5 4.8 4.6 4.9  CL 107 107 108   < > 106 105 109 106  CO2 22 23 20*   < > 22 19* 21* 18*  GLUCOSE 110* 99 91   < > 87 93 119* 80  BUN 13 9 13    < > 18 15 17 17   CALCIUM  9.0 9.1 8.9   < > 9.3 8.9 9.2 8.5*  CREATININE 1.37* 1.24 1.22   < > 1.38* 1.44* 1.28* 1.58*  GFRNONAA 57* >60 >60  --   --   --  >60  --    < > = values in this interval not displayed.    Assessment and Plan:  68 y/o M comorbid w PMHx significant for CAD s/p CABG x3, HTN, VT, CHF and long standing BPH. Nocturia x10 / night and recent catheterization for acute urinary retention. Now s/p L PAE 06/22/24. Nocturia decreased to 5X / night.   Prostatomegaly (76 mL) PSA, last 5.0 (04/01/24) IPSS Score Total, 14 Pts, pre PAE 23 Pts QoL, mildly improved. Remains on alfuzosin  10 mg QD     *  Pt is happy with the prostate embolization procedural result, but expects more improvement from his current urinary state.  *No concern at this time. Follow up with VIR PRN. *Continue routine Urological follow up  Thank you for allowing us  to participate in the care of this Patient. Please contact me with questions, concerns, or if new issues arise.  Electronically Signed:  Thom Hall, MD Vascular and Interventional Radiology Specialists [facility] Radiology    Pager. [phone] Clinic. [phone]   I spent a  total of 30 Minutes  in face to face in clinical consultation, greater than 50% of which was counseling/coordinating care for Mr SHISHIR KRANTZ' BPH and urinary retention

## 2024-08-05 ENCOUNTER — Ambulatory Visit
Admission: RE | Admit: 2024-08-05 | Discharge: 2024-08-05 | Disposition: A | Discharge status: Home / Self Care | Source: Ambulatory Visit | Attending: Radiology | Admitting: Radiology

## 2024-08-05 DIAGNOSIS — N138 Other obstructive and reflux uropathy: Secondary | ICD-10-CM | POA: Diagnosis not present

## 2024-08-05 DIAGNOSIS — N401 Enlarged prostate with lower urinary tract symptoms: Secondary | ICD-10-CM

## 2024-08-05 HISTORY — PX: IR RADIOLOGIST EVAL & MGMT: IMG5224

## 2024-08-05 NOTE — Progress Notes (Signed)
 Remote ICD transmission.

## 2024-08-10 ENCOUNTER — Other Ambulatory Visit: Payer: Self-pay

## 2024-08-10 ENCOUNTER — Encounter (HOSPITAL_COMMUNITY): Payer: Self-pay

## 2024-08-10 ENCOUNTER — Other Ambulatory Visit (HOSPITAL_COMMUNITY): Payer: Self-pay

## 2024-08-10 ENCOUNTER — Emergency Department (HOSPITAL_COMMUNITY)
Admission: EM | Admit: 2024-08-10 | Discharge: 2024-08-10 | Disposition: A | Discharge status: Home / Self Care | Attending: Emergency Medicine | Admitting: Emergency Medicine

## 2024-08-10 DIAGNOSIS — Z7982 Long term (current) use of aspirin: Secondary | ICD-10-CM | POA: Diagnosis not present

## 2024-08-10 DIAGNOSIS — I10 Essential (primary) hypertension: Secondary | ICD-10-CM | POA: Diagnosis not present

## 2024-08-10 DIAGNOSIS — I2510 Atherosclerotic heart disease of native coronary artery without angina pectoris: Secondary | ICD-10-CM | POA: Insufficient documentation

## 2024-08-10 DIAGNOSIS — N3001 Acute cystitis with hematuria: Secondary | ICD-10-CM | POA: Diagnosis not present

## 2024-08-10 DIAGNOSIS — R339 Retention of urine, unspecified: Secondary | ICD-10-CM

## 2024-08-10 LAB — URINALYSIS, W/ REFLEX TO CULTURE (INFECTION SUSPECTED)
Bilirubin Urine: NEGATIVE
Glucose, UA: NEGATIVE mg/dL
Ketones, ur: NEGATIVE mg/dL
Nitrite: NEGATIVE
Protein, ur: 100 mg/dL — AB
RBC / HPF: >50 RBC/hpf (ref 0–5)
Specific Gravity, Urine: 1.014 (ref 1.005–1.030)
WBC, UA: >50 WBC/hpf (ref 0–5)
pH: 5 (ref 5.0–8.0)

## 2024-08-10 MED ORDER — CEPHALEXIN 500 MG PO CAPS
500.0000 mg | ORAL_CAPSULE | Freq: Three times a day (TID) | ORAL | 0 refills | Status: AC
  Filled 2024-08-10: qty 30, 10d supply, fill #0

## 2024-08-10 MED ORDER — LIDOCAINE HCL URETHRAL/MUCOSAL 2 % EX GEL
1.0000 | Freq: Once | CUTANEOUS | Status: AC | PRN
  Administered 2024-08-10: 1 via URETHRAL
  Filled 2024-08-10: qty 11

## 2024-08-10 MED ORDER — CEPHALEXIN 500 MG PO CAPS
500.0000 mg | ORAL_CAPSULE | Freq: Once | ORAL | Status: AC
  Administered 2024-08-10: 500 mg via ORAL
  Filled 2024-08-10: qty 1

## 2024-08-10 NOTE — ED Provider Notes (Signed)
 [place] EMERGENCY DEPARTMENT AT [facility] Provider Note  CSN: [id number] Arrival date & time: 08/10/24 0545  Chief Complaint(s) Urinary Retention  HPI Nicholas [name] is a 68 y.o. male with a past medical history listed below including BPH status post prostate artery embolization on September 15.  Patient was doing well up until midnight last night when he began having suprapubic discomfort and inability to void.  Patient has had urinary retention in the past requiring Foley catheter.  Feels the same.  HPI  Past Medical History Past Medical History:  Diagnosis Date   Arthritis    knees, hands   Cataract    Coronary artery disease    Dysrhythmia 06/2014   sinus brady with PVC'S   GERD (gastroesophageal reflux disease)    Hepatitis C    Hx of adenomatous colonic polyps 09/21/2014   Hypercholesteremia    Hypertension    Myocardial infarction [facility]) 2024   Patient Active Problem List   Diagnosis Date Noted   VT (ventricular tachycardia) (HCC) 09/10/2023   Hx of CABG 09/10/2023   Long term current use of antiarrhythmic drug 09/10/2023   Acute renal insufficiency 09/10/2023   Benign hypertension 09/10/2023   Mixed hyperlipidemia 09/10/2023   Palpitations 09/10/2023   Dyspnea on exertion 09/10/2023   Atherosclerosis of native coronary artery of native heart without angina pectoris 09/10/2023   NSVT (nonsustained ventricular tachycardia) (HCC) 06/18/2021   NSTEMI (non-ST elevated myocardial infarction) (HCC) 06/16/2021   Angina at rest 06/16/2021   Liver fibrosis 12/19/2015   Chronic hepatitis C without hepatic coma (HCC) 04/13/2015   Hx of adenomatous colonic polyps 09/21/2014   CAD- CABG x 3 in [place] '03 05/21/2014   Dizziness when bending over 05/21/2014   Frequent PVCs 05/21/2014   Sinus bradycardia 05/21/2014   Chronic renal insufficiency, stage II (mild) 05/21/2014   Dyslipidemia 05/21/2014   Near syncope 05/21/2014   Home Medication(s) Prior to  Admission medications   Medication Sig Start Date End Date Taking? Authorizing Provider  cephALEXin  (KEFLEX ) 500 MG capsule Take 1 capsule (500 mg total) by mouth 3 (three) times daily for 10 days. 08/10/24 08/20/24 Yes Tuyet Bader, Raynell Moder, MD  alfuzosin  (UROXATRAL ) 10 MG 24 hr tablet Take 1 tablet (10 mg total) by mouth daily after the same meal every day. 04/27/24     amiodarone  (PACERONE ) 200 MG tablet Take 1 tablet (200 mg total) by mouth daily. 07/14/24   Lesia Ozell Barter, PA-C  aspirin  EC 81 MG tablet Take 81 mg by mouth daily with breakfast.    [provider]  ciprofloxacin  (CIPRO ) 500 MG tablet Take 1 tablet (500 mg total) by mouth 2 (two) times daily. 06/22/24   Allred, Darrell K, PA-C  docusate sodium  (COLACE) 100 MG capsule Take 1 capsule (100 mg total) by mouth 2 (two) times daily. 06/22/24   Allred, Darrell K, PA-C  losartan  (COZAAR ) 25 MG tablet Take 1 tablet (25 mg total) by mouth at bedtime. 01/21/24   Jeffrie Oneil BROCKS, MD  methylPREDNISolone  (MEDROL  DOSEPAK) 4 MG TBPK tablet Take as directed 06/22/24   Allred, Darrell K, PA-C  metoprolol  succinate (TOPROL -XL) 25 MG 24 hr tablet Take 12.5 mg by mouth daily.    [provider]  mexiletine (MEXITIL ) 200 MG capsule Take 1 capsule (200 mg total) by mouth 2 (two) times daily. 07/06/24   Lesia Ozell Barter, PA-C  naproxen  (NAPROSYN ) 500 MG tablet Take 1 tablet (500 mg total) by mouth 2 (two) times daily with  a meal. 06/22/24   Allred, Darrell K, PA-C  nitroGLYCERIN  (NITROSTAT ) 0.4 MG SL tablet Place 1 tablet (0.4 mg total) under the tongue every 5 (five) minutes for 3 doses as needed for chest pain. 04/23/24   Jeffrie Oneil BROCKS, MD  oxyCODONE -acetaminophen  (PERCOCET) 5-325 MG tablet Take 1 tablet by mouth every 6 (six) hours as needed for severe pain (pain score 7-10). 06/22/24   Allred, Darrell K, PA-C  phenazopyridine  (PYRIDIUM ) 100 MG tablet Take 1 tablet (100 mg total) by mouth 3 (three) times daily as needed. 06/22/24    Allred, Darrell K, PA-C  rosuvastatin  (CRESTOR ) 40 MG tablet Take 1 tablet (40 mg total) by mouth daily at 6 PM. 07/29/23   Jeffrie Oneil BROCKS, MD  rosuvastatin  (CRESTOR ) 40 MG tablet Take 1 tablet (40 mg total) by mouth daily. 07/28/24     solifenacin  (VESICARE ) 5 MG tablet Take 1 tablet (5 mg total) by mouth daily. 06/22/24   Allred, Darrell K, PA-C                                                                                                                                    Allergies Atorvastatin calcium  and Etomidate   Review of Systems Review of Systems As noted in HPI  Physical Exam Vital Signs  I have reviewed the triage vital signs BP (!) 145/96   Pulse 60   Temp (!) 97.5 F (36.4 C) (Oral)   Resp 16   SpO2 100%   Physical Exam Vitals reviewed.  Constitutional:      General: He is not in acute distress.    Appearance: He is well-developed. He is not diaphoretic.  HENT:     Head: Normocephalic and atraumatic.     Right Ear: External ear normal.     Left Ear: External ear normal.     Nose: Nose normal.     Mouth/Throat:     Mouth: Mucous membranes are moist.  Eyes:     General: No scleral icterus.    Conjunctiva/sclera: Conjunctivae normal.  Neck:     Trachea: Phonation normal.  Cardiovascular:     Rate and Rhythm: Normal rate and regular rhythm.  Pulmonary:     Effort: Pulmonary effort is normal. No respiratory distress.     Breath sounds: No stridor.  Abdominal:     General: There is no distension.  Musculoskeletal:        General: Normal range of motion.     Cervical back: Normal range of motion.  Neurological:     Mental Status: He is alert and oriented to person, place, and time.  Psychiatric:        Behavior: Behavior normal.     ED Results and Treatments Labs (all labs ordered are listed, but only abnormal results are displayed) Labs Reviewed  URINALYSIS, W/ REFLEX TO CULTURE (INFECTION SUSPECTED) - Abnormal; Notable for the following components:  Result Value   APPearance CLOUDY (*)    Hgb urine dipstick MODERATE (*)    Protein, ur 100 (*)    Leukocytes,Ua MODERATE (*)    Bacteria, UA FEW (*)    All other components within normal limits  URINE CULTURE                                                                                                                         EKG  EKG Interpretation Date/Time:    Ventricular Rate:    PR Interval:    QRS Duration:    QT Interval:    QTC Calculation:   R Axis:      Text Interpretation:         Radiology No results found.  Medications Ordered in ED Medications  lidocaine  (XYLOCAINE ) 2 % jelly 1 Application (1 Application Urethral Given 08/10/24 0617)  cephALEXin  (KEFLEX ) capsule 500 mg (500 mg Oral Given 08/10/24 9352)   Procedures Procedures  (including critical care time) Medical Decision Making / ED Course   Medical Decision Making Amount and/or Complexity of Data Reviewed Labs: ordered. Decision-making details documented in ED Course.  Risk Prescription drug management.    Urinary retention.  Bladder scan was greater than 500 cc.  Foley catheter inserted and backfilled with over 800 cc.  UA questionable for infection.  Patient grew out E. coli on his last urine culture in June sensitive to cephalosporins.  Will treat for possible urinary tract infection with Keflex .  First dose given in the emergency department.  Will send cultures to confirm.  Urology follow-up.    Final Clinical Impression(s) / ED Diagnoses Final diagnoses:  Urinary retention  Acute cystitis with hematuria   The patient appears reasonably screened and/or stabilized for discharge and I doubt any other medical condition or other [facility] requiring further screening, evaluation, or treatment in the ED at this time. I have discussed the findings, Dx and Tx plan with the patient/family who expressed understanding and agree(s) with the plan. Discharge instructions discussed at length. The  patient/family was given strict return precautions who verbalized understanding of the instructions. No further questions at time of discharge.  Disposition: Discharge  Condition: Good  ED Discharge Orders          Ordered    cephALEXin  (KEFLEX ) 500 MG capsule  3 times daily        08/10/24 9356             Follow Up: Alvaro Ricardo KATHEE Mickey., MD [address] [place] [address] [phone]  Call  to schedule an appointment for close follow up  Leonel Cole, MD [address] [place] [address] [phone]  Call  to schedule an appointment for close follow up    This chart was dictated using voice recognition software.  Despite best efforts to proofread,  errors can occur which can change the documentation meaning.    Trine Raynell Moder, MD 08/10/24 [phone]

## 2024-08-10 NOTE — ED Notes (Signed)
 Delay on bladder scan PT currently in restroom

## 2024-08-10 NOTE — ED Notes (Signed)
 Per lab, cx was already added onto the urine.

## 2024-08-10 NOTE — ED Triage Notes (Signed)
 Pt had recent prostate surgery in september on the 15th, it was done here from what the wife says, he mentions everything has been good since the surgery but since midnight he has had voiding urgency and feeling on not being able to completely empty his bladder. No fevers at home, otherwise stable with no chest pain or shortness of breath.

## 2024-08-11 LAB — URINE CULTURE: Culture: <10000 — AB

## 2024-08-19 ENCOUNTER — Ambulatory Visit: Attending: Student in an Organized Health Care Education/Training Program | Admitting: Cardiology

## 2024-08-19 ENCOUNTER — Encounter: Payer: Self-pay | Admitting: Cardiology

## 2024-08-19 VITALS — BP 138/80 | HR 52 | Ht 71.5 in | Wt 180.0 lb

## 2024-08-19 DIAGNOSIS — I4729 Other ventricular tachycardia: Secondary | ICD-10-CM

## 2024-08-19 DIAGNOSIS — I2583 Coronary atherosclerosis due to lipid rich plaque: Secondary | ICD-10-CM

## 2024-08-19 DIAGNOSIS — I2510 Atherosclerotic heart disease of native coronary artery without angina pectoris: Secondary | ICD-10-CM

## 2024-08-19 DIAGNOSIS — I472 Ventricular tachycardia, unspecified: Secondary | ICD-10-CM | POA: Diagnosis not present

## 2024-08-19 DIAGNOSIS — Z79899 Other long term (current) drug therapy: Secondary | ICD-10-CM | POA: Diagnosis not present

## 2024-08-19 NOTE — Progress Notes (Signed)
 Cardiology Office Note:  .   Date:  08/19/2024  ID:  Nicholas [name], DOB [birth year], MRN [id number] PCP: Leonel Cole, MD  [facility] Providers Cardiologist:  Oneil Parchment, MD Electrophysiologist:  OLE ONEIDA HOLTS, MD    History of Present Illness: .   Nicholas [name] is a 68 y.o. male Discussed the use of AI scribe   History of Present Illness Nicholas [name] is a 68 year old male with ventricular tachycardia and an ICD who presents for follow-up.  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  He is currently on amiodarone  200 mg daily, mexiletine 200 mg twice a day (Monday through Friday), metoprolol  12.5 mg, losartan  25 mg, and Crestor  40 mg. His LDL is 51 on Crestor . He experiences occasional episodes of racing heart but not often. No chest pain during physical activity.  His echocardiogram in 2024 showed an ejection fraction of 55% with mild dilation of the ascending aorta at 40 mm. A prior cardiac MRI in 2022 showed an EF of 40.2%.  Creatinine levels have ranged from 1.3 to 1.5. Recent labs in September included liver and thyroid  function tests.       Studies Reviewed: SABRA   EKG Interpretation Date/Time:  Wednesday August 19 2024 15:12:32 EST Ventricular Rate:  52 PR Interval:  224 QRS Duration:  110 QT Interval:  476 QTC Calculation: 442 R Axis:   55  Text Interpretation: Sinus bradycardia with 1st degree A-V block T wave abnormality, consider inferior ischemia When compared with ECG of 14-Sep-2023 05:32, Sinus rhythm has replaced Ectopic atrial rhythm QRS axis Shifted right Non-specific change in ST segment in Anterior leads T wave inversion now evident in Inferior leads T wave inversion no longer evident in Anterolateral leads Confirmed by Parchment Oneil  [phone]) on 08/19/2024 3:14:42 PM    Results LABS Creatinine: 1.5, 1.3, 1.4  RADIOLOGY Cardiac MRI: EF 40%. (2022)  DIAGNOSTIC Cardiac catheterization: Trifurcation lesion to LAD, widely patent LAD with total occlusion of the first large diagonal branch. Widely patent vein graft to first diagonal. (2022) Echocardiogram: EF 55%, mild dilation of ascending aorta 40 mm. (2024) EKG: Normal. (08/19/2024) Risk Assessment/Calculations:            Physical Exam:   VS:  BP 138/80   Pulse (!) 52   Ht 5' 11.5 (1.816 m)   Wt 180 lb (81.6 kg)   BMI 24.76 kg/m    Wt Readings from Last 3 Encounters:  08/19/24 180 lb (81.6 kg)  07/06/24 174 lb (78.9 kg)  03/23/24 175 lb (79.4 kg)    GEN: Well nourished, well developed in no acute distress NECK: No JVD; No carotid bruits CARDIAC: RRR, no murmurs, no rubs, no gallops RESPIRATORY:  Clear to auscultation without rales, wheezing or rhonchi  ABDOMEN: Soft, non-tender, non-distended EXTREMITIES:  No edema; No deformity   ASSESSMENT AND PLAN: .    Assessment and Plan Assessment & Plan Non-sustained ventricular tachycardia, status post ICD Non-sustained ventricular tachycardia is well-controlled with amiodarone  and mexiletine. Occasional palpitations reported but not frequent. EKG is normal. - Continue amiodarone  200 mg daily -labs reviewed, liver function and thyroid  normal.  High risk medication management. - Continue mexiletine 200 mg twice daily, except on weekends  Atherosclerotic heart disease of  native coronary artery, status post CABG Status post CABG in 2003. Recent cardiac catheterization in 2022 showed patent LAD and vein graft to first diagonal. LDL is well-controlled at 51 mg/dL on Crestor  40 mg daily. - Continue Crestor  40 mg daily  Mildly reduced ejection fraction, now improved Ejection fraction improved from 42% to 55% as per echocardiogram in 2024. Heart function is stable with current medication regimen. - Continue current  medication regimen  Mildly decreased kidney function Creatinine levels fluctuating between 1.3 to 1.5 mg/dL. Kidney function is slightly sluggish but not dangerous. - Continue monitoring kidney function  Mild dilation of ascending aorta Mild dilation of ascending aorta noted on echocardiogram. No acute changes or symptoms reported.          Dispo: 6 mths APP  Signed, Oneil Parchment, MD

## 2024-08-19 NOTE — Patient Instructions (Signed)
 Medication Instructions:  The current medical regimen is effective;  continue present plan and medications.  *If you need a refill on your cardiac medications before your next appointment, please call your pharmacy*  Follow-Up: At [facility], you and your health needs are our priority.  As part of our continuing mission to provide you with exceptional heart care, our providers are all part of one team.  This team includes your primary Cardiologist (physician) and Advanced Practice Providers or APPs (Physician Assistants and Nurse Practitioners) who all work together to provide you with the care you need, when you need it.  Your next appointment:   6 month(s)  Provider:   Dorothye Gathers, MD    We recommend signing up for the patient portal called "MyChart".  Sign up information is provided on this After Visit Summary.  MyChart is used to connect with patients for Virtual Visits (Telemedicine).  Patients are able to view lab/test results, encounter notes, upcoming appointments, etc.  Non-urgent messages can be sent to your provider as well.   To learn more about what you can do with MyChart, go to [email or web address].

## 2024-08-20 DIAGNOSIS — I10 Essential (primary) hypertension: Secondary | ICD-10-CM | POA: Diagnosis not present

## 2024-09-15 ENCOUNTER — Ambulatory Visit: Payer: Commercial Managed Care - PPO

## 2024-09-15 DIAGNOSIS — I472 Ventricular tachycardia, unspecified: Secondary | ICD-10-CM | POA: Diagnosis not present

## 2024-09-16 LAB — CUP PACEART REMOTE DEVICE CHECK
Battery Remaining Longevity: 156 mo
Battery Voltage: 3.05 V
Brady Statistic RA Percent Paced: INVALID
Brady Statistic RV Percent Paced: 0.45 %
Date Time Interrogation Session: 20251209031739
HighPow Impedance: 47 Ohm
Implantable Lead Connection Status: 753985
Implantable Lead Implant Date: 20241206
Implantable Lead Location: 753860
Implantable Pulse Generator Implant Date: 20241206
Lead Channel Impedance Value: 285 Ohm
Lead Channel Impedance Value: 342 Ohm
Lead Channel Pacing Threshold Amplitude: 0.75 V
Lead Channel Pacing Threshold Pulse Width: 0.4 ms
Lead Channel Sensing Intrinsic Amplitude: 5 mV
Lead Channel Setting Pacing Amplitude: 2 V
Lead Channel Setting Pacing Pulse Width: 0.4 ms
Lead Channel Setting Sensing Sensitivity: 0.3 mV

## 2024-09-22 NOTE — Progress Notes (Signed)
 Remote ICD Transmission

## 2024-09-25 ENCOUNTER — Ambulatory Visit: Payer: Self-pay | Admitting: Cardiovascular Disease

## 2024-09-25 DIAGNOSIS — N50812 Left testicular pain: Secondary | ICD-10-CM | POA: Diagnosis not present

## 2024-09-25 DIAGNOSIS — N401 Enlarged prostate with lower urinary tract symptoms: Secondary | ICD-10-CM | POA: Diagnosis not present

## 2024-09-25 DIAGNOSIS — R3914 Feeling of incomplete bladder emptying: Secondary | ICD-10-CM | POA: Diagnosis not present

## 2024-10-12 ENCOUNTER — Other Ambulatory Visit (HOSPITAL_COMMUNITY): Payer: Self-pay

## 2024-10-20 ENCOUNTER — Other Ambulatory Visit (HOSPITAL_COMMUNITY): Payer: Self-pay

## 2024-10-20 MED ORDER — ROSUVASTATIN CALCIUM 40 MG PO TABS
40.0000 mg | ORAL_TABLET | Freq: Every day | ORAL | 1 refills | Status: AC
  Filled 2024-10-20: qty 90, 90d supply, fill #0

## 2024-11-04 ENCOUNTER — Other Ambulatory Visit (HOSPITAL_COMMUNITY): Payer: Self-pay

## 2024-11-04 ENCOUNTER — Other Ambulatory Visit: Payer: Self-pay | Admitting: Student

## 2024-11-04 DIAGNOSIS — I10 Essential (primary) hypertension: Secondary | ICD-10-CM

## 2024-11-04 MED ORDER — ALFUZOSIN HCL ER 10 MG PO TB24
10.0000 mg | ORAL_TABLET | Freq: Every day | ORAL | 0 refills | Status: AC
  Filled 2024-11-04: qty 90, 90d supply, fill #0

## 2024-11-06 ENCOUNTER — Telehealth: Payer: Self-pay | Admitting: Cardiology

## 2024-11-06 ENCOUNTER — Other Ambulatory Visit (HOSPITAL_COMMUNITY): Payer: Self-pay

## 2024-11-06 MED ORDER — METOPROLOL SUCCINATE ER 25 MG PO TB24
12.5000 mg | ORAL_TABLET | Freq: Every day | ORAL | 3 refills | Status: AC
  Filled 2024-11-06: qty 45, 90d supply, fill #0

## 2024-11-06 NOTE — Telephone Encounter (Signed)
*  STAT* If patient is at the pharmacy, call can be transferred to refill team.   1. Which medications need to be refilled? (please list name of each medication and dose if known)  metoprolol  succinate (TOPROL -XL) 25 MG 24 hr tablet    2. Would you like to learn more about the convenience, safety, & potential cost savings by using the [facility]?     3. Are you open to using the [facility] (Type [facility].  ).   4. Which pharmacy/location (including street and city if local pharmacy) is medication to be sent to? [place] [facility]    5. Do they need a 30 day or 90 day supply? 90 day

## 2024-11-09 NOTE — Telephone Encounter (Signed)
"  Refill had already been sent   "

## 2024-11-10 ENCOUNTER — Other Ambulatory Visit (HOSPITAL_COMMUNITY): Payer: Self-pay
